# Patient Record
Sex: Male | Born: 1990
Health system: Southern US, Community
[De-identification: ages and names within clinical notes are randomized; demographics above are authoritative.]

## PROBLEM LIST (undated history)

## (undated) DIAGNOSIS — C801 Malignant (primary) neoplasm, unspecified: Secondary | ICD-10-CM

## (undated) DIAGNOSIS — K219 Gastro-esophageal reflux disease without esophagitis: Secondary | ICD-10-CM

## (undated) DIAGNOSIS — I10 Essential (primary) hypertension: Secondary | ICD-10-CM

---

## 2020-01-23 ENCOUNTER — Other Ambulatory Visit: Payer: Self-pay

## 2020-01-23 ENCOUNTER — Emergency Department (HOSPITAL_COMMUNITY): Payer: HRSA Program

## 2020-01-23 ENCOUNTER — Encounter (HOSPITAL_COMMUNITY): Payer: Self-pay | Admitting: Emergency Medicine

## 2020-01-23 ENCOUNTER — Emergency Department (HOSPITAL_COMMUNITY)
Admission: EM | Admit: 2020-01-23 | Discharge: 2020-01-23 | Disposition: A | Discharge status: Home / Self Care | Payer: HRSA Program | Attending: Emergency Medicine | Admitting: Emergency Medicine

## 2020-01-23 DIAGNOSIS — U071 COVID-19: Secondary | ICD-10-CM | POA: Diagnosis not present

## 2020-01-23 DIAGNOSIS — R05 Cough: Secondary | ICD-10-CM | POA: Diagnosis present

## 2020-01-23 LAB — POC SARS CORONAVIRUS 2 AG -  ED: SARS Coronavirus 2 Ag: POSITIVE — AB

## 2020-01-23 MED ORDER — ALBUTEROL SULFATE HFA 108 (90 BASE) MCG/ACT IN AERS
4.0000 | INHALATION_SPRAY | Freq: Once | RESPIRATORY_TRACT | Status: AC
  Administered 2020-01-23: 4 via RESPIRATORY_TRACT
  Filled 2020-01-23: qty 6.7

## 2020-01-23 MED ORDER — DM-GUAIFENESIN ER 30-600 MG PO TB12
1.0000 | ORAL_TABLET | Freq: Two times a day (BID) | ORAL | 0 refills | Status: DC
Start: 2020-01-23 — End: 2020-10-15

## 2020-01-23 MED ORDER — ACETAMINOPHEN ER 650 MG PO TBCR
650.0000 mg | EXTENDED_RELEASE_TABLET | Freq: Three times a day (TID) | ORAL | 0 refills | Status: DC | PRN
Start: 2020-01-23 — End: 2020-11-22

## 2020-01-23 MED ORDER — ONDANSETRON 8 MG PO TBDP
8.0000 mg | ORAL_TABLET | Freq: Three times a day (TID) | ORAL | 0 refills | Status: DC | PRN
Start: 2020-01-23 — End: 2020-10-15

## 2020-01-23 MED ORDER — ASPIRIN 81 MG PO CHEW: 81.0000 mg | CHEWABLE_TABLET | Freq: Every day | ORAL | 0 refills | Status: DC

## 2020-01-23 MED ORDER — ACETAMINOPHEN 325 MG PO TABS
650.0000 mg | ORAL_TABLET | Freq: Once | ORAL | Status: AC | PRN
  Administered 2020-01-23: 650 mg via ORAL
  Filled 2020-01-23: qty 2

## 2020-01-23 NOTE — ED Provider Notes (Signed)
[facility] EMERGENCY DEPARTMENT Provider Note   CSN: [id number] Arrival date & time: 01/23/20  [id number]     History Chief Complaint  Patient presents with  . Cough  . Fever    Steven Martinez is a 29 y.o. male.  HPI    Translation service was utilized.  29 year old male comes in a chief complaint of cough. Patient reports that he has been sick for the last 3 days.  Speaking with his wife, it seems like patient has been sick for more than a week, however he started getting worse over the last 2 3 days.  Patient is complaining of cough, body aches, fevers and chills.  No nausea, vomiting, diarrhea.  Patient denies any sick contacts.  Wife works at a facility where she is required to be tested daily, and she states that her test has been negative.  History reviewed. No pertinent past medical history.  There are no problems to display for this patient.   History reviewed. No pertinent surgical history.     No family history on file.  Social History   Tobacco Use  . Smoking status: Not on file  Substance Use Topics  . Alcohol use: Not on file  . Drug use: Not on file    Home Medications Prior to Admission medications   Medication Sig Start Date End Date Taking? Authorizing Provider  acetaminophen (TYLENOL 8 HOUR) 650 MG CR tablet Take 1 tablet (650 mg total) by mouth every 8 (eight) hours as needed for pain or fever. 01/23/20   Varney Biles, MD  aspirin 81 MG chewable tablet Chew 1 tablet (81 mg total) by mouth daily. 01/23/20   Varney Biles, MD  dextromethorphan-guaiFENesin (MUCINEX DM) 30-600 MG 12hr tablet Take 1 tablet by mouth 2 (two) times daily. 01/23/20   Varney Biles, MD  ondansetron (ZOFRAN ODT) 8 MG disintegrating tablet Take 1 tablet (8 mg total) by mouth every 8 (eight) hours as needed for nausea. 01/23/20   Varney Biles, MD    Allergies    Patient has no known allergies.  Review of Systems   Review of Systems  Constitutional:  Positive for activity change.  Respiratory: Positive for cough. Negative for shortness of breath.   Cardiovascular: Negative for chest pain.  Gastrointestinal: Negative for nausea and vomiting.  Musculoskeletal: Positive for arthralgias and myalgias.  Allergic/Immunologic: Negative for immunocompromised state.  All other systems reviewed and are negative.   Physical Exam Updated Vital Signs BP 116/83   Pulse 85   Temp (!) 101.7 F (38.7 C) (Oral)   Resp 18   Wt 77.1 kg   SpO2 99%   Physical Exam Vitals and nursing note reviewed.  Constitutional:      General: He is not in acute distress.    Appearance: He is well-developed. He is diaphoretic. He is not ill-appearing.  HENT:     Head: Atraumatic.  Cardiovascular:     Rate and Rhythm: Normal rate.  Pulmonary:     Effort: Pulmonary effort is normal.     Breath sounds: No wheezing.  Musculoskeletal:     Cervical back: Neck supple.  Skin:    General: Skin is warm.  Neurological:     Mental Status: He is alert and oriented to person, place, and time.     ED Results / Procedures / Treatments   Labs (all labs ordered are listed, but only abnormal results are displayed) Labs Reviewed  POC SARS CORONAVIRUS 2 AG -  ED -  Abnormal; Notable for the following components:      Result Value   SARS Coronavirus 2 Ag POSITIVE (*)    All other components within normal limits    EKG None  Radiology DG Chest 2 View  Result Date: 01/23/2020 CLINICAL DATA:  Cough, fever for 3 days. Additional history provided: Patient reports cough, fever, body aches for 3 days. EXAM: CHEST - 2 VIEW COMPARISON:  No pertinent prior studies available for comparison. FINDINGS: Heart size within normal limits. There is no appreciable airspace consolidation. No evidence of pleural effusion or pneumothorax. No acute bony abnormality identified. IMPRESSION: No evidence of acute cardiopulmonary abnormality. Electronically Signed   By: Kellie Simmering DO   On:  01/23/2020 09:08    Procedures Procedures (including critical care time)  Medications Ordered in ED Medications  albuterol (VENTOLIN HFA) 108 (90 Base) MCG/ACT inhaler 4 puff (has no administration in time range)  acetaminophen (TYLENOL) tablet 650 mg (650 mg Oral Given 01/23/20 0856)    ED Course  I have reviewed the triage vital signs and the nursing notes.  Pertinent labs & imaging results that were available during my care of the patient were reviewed by me and considered in my medical decision making (see chart for details).    MDM Rules/Calculators/A&P                      [name] was evaluated in Emergency Department on 01/23/2020 for the symptoms described in the history of present illness. He was evaluated in the context of the global COVID-19 pandemic, which necessitated consideration that the patient might be at risk for infection with the SARS-CoV-2 virus that causes COVID-19. Institutional protocols and algorithms that pertain to the evaluation of patients at risk for COVID-19 are in a state of rapid change based on information released by regulatory bodies including the CDC and federal and state organizations. These policies and algorithms were followed during the patient's care in the ED.   29 year old comes in a chief complaint of cough, body aches, fevers and chills. He is Covid positive. It does not appear that there is any hemodynamic or respiratory compromise.  Patient has no GI symptoms.  I do not think any labs are needed at this time as is oral intake has been fine and is not having any dizziness, lightheadedness or any symptoms suggesting perfusion issues.  It seems like patient has been sick now for 7 to 10 days.  Wife has been contacted directly by me.  She gets daily Covid test because of her work, I advised that if she works in a high risk place then she needs to ensure that she is quarantining herself and that she follows the guidelines of her work for  preventing further exposure to high risk population.  Patient will be given symptomatic meds.  He will return to the ER if his symptoms get worse.  Final Clinical Impression(s) / ED Diagnoses Final diagnoses:  COVID-19    Rx / DC Orders ED Discharge Orders         Ordered    dextromethorphan-guaiFENesin (MUCINEX DM) 30-600 MG 12hr tablet  2 times daily     01/23/20 1046    acetaminophen (TYLENOL 8 HOUR) 650 MG CR tablet  Every 8 hours PRN     01/23/20 1046    ondansetron (ZOFRAN ODT) 8 MG disintegrating tablet  Every 8 hours PRN     01/23/20 1046    aspirin 81 MG  chewable tablet  Daily     01/23/20 1046           Varney Biles, MD 01/23/20 1109

## 2020-01-23 NOTE — Discharge Instructions (Addendum)
You have COVID-19. Take the medications prescribed for symptom control. Return to the ER if your symptoms get worse, such as trouble breathing, confusion, severe nausea or vomiting, dizziness or near fainting.

## 2020-01-23 NOTE — ED Triage Notes (Signed)
Pt reports cough, fever, body aches x3 days, denies any recent sick contacts.

## 2020-09-21 DIAGNOSIS — A048 Other specified bacterial intestinal infections: Secondary | ICD-10-CM

## 2020-09-21 HISTORY — DX: Other specified bacterial intestinal infections: A04.8

## 2020-10-13 ENCOUNTER — Encounter (HOSPITAL_COMMUNITY): Payer: Self-pay

## 2020-10-13 ENCOUNTER — Other Ambulatory Visit: Payer: Self-pay

## 2020-10-13 ENCOUNTER — Inpatient Hospital Stay (HOSPITAL_COMMUNITY)
Admission: EM | Admit: 2020-10-13 | Discharge: 2020-10-15 | DRG: 378 | Disposition: A | Discharge status: Home / Self Care | Payer: Self-pay | Attending: Internal Medicine | Admitting: Internal Medicine

## 2020-10-13 DIAGNOSIS — K920 Hematemesis: Secondary | ICD-10-CM

## 2020-10-13 DIAGNOSIS — K254 Chronic or unspecified gastric ulcer with hemorrhage: Principal | ICD-10-CM | POA: Diagnosis present

## 2020-10-13 DIAGNOSIS — D62 Acute posthemorrhagic anemia: Secondary | ICD-10-CM | POA: Diagnosis present

## 2020-10-13 DIAGNOSIS — F1010 Alcohol abuse, uncomplicated: Secondary | ICD-10-CM | POA: Diagnosis present

## 2020-10-13 DIAGNOSIS — R739 Hyperglycemia, unspecified: Secondary | ICD-10-CM | POA: Diagnosis present

## 2020-10-13 DIAGNOSIS — I10 Essential (primary) hypertension: Secondary | ICD-10-CM | POA: Diagnosis present

## 2020-10-13 DIAGNOSIS — Z20822 Contact with and (suspected) exposure to covid-19: Secondary | ICD-10-CM | POA: Diagnosis present

## 2020-10-13 DIAGNOSIS — E559 Vitamin D deficiency, unspecified: Secondary | ICD-10-CM | POA: Diagnosis present

## 2020-10-13 DIAGNOSIS — K219 Gastro-esophageal reflux disease without esophagitis: Secondary | ICD-10-CM | POA: Diagnosis present

## 2020-10-13 DIAGNOSIS — K921 Melena: Secondary | ICD-10-CM

## 2020-10-13 DIAGNOSIS — Z7982 Long term (current) use of aspirin: Secondary | ICD-10-CM

## 2020-10-13 DIAGNOSIS — K922 Gastrointestinal hemorrhage, unspecified: Secondary | ICD-10-CM

## 2020-10-13 HISTORY — DX: Gastro-esophageal reflux disease without esophagitis: K21.9

## 2020-10-13 HISTORY — DX: Gastrointestinal hemorrhage, unspecified: K92.2

## 2020-10-13 HISTORY — DX: Essential (primary) hypertension: I10

## 2020-10-13 HISTORY — DX: Alcohol abuse, uncomplicated: F10.10

## 2020-10-13 LAB — TYPE AND SCREEN
ABO/RH(D): O POS
Antibody Screen: NEGATIVE

## 2020-10-13 LAB — COMPREHENSIVE METABOLIC PANEL
ALT: 25 U/L (ref 0–44)
AST: 25 U/L (ref 15–41)
Albumin: 3.8 g/dL (ref 3.5–5.0)
Alkaline Phosphatase: 65 U/L (ref 38–126)
Anion gap: 6 (ref 5–15)
BUN: 22 mg/dL — ABNORMAL HIGH (ref 6–20)
CO2: 23 mmol/L (ref 22–32)
Calcium: 8.3 mg/dL — ABNORMAL LOW (ref 8.9–10.3)
Chloride: 106 mmol/L (ref 98–111)
Creatinine, Ser: 0.79 mg/dL (ref 0.61–1.24)
GFR, Estimated: >60 mL/min (ref >60)
Glucose, Bld: 160 mg/dL — ABNORMAL HIGH (ref 70–99)
Potassium: 3.5 mmol/L (ref 3.5–5.1)
Sodium: 135 mmol/L (ref 135–145)
Total Bilirubin: 0.4 mg/dL (ref 0.3–1.2)
Total Protein: 6.7 g/dL (ref 6.5–8.1)

## 2020-10-13 LAB — URINALYSIS, ROUTINE W REFLEX MICROSCOPIC
Bilirubin Urine: NEGATIVE
Glucose, UA: NEGATIVE mg/dL
Hgb urine dipstick: NEGATIVE
Ketones, ur: NEGATIVE mg/dL
Leukocytes,Ua: NEGATIVE
Nitrite: NEGATIVE
Protein, ur: NEGATIVE mg/dL
Specific Gravity, Urine: 1.015 (ref 1.005–1.030)
pH: 7 (ref 5.0–8.0)

## 2020-10-13 LAB — LIPASE, BLOOD: Lipase: 24 U/L (ref 11–51)

## 2020-10-13 LAB — CBC
HCT: 35.3 % — ABNORMAL LOW (ref 39.0–52.0)
Hemoglobin: 11.8 g/dL — ABNORMAL LOW (ref 13.0–17.0)
MCH: 28.3 pg (ref 26.0–34.0)
MCHC: 33.4 g/dL (ref 30.0–36.0)
MCV: 84.7 fL (ref 80.0–100.0)
Platelets: 226 10*3/uL (ref 150–400)
RBC: 4.17 MIL/uL — ABNORMAL LOW (ref 4.22–5.81)
RDW: 12.6 % (ref 11.5–15.5)
WBC: 10.3 10*3/uL (ref 4.0–10.5)
nRBC: 0 % (ref 0.0–0.2)

## 2020-10-13 LAB — PROTIME-INR
INR: 1.1 (ref 0.8–1.2)
Prothrombin Time: 13.4 seconds (ref 11.4–15.2)

## 2020-10-13 LAB — HEMOGLOBIN: Hemoglobin: 10.7 g/dL — ABNORMAL LOW (ref 13.0–17.0)

## 2020-10-13 LAB — HEMOGLOBIN AND HEMATOCRIT, BLOOD
HCT: 33.4 % — ABNORMAL LOW (ref 39.0–52.0)
Hemoglobin: 11.2 g/dL — ABNORMAL LOW (ref 13.0–17.0)

## 2020-10-13 LAB — APTT: aPTT: 26 seconds (ref 24–36)

## 2020-10-13 LAB — HEMATOCRIT: HCT: 31.9 % — ABNORMAL LOW (ref 39.0–52.0)

## 2020-10-13 LAB — MAGNESIUM: Magnesium: 1.9 mg/dL (ref 1.7–2.4)

## 2020-10-13 LAB — SARS CORONAVIRUS 2 BY RT PCR (HOSPITAL ORDER, PERFORMED IN ~~LOC~~ HOSPITAL LAB): SARS Coronavirus 2: NEGATIVE

## 2020-10-13 LAB — PHOSPHORUS: Phosphorus: 2.1 mg/dL — ABNORMAL LOW (ref 2.5–4.6)

## 2020-10-13 LAB — POC OCCULT BLOOD, ED: Fecal Occult Bld: POSITIVE — AB

## 2020-10-13 MED ORDER — THIAMINE HCL 100 MG PO TABS: 100.0000 mg | ORAL_TABLET | Freq: Every day | ORAL | Status: DC

## 2020-10-13 MED ORDER — ACETAMINOPHEN 325 MG PO TABS: 650.0000 mg | ORAL_TABLET | Freq: Four times a day (QID) | ORAL | Status: DC | PRN

## 2020-10-13 MED ORDER — ACETAMINOPHEN 650 MG RE SUPP: 650.0000 mg | Freq: Four times a day (QID) | RECTAL | Status: DC | PRN

## 2020-10-13 MED ORDER — PANTOPRAZOLE SODIUM 40 MG IV SOLR
40.0000 mg | INTRAVENOUS | Status: AC
  Administered 2020-10-13: 40 mg via INTRAVENOUS
  Filled 2020-10-13: qty 40

## 2020-10-13 MED ORDER — POTASSIUM PHOSPHATES 15 MMOLE/5ML IV SOLN
20.0000 mmol | Freq: Once | INTRAVENOUS | Status: AC
  Administered 2020-10-14: 20 mmol via INTRAVENOUS
  Filled 2020-10-13: qty 6.67

## 2020-10-13 MED ORDER — POTASSIUM CHLORIDE IN NACL 20-0.9 MEQ/L-% IV SOLN
INTRAVENOUS | Status: DC
  Filled 2020-10-13: qty 1000

## 2020-10-13 MED ORDER — PROCHLORPERAZINE EDISYLATE 10 MG/2ML IJ SOLN: 5.0000 mg | INTRAMUSCULAR | Status: DC | PRN

## 2020-10-13 MED ORDER — MAGNESIUM SULFATE 2 GM/50ML IV SOLN
2.0000 g | Freq: Once | INTRAVENOUS | Status: AC
  Administered 2020-10-14: 2 g via INTRAVENOUS
  Filled 2020-10-13: qty 50

## 2020-10-13 MED ORDER — SODIUM CHLORIDE 0.9 % IV SOLN
8.0000 mg/h | INTRAVENOUS | Status: DC
  Administered 2020-10-13 – 2020-10-14 (×2): 8 mg/h via INTRAVENOUS
  Filled 2020-10-13 (×5): qty 80

## 2020-10-13 MED ORDER — ONDANSETRON HCL 4 MG/2ML IJ SOLN
4.0000 mg | Freq: Once | INTRAMUSCULAR | Status: AC
  Administered 2020-10-13: 4 mg via INTRAVENOUS
  Filled 2020-10-13: qty 2

## 2020-10-13 MED ORDER — LORAZEPAM 2 MG/ML IJ SOLN: 0.0000 mg | Freq: Three times a day (TID) | INTRAMUSCULAR | Status: DC

## 2020-10-13 MED ORDER — ADULT MULTIVITAMIN W/MINERALS CH: 1.0000 | ORAL_TABLET | Freq: Every day | ORAL | Status: DC

## 2020-10-13 MED ORDER — LORAZEPAM 2 MG/ML IJ SOLN: 0.0000 mg | INTRAMUSCULAR | Status: DC

## 2020-10-13 MED ORDER — LORAZEPAM 1 MG PO TABS: 1.0000 mg | ORAL_TABLET | ORAL | Status: DC | PRN

## 2020-10-13 MED ORDER — FOLIC ACID 1 MG PO TABS: 1.0000 mg | ORAL_TABLET | Freq: Every day | ORAL | Status: DC

## 2020-10-13 MED ORDER — LORAZEPAM 2 MG/ML IJ SOLN: 1.0000 mg | INTRAMUSCULAR | Status: DC | PRN

## 2020-10-13 MED ORDER — SODIUM CHLORIDE 0.9 % IV BOLUS
1000.0000 mL | Freq: Once | INTRAVENOUS | Status: AC
  Administered 2020-10-13: 1000 mL via INTRAVENOUS

## 2020-10-13 MED ORDER — THIAMINE HCL 100 MG/ML IJ SOLN
100.0000 mg | Freq: Every day | INTRAMUSCULAR | Status: DC
  Administered 2020-10-14: 100 mg via INTRAVENOUS
  Filled 2020-10-13: qty 2

## 2020-10-13 NOTE — H&P (Addendum)
History and Physical    Steven Martinez [id number] DOB: [birth year] DOA: 10/13/2020  PCP: Patient, No Pcp Per  Patient coming from: Home.  I have personally briefly reviewed patient's old medical records in [place]  Chief Complaint: "Vomiting blood and black stools".  HPI: Steven Martinez is a 30 y.o. male with medical history significant of alcohol abuse who is coming to the emergency department with complaints of daily melena since Friday (10/11/2020) and 3 episodes of hematemesis since earlier today.  He has burning epigastric pain, which he has had for some time now, but is a lot more intense over the past 3 days.  He has not had hematemesis or melena before.  He drinks a 6 pack of beer daily and 12 cans daily during the weekend.  He drinks coffee on most days in the morning.  He denies NSAIDs or any other medication use.  He denies fever, chills, sore throat, rhinorrhea, wheezing or hemoptysis.  No chest pain, palpitations, diaphoresis, PND, orthopnea or pitting edema of the lower extremities.  Denies dysuria, frequency hematuria.  No polyuria, polydipsia, polyphagia or blurred vision.  ED Course: Initial vital signs were temperature 98.5 F, pulse 107, respiration 18, BP 144/95 mmHg and O2 sat 99% on room air.  The patient was given a 1000 mL LR bolus, Protonix bolus of 40 mg IVP x1 followed by continuous pantoprazole infusion and ondansetron 4 mg IVP x1.  Labwork: Urinalysis was unremarkable.  Fecal occult blood was positive.  CBC showed a white count of 10.3, hemoglobin 11.8 g/dL and platelets 12.6.   CMP shows a glucose of 160, BUN of 22 and calcium of 8.3 mg/dL, all other values were within expected range. Lipase was normal.  Phosphorus 2.1 and magnesium 1.9 mg/dL.  Review of Systems: As per HPI otherwise all other systems reviewed and are negative.  History reviewed. No pertinent past medical history.  History reviewed. No pertinent surgical history.  Social  History  reports that he has never smoked. He has never used smokeless tobacco. He reports current alcohol use of about 57.0 - 58.0 standard drinks of alcohol per week. He reports that he does not use drugs.  No Known Allergies  Family History  Problem Relation Age of Onset  . Diabetes Mellitus II Mother   . Hypertension Mother     Prior to Admission medications   Medication Sig Start Date End Date Taking? Authorizing Provider  omeprazole-sodium bicarbonate (ZEGERID) 40-1100 MG capsule Take 1 capsule by mouth daily before breakfast.   Yes [provider]  acetaminophen (TYLENOL 8 HOUR) 650 MG CR tablet Take 1 tablet (650 mg total) by mouth every 8 (eight) hours as needed for pain or fever. Patient not taking: No sig reported 01/23/20   Varney Biles, MD  aspirin 81 MG chewable tablet Chew 1 tablet (81 mg total) by mouth daily. Patient not taking: No sig reported 01/23/20   Varney Biles, MD  dextromethorphan-guaiFENesin [facility] DM) 30-600 MG 12hr tablet Take 1 tablet by mouth 2 (two) times daily. Patient not taking: No sig reported 01/23/20   Varney Biles, MD  ondansetron (ZOFRAN ODT) 8 MG disintegrating tablet Take 1 tablet (8 mg total) by mouth every 8 (eight) hours as needed for nausea. Patient not taking: No sig reported 01/23/20   Varney Biles, MD   Physical Exam: Vitals:   10/13/20 1830 10/13/20 1900 10/13/20 1930 10/13/20 2000  BP: 133/80 126/83 128/85 138/89  Pulse:   (!) 111 Marland Kitchen)  107  Resp:  14 17 18   Temp:      TempSrc:      SpO2:  100% 99% 99%  Weight:      Height:       Constitutional: NAD, calm, comfortable Eyes: PERRL, lids and conjunctivae normal ENMT: Mucous membranes are moist. Posterior pharynx clear of any exudate or lesions.  Neck: normal, supple, no masses, no thyromegaly Respiratory: clear to auscultation bilaterally, no wheezing, no crackles. Normal respiratory effort. No accessory muscle use.  Cardiovascular: Regular rate and rhythm, no  murmurs / rubs / gallops. No extremity edema. 2+ pedal pulses. No carotid bruits.  Abdomen: No distention.  Bowel sounds positive.  Soft, positive epigastric tenderness, no guarding or rebound, no masses palpated. No hepatosplenomegaly. Musculoskeletal: no clubbing / cyanosis. Good ROM, no contractures. Normal muscle tone.  Skin: no rashes, lesions, ulcers on limited dermatological examination. Neurologic: CN 2-12 grossly intact. Sensation intact, DTR normal. Strength 5/5 in all 4.  Psychiatric: Normal judgment and insight. Alert and oriented x 3. Normal mood.   Labs on Admission: I have personally reviewed following labs and imaging studies  CBC: Recent Labs  Lab 10/13/20 1238 10/13/20 1713  WBC 10.3  --   HGB 11.8* 11.2*  HCT 35.3* 33.4*  MCV 84.7  --   PLT 226  --     Basic Metabolic Panel: Recent Labs  Lab 10/13/20 1238  NA 135  K 3.5  CL 106  CO2 23  GLUCOSE 160*  BUN 22*  CREATININE 0.79  CALCIUM 8.3*  MG 1.9  PHOS 2.1*    GFR: Estimated Creatinine Clearance: 127.4 mL/min (by C-G formula based on SCr of 0.79 mg/dL).  Liver Function Tests: Recent Labs  Lab 10/13/20 1238  AST 25  ALT 25  ALKPHOS 65  BILITOT 0.4  PROT 6.7  ALBUMIN 3.8    Urine analysis:    Component Value Date/Time   COLORURINE STRAW (A) 10/13/2020 1631   APPEARANCEUR CLEAR 10/13/2020 1631   LABSPEC 1.015 10/13/2020 1631   [place] 7.0 10/13/2020 [place] 10/13/2020 1631   HGBUR NEGATIVE 10/13/2020 1631   BILIRUBINUR NEGATIVE 10/13/2020 1631   KETONESUR NEGATIVE 10/13/2020 1631   PROTEINUR NEGATIVE 10/13/2020 1631   NITRITE NEGATIVE 10/13/2020 1631   LEUKOCYTESUR NEGATIVE 10/13/2020 1631   Radiological Exams on Admission: No results found.  EKG: Independently reviewed.   Assessment/Plan Principal Problem:   Acute upper GI bleed Observation/stepdown. Keep NPO. Continue IV fluids. Continue pantoprazole infusion. Antiemetics as needed. Monitor hematocrit  and hemoglobin. GI consult in the morning.  Active Problems:   Acute blood loss anemia Monitor H&H. Transfuse as needed.    Alcohol abuse Lorazepam by CIWA protocol. B1, folate and MVI supplementation. I urged the patient to consider alcohol cessation. Consult TOC in the morning.Marland Kitchen    Hyperglycemia Obtain fasting glucose in AM. If elevated, check hemoglobin A1c.    Hypocalcemia Repeat calcium level in AM. Check PTH and vitamin D level.    Hypophosphatemia  Alcohol use? Vitamin D deficiency? Currently NPO. Deferred oral supplementation due to gastritis. IV KPhos not available, will replace in the morning.    DVT prophylaxis: SCDs. Code Status:   Full code. Family Communication:  His wife was present in the ED room. Disposition Plan:   Patient is from:  Home.  Anticipated DC to:  Home.  Anticipated DC date:  10/15/2020.  Anticipated DC barriers: Clinical status. Consults called:  Routine gastroenterology consult. Admission status:  Observation/stepdown.  Severity  of Illness:  High due to acute upper GI bleed in the setting of daily alcohol and coffee consumption.  The patient has acute blood loss anemia.  He will be admitted for continuous H&H monitoring, Protonix infusion and GI evaluation and possible upper endoscopy tomorrow.  Although the patient does not have any symptoms of alcohol withdrawal at this time, CIWA protocol has also been started preemptively.  Reubin Milan MD [facility]  How to contact the [facility] Attending or Consulting provider [place] or covering provider during after hours [place], for this patient?   1. Check the care team in [facility] and look for a) attending/consulting [facility] provider listed and b) the [facility] team listed 2. Log into [email or web address] and use [place]'s universal password to access. If you do not have the password, please contact the hospital operator. 3. Locate the [facility] provider you are looking for under [facility] and page  to a number that you can be directly reached. 4. If you still have difficulty reaching the provider, please page the [facility] (Director on Call) for the Hospitalists listed on amion for assistance.  10/13/2020, 11:14 PM   This document was prepared using Dragon voice recognition software and may contain some unintended transcription errors.

## 2020-10-13 NOTE — ED Provider Notes (Signed)
[facility] EMERGENCY DEPARTMENT Provider Note   CSN: [id number] Arrival date & time: 10/13/20  1107     History Chief Complaint  Patient presents with  . Hematemesis    Steven Martinez is a 30 y.o. male who presents for epigastric pain and hematemesis.  The patient has been having problems with reflux for about 1 year.  He frequently has problems especially at night.  He has been having on and off epigastric pain which became fairly severe a couple days ago.  The patient noticed that he is having dark stools starting about 2 days ago.  He had pretty severe abdominal pain yesterday and took an over-the-counter Zegerid with only moderate relief of his symptoms.  This morning he began vomiting and had multiple episodes of bright red blood.  He denies any active abdominal pain but does feel a burning sensation in his abdomen.  The patient drinks about 2 tall boys every day when he gets off work and over the weekend he drinks with his brother and drinks up to 2 cases of beer over the weekend.  The patient does not use anti-inflammatory medications regularly.  He does drink coffee.  HPI     History reviewed. No pertinent past medical history.  There are no problems to display for this patient.   History reviewed. No pertinent surgical history.     History reviewed. No pertinent family history.  Social History   Tobacco Use  . Smoking status: Never Smoker  . Smokeless tobacco: Never Used  Substance Use Topics  . Alcohol use: Yes  . Drug use: Never    Home Medications Prior to Admission medications   Medication Sig Start Date End Date Taking? Authorizing Provider  acetaminophen (TYLENOL 8 HOUR) 650 MG CR tablet Take 1 tablet (650 mg total) by mouth every 8 (eight) hours as needed for pain or fever. 01/23/20   Varney Biles, MD  aspirin 81 MG chewable tablet Chew 1 tablet (81 mg total) by mouth daily. 01/23/20   Varney Biles, MD  dextromethorphan-guaiFENesin (MUCINEX DM)  30-600 MG 12hr tablet Take 1 tablet by mouth 2 (two) times daily. 01/23/20   Varney Biles, MD  ondansetron (ZOFRAN ODT) 8 MG disintegrating tablet Take 1 tablet (8 mg total) by mouth every 8 (eight) hours as needed for nausea. 01/23/20   Varney Biles, MD    Allergies    Patient has no known allergies.  Review of Systems   Review of Systems Ten systems reviewed and are negative for acute change, except as noted in the HPI.  Physical Exam Updated Vital Signs BP (!) 144/95   Pulse (!) 107   Temp 98.5 F (36.9 C) (Oral)   Resp 18   Ht 5\' 7"  (1.702 m)   Wt 77.1 kg   SpO2 99%   BMI 26.63 kg/m   Physical Exam Vitals and nursing note reviewed.  Constitutional:      General: He is not in acute distress.    Appearance: He is well-developed and well-nourished. He is not diaphoretic.  HENT:     Head: Normocephalic and atraumatic.  Eyes:     General: No scleral icterus.    Extraocular Movements: Extraocular movements intact.     Pupils: Pupils are equal, round, and reactive to light.     Comments: Conjunctivae pale  Cardiovascular:     Rate and Rhythm: Regular rhythm. Tachycardia present.     Heart sounds: Normal heart sounds.  Pulmonary:     Effort:  Pulmonary effort is normal. No respiratory distress.     Breath sounds: Normal breath sounds.  Abdominal:     General: There is no distension.     Palpations: Abdomen is soft.     Tenderness: There is no abdominal tenderness.  Musculoskeletal:        General: No edema.     Cervical back: Normal range of motion and neck supple.  Skin:    General: Skin is warm and dry.  Neurological:     Mental Status: He is alert.  Psychiatric:        Behavior: Behavior normal.     ED Results / Procedures / Treatments   Labs (all labs ordered are listed, but only abnormal results are displayed) Labs Reviewed  COMPREHENSIVE METABOLIC PANEL - Abnormal; Notable for the following components:      Result Value   Glucose, Bld 160 (*)    BUN  22 (*)    Calcium 8.3 (*)    All other components within normal limits  CBC - Abnormal; Notable for the following components:   RBC 4.17 (*)    Hemoglobin 11.8 (*)    HCT 35.3 (*)    All other components within normal limits  LIPASE, BLOOD  URINALYSIS, ROUTINE W REFLEX MICROSCOPIC  POC OCCULT BLOOD, ED  TYPE AND SCREEN    EKG None  Radiology No results found.  Procedures Procedures (including critical care time)  Medications Ordered in ED Medications  pantoprazole (PROTONIX) injection 40 mg (has no administration in time range)  sodium chloride 0.9 % bolus 1,000 mL (has no administration in time range)  ondansetron (ZOFRAN) injection 4 mg (has no administration in time range)    ED Course  I have reviewed the triage vital signs and the nursing notes.  Pertinent labs & imaging results that were available during my care of the patient were reviewed by me and considered in my medical decision making (see chart for details).    MDM Rules/Calculators/A&P                          CC: hematemesis VS: BP 126/83   Pulse (!) 107   Temp 99 F (37.2 C) (Oral)   Resp 14   Ht 5\' 7"  (1.702 m)   Wt 77.1 kg   SpO2 100%   BMI 26.63 kg/m   [id number] is gathered by patient via his wife as Optometrist. Previous records obtained and reviewed. DDX:The patient's complaint of hematemesis involves an extensive number of diagnostic and treatment options, and is a complaint that carries with it a high risk of complications, morbidity, and potential mortality. Given the large differential diagnosis, medical decision making is of high complexity.  Bleeding ulcer, Mallory-Weiss tear, less likely [place] Labs: I ordered reviewed and interpreted labs which include CBC which shows hemoglobin of 11.8, CMP shows blood glucose of 160 without other acute abnormality.  Urine is negative.  Fecal occult positive with obvious melanotic stool on exam.  Repeat hemoglobin shows a drop of 0.4 on his  hemoglobin Imaging:  EKG: Consults:Dr. Rehman of GI who asks that the patient remain NPO and will undergo EGD tomorrow. MDM: Patient here with hematemesis.  Suspect bleeding gastric ulcer given his history of chronic reflux symptoms and alcohol abuse.  Patient is stable throughout his ED visit and will be admitted by Dr. Olevia Bowens. Patient disposition:The patient appears reasonably stabilized for admission considering the current resources, flow, and capabilities available in  the ED at this time, and I doubt any other [facility] requiring further screening and/or treatment in the ED prior to admission.        Final Clinical Impression(s) / ED Diagnoses Final diagnoses:  None    Rx / DC Orders ED Discharge Orders    None       Margarita Mail, PA-C 10/13/20 2211    Sherwood Gambler, MD 10/15/20 [phone]

## 2020-10-13 NOTE — ED Triage Notes (Signed)
Pt to er, wife with pt helping to interpret, offered an interpreter, pt requests wife to interpret, states that pt vomits every morning for the past few months states that he hasn't been to a doctor for this, pt states that he started vomiting blood this am.  Pt has emesis bag with bright red blood.  Pt appears pale.

## 2020-10-13 NOTE — ED Notes (Signed)
Called [facility] for Protonix IVF

## 2020-10-14 ENCOUNTER — Encounter (HOSPITAL_COMMUNITY)
Admission: EM | Disposition: A | Discharge status: Home / Self Care | Payer: Self-pay | Source: Home / Self Care | Attending: Family Medicine

## 2020-10-14 ENCOUNTER — Observation Stay (HOSPITAL_COMMUNITY): Payer: Self-pay | Admitting: Anesthesiology

## 2020-10-14 ENCOUNTER — Encounter (HOSPITAL_COMMUNITY): Payer: Self-pay | Admitting: Internal Medicine

## 2020-10-14 ENCOUNTER — Other Ambulatory Visit: Payer: Self-pay

## 2020-10-14 DIAGNOSIS — D62 Acute posthemorrhagic anemia: Secondary | ICD-10-CM

## 2020-10-14 DIAGNOSIS — R739 Hyperglycemia, unspecified: Secondary | ICD-10-CM

## 2020-10-14 DIAGNOSIS — F1010 Alcohol abuse, uncomplicated: Secondary | ICD-10-CM

## 2020-10-14 DIAGNOSIS — E559 Vitamin D deficiency, unspecified: Secondary | ICD-10-CM | POA: Diagnosis present

## 2020-10-14 HISTORY — PX: ESOPHAGOGASTRODUODENOSCOPY (EGD) WITH PROPOFOL: SHX5813

## 2020-10-14 LAB — CBC
HCT: 29.1 % — ABNORMAL LOW (ref 39.0–52.0)
Hemoglobin: 9.6 g/dL — ABNORMAL LOW (ref 13.0–17.0)
MCH: 28.4 pg (ref 26.0–34.0)
MCHC: 33 g/dL (ref 30.0–36.0)
MCV: 86.1 fL (ref 80.0–100.0)
Platelets: 209 10*3/uL (ref 150–400)
RBC: 3.38 MIL/uL — ABNORMAL LOW (ref 4.22–5.81)
RDW: 13 % (ref 11.5–15.5)
WBC: 6 10*3/uL (ref 4.0–10.5)
nRBC: 0 % (ref 0.0–0.2)

## 2020-10-14 LAB — COMPREHENSIVE METABOLIC PANEL
ALT: 19 U/L (ref 0–44)
AST: 16 U/L (ref 15–41)
Albumin: 3.3 g/dL — ABNORMAL LOW (ref 3.5–5.0)
Alkaline Phosphatase: 56 U/L (ref 38–126)
Anion gap: 3 — ABNORMAL LOW (ref 5–15)
BUN: 18 mg/dL (ref 6–20)
CO2: 26 mmol/L (ref 22–32)
Calcium: 8.2 mg/dL — ABNORMAL LOW (ref 8.9–10.3)
Chloride: 109 mmol/L (ref 98–111)
Creatinine, Ser: 0.86 mg/dL (ref 0.61–1.24)
GFR, Estimated: >60 mL/min (ref >60)
Glucose, Bld: 115 mg/dL — ABNORMAL HIGH (ref 70–99)
Potassium: 4.4 mmol/L (ref 3.5–5.1)
Sodium: 138 mmol/L (ref 135–145)
Total Bilirubin: 0.5 mg/dL (ref 0.3–1.2)
Total Protein: 6 g/dL — ABNORMAL LOW (ref 6.5–8.1)

## 2020-10-14 LAB — MRSA PCR SCREENING: MRSA by PCR: NEGATIVE

## 2020-10-14 LAB — HIV ANTIBODY (ROUTINE TESTING W REFLEX): HIV Screen 4th Generation wRfx: NONREACTIVE

## 2020-10-14 LAB — VITAMIN D 25 HYDROXY (VIT D DEFICIENCY, FRACTURES): Vit D, 25-Hydroxy: 15.59 ng/mL — ABNORMAL LOW (ref 30–100)

## 2020-10-14 SURGERY — ESOPHAGOGASTRODUODENOSCOPY (EGD) WITH PROPOFOL
Anesthesia: General

## 2020-10-14 MED ORDER — LIDOCAINE HCL (CARDIAC) PF 100 MG/5ML IV SOSY
PREFILLED_SYRINGE | INTRAVENOUS | Status: DC | PRN
  Administered 2020-10-14: 100 mg via INTRAVENOUS

## 2020-10-14 MED ORDER — MAGNESIUM SULFATE 4 GM/100ML IV SOLN
4.0000 g | Freq: Once | INTRAVENOUS | Status: AC
  Administered 2020-10-14: 4 g via INTRAVENOUS
  Filled 2020-10-14: qty 100

## 2020-10-14 MED ORDER — CALCIUM GLUCONATE-NACL 1-0.675 GM/50ML-% IV SOLN: 1.0000 g | Freq: Once | INTRAVENOUS | Status: DC

## 2020-10-14 MED ORDER — VITAMIN D (ERGOCALCIFEROL) 1.25 MG (50000 UNIT) PO CAPS
50000.0000 [IU] | ORAL_CAPSULE | ORAL | Status: DC
  Administered 2020-10-14: 50000 [IU] via ORAL
  Filled 2020-10-14: qty 1

## 2020-10-14 MED ORDER — MIDAZOLAM HCL 2 MG/2ML IJ SOLN
INTRAMUSCULAR | Status: AC
  Filled 2020-10-14: qty 2

## 2020-10-14 MED ORDER — MIDAZOLAM HCL 2 MG/2ML IJ SOLN
INTRAMUSCULAR | Status: DC | PRN
  Administered 2020-10-14: 2 mg via INTRAVENOUS

## 2020-10-14 MED ORDER — PROPOFOL 10 MG/ML IV BOLUS
INTRAVENOUS | Status: DC | PRN
  Administered 2020-10-14: 100 mg via INTRAVENOUS

## 2020-10-14 MED ORDER — SODIUM CHLORIDE 0.9 % IV SOLN: INTRAVENOUS | Status: DC

## 2020-10-14 MED ORDER — EPINEPHRINE 1 MG/10ML IJ SOSY
PREFILLED_SYRINGE | INTRAMUSCULAR | Status: AC
  Filled 2020-10-14: qty 10

## 2020-10-14 MED ORDER — CHLORHEXIDINE GLUCONATE CLOTH 2 % EX PADS
6.0000 | MEDICATED_PAD | Freq: Every day | CUTANEOUS | Status: DC
  Administered 2020-10-14: 6 via TOPICAL

## 2020-10-14 MED ORDER — STERILE WATER FOR IRRIGATION IR SOLN
Status: DC | PRN
  Administered 2020-10-14: 1.5 mL

## 2020-10-14 MED ORDER — PANTOPRAZOLE SODIUM 40 MG IV SOLR
40.0000 mg | Freq: Two times a day (BID) | INTRAVENOUS | Status: DC
  Administered 2020-10-14: 40 mg via INTRAVENOUS
  Filled 2020-10-14: qty 40

## 2020-10-14 MED ORDER — PROPOFOL 500 MG/50ML IV EMUL
INTRAVENOUS | Status: DC | PRN
  Administered 2020-10-14: 150 ug/kg/min via INTRAVENOUS

## 2020-10-14 MED ORDER — LIDOCAINE HCL (PF) 2 % IJ SOLN
INTRAMUSCULAR | Status: AC
  Filled 2020-10-14: qty 5

## 2020-10-14 MED ORDER — LACTATED RINGERS IV SOLN: INTRAVENOUS | Status: DC

## 2020-10-14 MED ORDER — SODIUM CHLORIDE (PF) 0.9 % IJ SOLN
PREFILLED_SYRINGE | INTRAMUSCULAR | Status: DC | PRN
  Administered 2020-10-14: 4 mL

## 2020-10-14 NOTE — Interval H&P Note (Signed)
History and Physical Interval Note:  10/14/2020 12:27 PM  [place]  has presented today for surgery, with the diagnosis of GI bleed, heme positive stool, acute blood loss anemia.  The various methods of treatment have been discussed with the patient and family. After consideration of risks, benefits and other options for treatment, the patient has consented to  Procedure(s): ESOPHAGOGASTRODUODENOSCOPY (EGD) WITH PROPOFOL (N/A) as a surgical intervention.  The patient's history has been reviewed, patient examined, no change in status, stable for surgery.  I have reviewed the patient's chart and labs.  Questions were answered to the patient's satisfaction.     Eloise Harman

## 2020-10-14 NOTE — Anesthesia Postprocedure Evaluation (Signed)
Anesthesia Post Note  Patient: Steven Martinez  Procedure(s) Performed: ESOPHAGOGASTRODUODENOSCOPY (EGD) WITH PROPOFOL (N/A )  Patient location during evaluation: Phase II Anesthesia Type: General Level of consciousness: awake and oriented Pain management: satisfactory to patient Vital Signs Assessment: post-procedure vital signs reviewed and stable Respiratory status: spontaneous breathing and respiratory function stable Cardiovascular status: blood pressure returned to baseline and stable Postop Assessment: no apparent nausea or vomiting Anesthetic complications: no   No complications documented.   Last Vitals:  Vitals:   10/14/20 1111 10/14/20 1300  BP: (!) 145/86 108/75  Pulse: 98 90  Resp: 19 (!) 24  Temp: 36.8 C 37 C  SpO2: 98% 97%    Last Pain:  Vitals:   10/14/20 1300  TempSrc: Oral  PainSc: 0-No pain                 Karna Dupes

## 2020-10-14 NOTE — Anesthesia Preprocedure Evaluation (Signed)
Anesthesia Evaluation  Patient identified by MRN, date of birth, ID band Patient awake    Reviewed: Allergy & Precautions, H&P , NPO status , Patient's Chart, lab work & pertinent test results, reviewed documented beta blocker date and time   Airway Mallampati: II  TM Distance: >3 FB Neck ROM: full    Dental no notable dental hx. (+) Teeth Intact   Pulmonary neg pulmonary ROS,    Pulmonary exam normal breath sounds clear to auscultation       Cardiovascular Exercise Tolerance: Good hypertension, negative cardio ROS   Rhythm:regular Rate:Normal     Neuro/Psych PSYCHIATRIC DISORDERS negative neurological ROS     GI/Hepatic GERD  Medicated,(+)     substance abuse  alcohol use,   Endo/Other  negative endocrine ROS  Renal/GU negative Renal ROS  negative genitourinary   Musculoskeletal   Abdominal   Peds  Hematology  (+) Blood dyscrasia, anemia ,   Anesthesia Other Findings   Reproductive/Obstetrics negative OB ROS                             Anesthesia Physical Anesthesia Plan  ASA: IV and emergent  Anesthesia Plan: General   Post-op Pain Management:    Induction:   PONV Risk Score and Plan:   Airway Management Planned:   Additional Equipment:   Intra-op Plan:   Post-operative Plan:   Informed Consent: I have reviewed the patients History and Physical, chart, labs and discussed the procedure including the risks, benefits and alternatives for the proposed anesthesia with the patient or authorized representative who has indicated his/her understanding and acceptance.     Dental Advisory Given  Plan Discussed with: CRNA  Anesthesia Plan Comments:         Anesthesia Quick Evaluation

## 2020-10-14 NOTE — Op Note (Signed)
[facility] Patient Name: Steven Martinez Procedure Date: 10/14/2020 12:17 PM MRN: [id number] Date of Birth: [birth year] Attending MD: Elon Alas. [name] DO CSN: [id number] Age: 30 Admit Type: Outpatient Procedure:                Upper GI endoscopy Indications:              Acute post hemorrhagic anemia, Hematemesis Providers:                Elon Alas. [name], DO, Lambert Mody, Aram Candela Referring MD:              Medicines:                See the Anesthesia note for documentation of the                            administered medications Complications:            No immediate complications. Estimated Blood Loss:     Estimated blood loss was minimal. Procedure:                Pre-Anesthesia Assessment:                           - The anesthesia plan was to use monitored                            anesthesia care (MAC).                           After obtaining informed consent, the endoscope was                            passed under direct vision. Throughout the                            procedure, the patient's blood pressure, pulse, and                            oxygen saturations were monitored continuously. The                            [id number]-H190 ([id number]) scope was introduced through the                            mouth, and advanced to the second part of duodenum.                            The upper GI endoscopy was accomplished without                            difficulty. The patient tolerated the procedure                            well. Scope In: 12:43:40  PM Scope Out: 12:54:33 PM Total Procedure Duration: 0 hours 10 minutes 53 seconds  Findings:      There is no endoscopic evidence of Barrett's esophagus, bleeding,       esophagitis, ulcerations or varices in the entire esophagus.      One non-obstructing non-bleeding cratered gastric ulcer with a visible       vessel was found in the gastric antrum. The lesion was 15 mm  in largest       dimension. There is no evidence of perforation. Coagulation for bleeding       prevention using bipolar probe was successful.      The duodenal bulb, first portion of the duodenum and second portion of       the duodenum were normal. Impression:               - Non-obstructing non-bleeding gastric ulcer with a                            visible vessel. There is no evidence of                            perforation. Treated with bipolar cautery.                           - Normal duodenal bulb, first portion of the                            duodenum and second portion of the duodenum.                           - No specimens collected. Moderate Sedation:      Per Anesthesia Care Recommendation:           - Return patient to hospital ward for ongoing care.                           - Soft diet.                           - Continue on IV PPI BID while inpatient. Discharge                            home on Omeprazole 40 mg BID. Avoid NSAIDs. Will                            need repeat EGD in 8 weeks to evaluate healing. Procedure Code(s):        --- Professional ---                           [phone], Esophagogastroduodenoscopy, flexible,                            transoral; with control of bleeding, any method Diagnosis Code(s):        --- Professional ---                           K25.4,  Chronic or unspecified gastric ulcer with                            hemorrhage                           D62, Acute posthemorrhagic anemia                           K92.0, Hematemesis CPT copyright 2019 American Medical Association. All rights reserved. The codes documented in this report are preliminary and upon coder review may  be revised to meet current compliance requirements. Elon Alas. [name], DO [place] [name], DO 10/14/2020 1:07:17 PM This report has been signed electronically. Number of Addenda: 0

## 2020-10-14 NOTE — Progress Notes (Signed)
PROGRESS NOTE   Steven Martinez  [id number] DOB: July 12, 1991 DOA: 10/13/2020 PCP: Patient, No Pcp Per   Chief Complaint  Patient presents with  . Hematemesis   Level of care: Stepdown  Brief Admission History:  30 y.o. male with medical history significant of alcohol abuse who is coming to the emergency department with complaints of daily melena since Friday (10/11/2020) and 3 episodes of hematemesis since earlier today.  He has burning epigastric pain, which he has had for some time now, but is a lot more intense over the past 3 days.  He has not had hematemesis or melena before.  He drinks a 6 pack of beer daily and 12 cans daily during the weekend.  He drinks coffee on most days in the morning.  He was admitted for acute Upper GI bleed and started on IV protonix.    Assessment & Plan:   Principal Problem:   Acute upper GI bleed Active Problems:   Acute blood loss anemia   Hyperglycemia   Hypocalcemia   Hypophosphatemia   Alcohol abuse  1. Acute upper GI bleed - Appreciate GI consultation.  Continue IV protonix.  Continue NPO status.  Tentative plan for EGD later today with Dr. [name].   2. Alcohol abuse - Pt is on CIWA protocol.  Counseled on cessation.  TOC consult for outpatient treatment resources.  3. Hypocalemia- Calcium gluconate 1 gm IV ordered.  4. Hypophos - IV replacement ordered.    DVT prophylaxis: SCDs Code Status: full  Family Communication: updated at bedside  Disposition:  Status is: Observation  The patient remains OBS appropriate and will d/c before 2 midnights.  Dispo: The patient is from: Home              Anticipated d/c is to: Home              Anticipated d/c date is: 1 day              Patient currently is medically stable to d/c.   Difficult to place patient No   Consultants:   GI  Procedures:   EGD 1/24 >>  Antimicrobials:     Subjective: Pt reports no further emesis this morning.   Objective: Vitals:   10/14/20 0800  10/14/20 0812 10/14/20 0900 10/14/20 1000  BP: (!) 143/86  119/81 129/81  Pulse: 79 81 80 80  Resp: 16 (!) 9 19 (!) 22  Temp:  98.6 F (37 C)    TempSrc:  Oral    SpO2: 98% 98% 97% 96%  Weight:      Height:        Intake/Output Summary (Last 24 hours) at 10/14/2020 1017 Last data filed at 10/14/2020 0819 Gross per 24 hour  Intake 1909.23 ml  Output 350 ml  Net 1559.23 ml   Filed Weights   10/13/20 1204 10/14/20 0500  Weight: 77.1 kg 80.7 kg    Examination:  General exam: Appears calm and comfortable  Respiratory system: Clear to auscultation. Respiratory effort normal. Cardiovascular system: normal S1 & S2 heard. No JVD, murmurs, rubs, gallops or clicks. No pedal edema. Gastrointestinal system: Abdomen is nondistended, soft and nontender. No organomegaly or masses felt. Normal bowel sounds heard. Central nervous system: Alert and oriented. No focal neurological deficits. Extremities: Symmetric 5 x 5 power. Skin: No rashes, lesions or ulcers Psychiatry: Judgement and insight appear normal. Mood & affect appropriate.   Data Reviewed: I have personally reviewed following labs and imaging studies  CBC:  Recent Labs  Lab 10/13/20 1238 10/13/20 1713 10/13/20 2226 10/14/20 0745  WBC 10.3  --   --  6.0  HGB 11.8* 11.2* 10.7* 9.6*  HCT 35.3* 33.4* 31.9* 29.1*  MCV 84.7  --   --  86.1  PLT 226  --   --  419    Basic Metabolic Panel: Recent Labs  Lab 10/13/20 1238 10/14/20 0745  NA 135 138  K 3.5 4.4  CL 106 109  CO2 23 26  GLUCOSE 160* 115*  BUN 22* 18  CREATININE 0.79 0.86  CALCIUM 8.3* 8.2*  MG 1.9  --   PHOS 2.1*  --     GFR: Estimated Creatinine Clearance: 128.9 mL/min (by C-G formula based on SCr of 0.86 mg/dL).  Liver Function Tests: Recent Labs  Lab 10/13/20 1238 10/14/20 0745  AST 25 16  ALT 25 19  ALKPHOS 65 56  BILITOT 0.4 0.5  PROT 6.7 6.0*  ALBUMIN 3.8 3.3*    CBG: No results for input(s): GLUCAP in the last 168 hours.  Recent  Results (from the past 240 hour(s))  SARS Coronavirus 2 by RT PCR (hospital order, performed in [facility] hospital lab) Nasopharyngeal Nasopharyngeal Swab     Status: None   Collection Time: 10/13/20  7:54 PM   Specimen: Nasopharyngeal Swab  Result Value Ref Range Status   SARS Coronavirus 2 NEGATIVE NEGATIVE Final    Comment: (NOTE) SARS-CoV-2 target nucleic acids are NOT DETECTED.  The SARS-CoV-2 RNA is generally detectable in upper and lower respiratory specimens during the acute phase of infection. The lowest concentration of SARS-CoV-2 viral copies this assay can detect is 250 copies / mL. A negative result does not preclude SARS-CoV-2 infection and should not be used as the sole basis for treatment or other patient management decisions.  A negative result may occur with improper specimen collection / handling, submission of specimen other than nasopharyngeal swab, presence of viral mutation(s) within the areas targeted by this assay, and inadequate number of viral copies (<250 copies / mL). A negative result must be combined with clinical observations, patient history, and epidemiological information.  Fact Sheet for Patients:   StrictlyIdeas.no  Fact Sheet for Healthcare Providers: BankingDealers.co.za  This test is not yet approved or  cleared by the [place] FDA and has been authorized for detection and/or diagnosis of SARS-CoV-2 by FDA under an Emergency Use Authorization (EUA).  This EUA will remain in effect (meaning this test can be used) for the duration of the COVID-19 declaration under Section 564(b)(1) of the Act, 21 U.S.C. section 360bbb-3(b)(1), unless the authorization is terminated or revoked sooner.  Performed at [facility], [address], [place], [place] [address]   MRSA PCR Screening     Status: None   Collection Time: 10/13/20 11:02 PM   Specimen: Nasopharyngeal  Result Value Ref Range Status    MRSA by PCR NEGATIVE NEGATIVE Final    Comment:        The GeneXpert MRSA Assay (FDA approved for NASAL specimens only), is one component of a comprehensive MRSA colonization surveillance program. It is not intended to diagnose MRSA infection nor to guide or monitor treatment for MRSA infections. Performed at [facility], [address], [place], [place] [address]      Radiology Studies: No results found.  Scheduled Meds: . Chlorhexidine Gluconate Cloth  6 each Topical Daily  . folic acid  1 mg Oral Daily  . LORazepam  0-4 mg Intravenous Q4H  Followed by  . [START ON 10/16/2020] LORazepam  0-4 mg Intravenous Q8H  . multivitamin with minerals  1 tablet Oral Daily  . thiamine  100 mg Oral Daily   Or  . thiamine  100 mg Intravenous Daily   Continuous Infusions: . 0.9 % NaCl with KCl 20 mEq / L 125 mL/hr at 10/14/20 0610  . magnesium sulfate bolus IVPB 4 g (10/14/20 0903)  . pantoprozole (PROTONIX) infusion 8 mg/hr (10/14/20 0612)  . potassium PHOSPHATE IVPB (in mmol) 20 mmol (10/14/20 0922)    LOS: 0 days   Time spent: 36 mins   Everrett Lacasse Wynetta Emery, MD How to contact the [facility] Attending or Consulting provider [place] or covering provider during after hours [place], for this patient?  1. Check the care team in [facility] and look for a) attending/consulting [facility] provider listed and b) the [facility] team listed 2. Log into [email or web address] and use [place]'s universal password to access. If you do not have the password, please contact the hospital operator. 3. Locate the [facility] provider you are looking for under [facility] and page to a number that you can be directly reached. 4. If you still have difficulty reaching the provider, please page the [facility] (Director on Call) for the Hospitalists listed on amion for assistance.  10/14/2020, 10:17 AM

## 2020-10-14 NOTE — H&P (View-Only) (Signed)
Referring Provider: Margarita Mail, PA-C, [facility] Primary Care Physician:  Patient, No Pcp Per Primary Gastroenterologist:  Previously unassigned. Dr. [name]  Date of Admission: 10/13/20 Date of Consultation: 10/14/20  Reason for Consultation: Upper GI bleed   HPI:  Steven Martinez is a 30 y.o. year old male presenting to the ED yesterday after acute onset of hematemesis, reported melena starting on Friday, burning epigastric discomfort, found to be heme positive in the ED with Hgb 11.8. Unknown baseline Hgb. This morning, Hgb 9.6. Last episode of melena yesterday. No further hematemesis since arrival to ICU.   Reports acute onset on Saturday of N/V with bright red emesis, large amounts X 3. Burning upper abdomen for one month, worsened by coffee, soda, and greasy foods. No pain currently. Denies any NSAIDs. Drinks a "few" beers each night and heavy drinking over weekend per wife. No smoking or drug use. Chronic GERD, worsened nocturnally and waking patient up at night. No unexplained weight loss or lack of appetite. No prior EGD. Denies any known personal history of liver disease. INR normal at 1.1.  Per cousin at bedside and patient, he has not sought primary care in some time.   COVID negative this admission.   Past Medical History:  Diagnosis Date  . Acute upper GI bleed 10/13/2020  . Alcohol abuse 10/13/2020  . GERD (gastroesophageal reflux disease)   . HTN (hypertension)     Past Surgical History:  Procedure Laterality Date  . None      Prior to Admission medications   Medication Sig Start Date End Date Taking? Authorizing Provider  omeprazole-sodium bicarbonate (ZEGERID) 40-1100 MG capsule Take 1 capsule by mouth daily before breakfast.   Yes [provider]  acetaminophen (TYLENOL 8 HOUR) 650 MG CR tablet Take 1 tablet (650 mg total) by mouth every 8 (eight) hours as needed for pain or fever. Patient not taking: No sig reported 01/23/20   Varney Biles, MD   aspirin 81 MG chewable tablet Chew 1 tablet (81 mg total) by mouth daily. Patient not taking: No sig reported 01/23/20   Varney Biles, MD  dextromethorphan-guaiFENesin [facility] DM) 30-600 MG 12hr tablet Take 1 tablet by mouth 2 (two) times daily. Patient not taking: No sig reported 01/23/20   Varney Biles, MD  ondansetron (ZOFRAN ODT) 8 MG disintegrating tablet Take 1 tablet (8 mg total) by mouth every 8 (eight) hours as needed for nausea. Patient not taking: No sig reported 01/23/20   Varney Biles, MD    Current Facility-Administered Medications  Medication Dose Route Frequency Provider Last Rate Last Admin  . 0.9 % NaCl with KCl 20 mEq/ L  infusion   Intravenous Continuous Reubin Milan, MD 125 mL/hr at 10/14/20 0610 New Bag at 10/14/20 0610  . acetaminophen (TYLENOL) tablet 650 mg  650 mg Oral Q6H PRN Reubin Milan, MD       Or  . acetaminophen (TYLENOL) suppository 650 mg  650 mg Rectal Q6H PRN Reubin Milan, MD      . Chlorhexidine Gluconate Cloth 2 % PADS 6 each  6 each Topical Daily Murlean Iba, MD   6 each at 10/14/20 [phone]  . folic acid (FOLVITE) tablet 1 mg  1 mg Oral Daily Reubin Milan, MD      . LORazepam (ATIVAN) injection 0-4 mg  0-4 mg Intravenous Q4H Reubin Milan, MD       Followed by  . [START ON 10/16/2020] LORazepam (ATIVAN) injection 0-4 mg  0-4 mg Intravenous Q8H Reubin Milan, MD      . LORazepam (ATIVAN) tablet 1-4 mg  1-4 mg Oral Q1H PRN Reubin Milan, MD       Or  . LORazepam (ATIVAN) injection 1-4 mg  1-4 mg Intravenous Q1H PRN Reubin Milan, MD      . magnesium sulfate IVPB 4 g 100 mL  4 g Intravenous Once Wynetta Emery, Clanford L, MD 50 mL/hr at 10/14/20 0903 4 g at 10/14/20 0903  . multivitamin with minerals tablet 1 tablet  1 tablet Oral Daily Reubin Milan, MD      . pantoprazole (PROTONIX) 80 mg in sodium chloride 0.9 % 100 mL (0.8 mg/mL) infusion  8 mg/hr Intravenous Continuous Reubin Milan,  MD 10 mL/hr at 10/14/20 0612 8 mg/hr at 10/14/20 0612  . potassium PHOSPHATE 20 mmol in dextrose 5 % 500 mL infusion  20 mmol Intravenous Once Reubin Milan, MD 84 mL/hr at 10/14/20 0922 20 mmol at 10/14/20 0922  . prochlorperazine (COMPAZINE) injection 5 mg  5 mg Intravenous Q4H PRN Reubin Milan, MD      . thiamine tablet 100 mg  100 mg Oral Daily Reubin Milan, MD       Or  . thiamine (B-1) injection 100 mg  100 mg Intravenous Daily Reubin Milan, MD   100 mg at 10/14/20 5329    Allergies as of 10/13/2020  . (No Known Allergies)    Family History  Problem Relation Age of Onset  . Diabetes Mellitus II Mother   . Hypertension Mother   . Colon cancer Neg Hx   . Colon polyps Neg Hx     Social History   Socioeconomic History  . Marital status: Married    Spouse name: Not on file  . Number of children: Not on file  . Years of education: Not on file  . Highest education level: Not on file  Occupational History  . Not on file  Tobacco Use  . Smoking status: Never Smoker  . Smokeless tobacco: Never Used  Substance and Sexual Activity  . Alcohol use: Yes    Alcohol/week: 57.0 - 58.0 standard drinks    Types: 54 Cans of beer, 3 - 4 Shots of liquor per week    Comment: Drinks a sixpack daily and 12-15 beers on Saturdays and Sundays  . Drug use: Never  . Sexual activity: Not on file  Other Topics Concern  . Not on file  Social History Narrative  . Not on file   Social Determinants of Health   Financial Resource Strain: Not on file  Food Insecurity: Not on file  Transportation Needs: Not on file  Physical Activity: Not on file  Stress: Not on file  Social Connections: Not on file  Intimate Partner Violence: Not on file    Review of Systems: Gen: Denies fever, chills, loss of appetite, change in weight or weight loss CV: Denies chest pain, heart palpitations, syncope, edema  Resp: Denies shortness of breath with rest, cough, wheezing GI: see  HPI GU : Denies urinary burning, urinary frequency, urinary incontinence.  MS: Denies joint pain,swelling, cramping Derm: Denies rash, itching, dry skin Psych: Denies depression, anxiety,confusion, or memory loss Heme: see HPI  Physical Exam: Vital signs in last 24 hours: Temp:  [98.4 F (36.9 C)-99 F (37.2 C)] 98.6 F (37 C) (01/24 0812) Pulse Rate:  [79-111] 80 (01/24 0900) Resp:  [9-32] 19 (01/24 0900) BP: (117-149)/(66-101) 119/81 (  01/24 0900) SpO2:  [96 %-100 %] 97 % (01/24 0900) Weight:  [77.1 kg-80.7 kg] 80.7 kg (01/24 0500) Last BM Date: 10/13/20 General:   Alert,  Well-developed, well-nourished, pleasant and cooperative in NAD Head:  Normocephalic and atraumatic. Eyes:  Sclera clear, no icterus.   Conjunctiva pink. Ears:  Normal auditory acuity. Nose:  No deformity, discharge,  or lesions. Mouth:  No deformity or lesions, dentition normal. Lungs:  Clear throughout to auscultation.  Heart:  S1 S2 present without murmurs Abdomen:  Soft, nontender and nondistended. No masses, hepatosplenomegaly or hernias noted. Normal bowel sounds, without guarding, and without rebound.   Rectal:  Deferred until time of colonoscopy.   Msk:  Symmetrical without gross deformities. Normal posture. Extremities:  Without  edema. Neurologic:  Alert and  oriented x4 Psych:  Alert and cooperative. Normal mood and affect.  Intake/Output from previous day: 01/23 0701 - 01/24 0700 In: 1909.2 [I.V.:859.2; IV Piggyback:1050] Out: -  Intake/Output this shift: Total I/O In: -  Out: 350 [Urine:350]  Lab Results: Recent Labs    10/13/20 1238 10/13/20 1713 10/13/20 2226 10/14/20 0745  WBC 10.3  --   --  6.0  HGB 11.8* 11.2* 10.7* 9.6*  HCT 35.3* 33.4* 31.9* 29.1*  PLT 226  --   --  209   BMET Recent Labs    10/13/20 1238 10/14/20 0745  NA 135 138  K 3.5 4.4  CL 106 109  CO2 23 26  GLUCOSE 160* 115*  BUN 22* 18  CREATININE 0.79 0.86  CALCIUM 8.3* 8.2*   LFT Recent Labs     10/13/20 1238 10/14/20 0745  PROT 6.7 6.0*  ALBUMIN 3.8 3.3*  AST 25 16  ALT 25 19  ALKPHOS 65 56  BILITOT 0.4 0.5   PT/INR Recent Labs    10/13/20 2226  LABPROT 13.4  INR 1.1    Studies/Results: No results found.  Impression: 30 year old male presenting this admission with hematemesis, melena, and acute blood loss anemia due to UGI bleed. Known chronic alcohol use but no known liver disease. Hgb on admission 11.8 and now 9.6 this morning, but he has not had any further overt GI bleeding since presenting to the ICU. Epigastric pain, N/V have resolved. Denies NSAIDs.   Agree with Protonix infusion. Needs diagnostic EGD today, which I have discussed with patient and wife, who was on the phone during entire consultation. Patient is Spanish-speaking but does understand English well, and his wife was able to provide history during consultation as well.   He has been NPO and stable for EGD today. Covid negative this admission.   Plan: Agree with PPI infusion for now EGD with Dr. [name] today; risks and benefits discussed with patient with stated understanding Remain NPO Follow CBC, transfuse if needed Recommend ETOH cessation Further recommendations to follow   Annitta Needs, PhD, ANP-BC [facility] Gastroenterology     LOS: 0 days    10/14/2020, 9:47 AM

## 2020-10-14 NOTE — Consult Note (Signed)
Referring Provider: Margarita Mail, PA-C, [facility] Primary Care Physician:  Patient, No Pcp Per Primary Gastroenterologist:  Previously unassigned. Dr. [name]  Date of Admission: 10/13/20 Date of Consultation: 10/14/20  Reason for Consultation: Upper GI bleed   HPI:  Steven Martinez is a 30 y.o. year old male presenting to the ED yesterday after acute onset of hematemesis, reported melena starting on Friday, burning epigastric discomfort, found to be heme positive in the ED with Hgb 11.8. Unknown baseline Hgb. This morning, Hgb 9.6. Last episode of melena yesterday. No further hematemesis since arrival to ICU.   Reports acute onset on Saturday of N/V with bright red emesis, large amounts X 3. Burning upper abdomen for one month, worsened by coffee, soda, and greasy foods. No pain currently. Denies any NSAIDs. Drinks a "few" beers each night and heavy drinking over weekend per wife. No smoking or drug use. Chronic GERD, worsened nocturnally and waking patient up at night. No unexplained weight loss or lack of appetite. No prior EGD. Denies any known personal history of liver disease. INR normal at 1.1.  Per cousin at bedside and patient, he has not sought primary care in some time.   COVID negative this admission.   Past Medical History:  Diagnosis Date  . Acute upper GI bleed 10/13/2020  . Alcohol abuse 10/13/2020  . GERD (gastroesophageal reflux disease)   . HTN (hypertension)     Past Surgical History:  Procedure Laterality Date  . None      Prior to Admission medications   Medication Sig Start Date End Date Taking? Authorizing Provider  omeprazole-sodium bicarbonate (ZEGERID) 40-1100 MG capsule Take 1 capsule by mouth daily before breakfast.   Yes [provider]  acetaminophen (TYLENOL 8 HOUR) 650 MG CR tablet Take 1 tablet (650 mg total) by mouth every 8 (eight) hours as needed for pain or fever. Patient not taking: No sig reported 01/23/20   Varney Biles, MD   aspirin 81 MG chewable tablet Chew 1 tablet (81 mg total) by mouth daily. Patient not taking: No sig reported 01/23/20   Varney Biles, MD  dextromethorphan-guaiFENesin [facility] DM) 30-600 MG 12hr tablet Take 1 tablet by mouth 2 (two) times daily. Patient not taking: No sig reported 01/23/20   Varney Biles, MD  ondansetron (ZOFRAN ODT) 8 MG disintegrating tablet Take 1 tablet (8 mg total) by mouth every 8 (eight) hours as needed for nausea. Patient not taking: No sig reported 01/23/20   Varney Biles, MD    Current Facility-Administered Medications  Medication Dose Route Frequency Provider Last Rate Last Admin  . 0.9 % NaCl with KCl 20 mEq/ L  infusion   Intravenous Continuous Reubin Milan, MD 125 mL/hr at 10/14/20 0610 New Bag at 10/14/20 0610  . acetaminophen (TYLENOL) tablet 650 mg  650 mg Oral Q6H PRN Reubin Milan, MD       Or  . acetaminophen (TYLENOL) suppository 650 mg  650 mg Rectal Q6H PRN Reubin Milan, MD      . Chlorhexidine Gluconate Cloth 2 % PADS 6 each  6 each Topical Daily Murlean Iba, MD   6 each at 10/14/20 [phone]  . folic acid (FOLVITE) tablet 1 mg  1 mg Oral Daily Reubin Milan, MD      . LORazepam (ATIVAN) injection 0-4 mg  0-4 mg Intravenous Q4H Reubin Milan, MD       Followed by  . [START ON 10/16/2020] LORazepam (ATIVAN) injection 0-4 mg  0-4 mg Intravenous Q8H Reubin Milan, MD      . LORazepam (ATIVAN) tablet 1-4 mg  1-4 mg Oral Q1H PRN Reubin Milan, MD       Or  . LORazepam (ATIVAN) injection 1-4 mg  1-4 mg Intravenous Q1H PRN Reubin Milan, MD      . magnesium sulfate IVPB 4 g 100 mL  4 g Intravenous Once Wynetta Emery, Clanford L, MD 50 mL/hr at 10/14/20 0903 4 g at 10/14/20 0903  . multivitamin with minerals tablet 1 tablet  1 tablet Oral Daily Reubin Milan, MD      . pantoprazole (PROTONIX) 80 mg in sodium chloride 0.9 % 100 mL (0.8 mg/mL) infusion  8 mg/hr Intravenous Continuous Reubin Milan,  MD 10 mL/hr at 10/14/20 0612 8 mg/hr at 10/14/20 0612  . potassium PHOSPHATE 20 mmol in dextrose 5 % 500 mL infusion  20 mmol Intravenous Once Reubin Milan, MD 84 mL/hr at 10/14/20 0922 20 mmol at 10/14/20 0922  . prochlorperazine (COMPAZINE) injection 5 mg  5 mg Intravenous Q4H PRN Reubin Milan, MD      . thiamine tablet 100 mg  100 mg Oral Daily Reubin Milan, MD       Or  . thiamine (B-1) injection 100 mg  100 mg Intravenous Daily Reubin Milan, MD   100 mg at 10/14/20 5329    Allergies as of 10/13/2020  . (No Known Allergies)    Family History  Problem Relation Age of Onset  . Diabetes Mellitus II Mother   . Hypertension Mother   . Colon cancer Neg Hx   . Colon polyps Neg Hx     Social History   Socioeconomic History  . Marital status: Married    Spouse name: Not on file  . Number of children: Not on file  . Years of education: Not on file  . Highest education level: Not on file  Occupational History  . Not on file  Tobacco Use  . Smoking status: Never Smoker  . Smokeless tobacco: Never Used  Substance and Sexual Activity  . Alcohol use: Yes    Alcohol/week: 57.0 - 58.0 standard drinks    Types: 54 Cans of beer, 3 - 4 Shots of liquor per week    Comment: Drinks a sixpack daily and 12-15 beers on Saturdays and Sundays  . Drug use: Never  . Sexual activity: Not on file  Other Topics Concern  . Not on file  Social History Narrative  . Not on file   Social Determinants of Health   Financial Resource Strain: Not on file  Food Insecurity: Not on file  Transportation Needs: Not on file  Physical Activity: Not on file  Stress: Not on file  Social Connections: Not on file  Intimate Partner Violence: Not on file    Review of Systems: Gen: Denies fever, chills, loss of appetite, change in weight or weight loss CV: Denies chest pain, heart palpitations, syncope, edema  Resp: Denies shortness of breath with rest, cough, wheezing GI: see  HPI GU : Denies urinary burning, urinary frequency, urinary incontinence.  MS: Denies joint pain,swelling, cramping Derm: Denies rash, itching, dry skin Psych: Denies depression, anxiety,confusion, or memory loss Heme: see HPI  Physical Exam: Vital signs in last 24 hours: Temp:  [98.4 F (36.9 C)-99 F (37.2 C)] 98.6 F (37 C) (01/24 0812) Pulse Rate:  [79-111] 80 (01/24 0900) Resp:  [9-32] 19 (01/24 0900) BP: (117-149)/(66-101) 119/81 (  01/24 0900) SpO2:  [96 %-100 %] 97 % (01/24 0900) Weight:  [77.1 kg-80.7 kg] 80.7 kg (01/24 0500) Last BM Date: 10/13/20 General:   Alert,  Well-developed, well-nourished, pleasant and cooperative in NAD Head:  Normocephalic and atraumatic. Eyes:  Sclera clear, no icterus.   Conjunctiva pink. Ears:  Normal auditory acuity. Nose:  No deformity, discharge,  or lesions. Mouth:  No deformity or lesions, dentition normal. Lungs:  Clear throughout to auscultation.  Heart:  S1 S2 present without murmurs Abdomen:  Soft, nontender and nondistended. No masses, hepatosplenomegaly or hernias noted. Normal bowel sounds, without guarding, and without rebound.   Rectal:  Deferred until time of colonoscopy.   Msk:  Symmetrical without gross deformities. Normal posture. Extremities:  Without  edema. Neurologic:  Alert and  oriented x4 Psych:  Alert and cooperative. Normal mood and affect.  Intake/Output from previous day: 01/23 0701 - 01/24 0700 In: 1909.2 [I.V.:859.2; IV Piggyback:1050] Out: -  Intake/Output this shift: Total I/O In: -  Out: 350 [Urine:350]  Lab Results: Recent Labs    10/13/20 1238 10/13/20 1713 10/13/20 2226 10/14/20 0745  WBC 10.3  --   --  6.0  HGB 11.8* 11.2* 10.7* 9.6*  HCT 35.3* 33.4* 31.9* 29.1*  PLT 226  --   --  209   BMET Recent Labs    10/13/20 1238 10/14/20 0745  NA 135 138  K 3.5 4.4  CL 106 109  CO2 23 26  GLUCOSE 160* 115*  BUN 22* 18  CREATININE 0.79 0.86  CALCIUM 8.3* 8.2*   LFT Recent Labs     10/13/20 1238 10/14/20 0745  PROT 6.7 6.0*  ALBUMIN 3.8 3.3*  AST 25 16  ALT 25 19  ALKPHOS 65 56  BILITOT 0.4 0.5   PT/INR Recent Labs    10/13/20 2226  LABPROT 13.4  INR 1.1    Studies/Results: No results found.  Impression: 30 year old male presenting this admission with hematemesis, melena, and acute blood loss anemia due to UGI bleed. Known chronic alcohol use but no known liver disease. Hgb on admission 11.8 and now 9.6 this morning, but he has not had any further overt GI bleeding since presenting to the ICU. Epigastric pain, N/V have resolved. Denies NSAIDs.   Agree with Protonix infusion. Needs diagnostic EGD today, which I have discussed with patient and wife, who was on the phone during entire consultation. Patient is Spanish-speaking but does understand English well, and his wife was able to provide history during consultation as well.   He has been NPO and stable for EGD today. Covid negative this admission.   Plan: Agree with PPI infusion for now EGD with Dr. [name] today; risks and benefits discussed with patient with stated understanding Remain NPO Follow CBC, transfuse if needed Recommend ETOH cessation Further recommendations to follow   Annitta Needs, PhD, ANP-BC [facility]     LOS: 0 days    10/14/2020, 9:47 AM

## 2020-10-14 NOTE — TOC Initial Note (Signed)
Transition of Care [facility]) - Initial/Assessment Note    Patient Details  Name: Steven Martinez MRN: [id number] Date of Birth: [birth year]  [facility]) CM/SW Contact:    Boneta Lucks, RN Phone Number: 10/14/2020, 3:54 PM  Clinical Narrative:  Patient admitted with acute upper GI bleed. TOC consulted for Substance abuse.  Patient speaks spanish, understanding some. Brother at the bedside, speaking [place]. Patient agreed to take handout. Discussed the importance of decreasing and or stopping alcohol. Patient states he can stop. Brother state if he needs help they will find help, patient has children. Patient agrees he want to be here for his family.   Wife now at the bedside, [place] speaking. Patient does not have insurance or PCP listed. Per wife patient now goes to [facility] in [place]. Added to patients chart.                  Expected Discharge Plan: Home/Self Care Barriers to Discharge: Continued Medical Work up   Patient Goals and CMS Choice Patient states their goals for this hospitalization and ongoing recovery are:: to go home. CMS Medicare.gov Compare Post Acute Care list provided to:: Patient Choice offered to / list presented to : Patient  Expected Discharge Plan and Services Expected Discharge Plan: Home/Self Care    Living arrangements for the past 2 months: Single Family Home       Prior Living Arrangements/Services Living arrangements for the past 2 months: Single Family Home Lives with:: Spouse          Need for Family Participation in Patient Care: Yes (Comment) Care giver support system in place?: Yes (comment)   Criminal Activity/Legal Involvement Pertinent to Current Situation/Hospitalization: No - Comment as needed  Activities of Daily Living Home Assistive Devices/Equipment: None ADL Screening (condition at time of admission) Patient's cognitive ability adequate to safely complete daily activities?: Yes Is the patient deaf or  have difficulty hearing?: No Does the patient have difficulty seeing, even when wearing glasses/contacts?: No Does the patient have difficulty concentrating, remembering, or making decisions?: No Patient able to express need for assistance with ADLs?: Yes Does the patient have difficulty dressing or bathing?: No Independently performs ADLs?: Yes (appropriate for developmental age) Does the patient have difficulty walking or climbing stairs?: No Weakness of Legs: None Weakness of Arms/Hands: None  Permission Sought/Granted     Affect (typically observed): Accepting,Pleasant Orientation: : Oriented to Self,Oriented to Place,Oriented to  Time,Oriented to Situation Alcohol / Substance Use: Alcohol Use Psych Involvement: No (comment)  Admission diagnosis:  Melena [K92.1] Acute upper GI bleed [K92.2] Gastrointestinal hemorrhage with hematemesis [K92.0] Patient Active Problem List   Diagnosis Date Noted  . Vitamin D deficiency 10/14/2020  . Acute upper GI bleed 10/13/2020  . Acute blood loss anemia 10/13/2020  . Hyperglycemia 10/13/2020  . Hypocalcemia 10/13/2020  . Hypophosphatemia 10/13/2020  . Alcohol abuse 10/13/2020   PCP:  Patient, No Pcp Per Pharmacy:  No Pharmacies Listed

## 2020-10-14 NOTE — Transfer of Care (Signed)
Immediate Anesthesia Transfer of Care Note  Patient: Steven Martinez  Procedure(s) Performed: ESOPHAGOGASTRODUODENOSCOPY (EGD) WITH PROPOFOL (N/A )  Patient Location: PACU  Anesthesia Type:General  Level of Consciousness: drowsy  Airway & Oxygen Therapy: Patient Spontanous Breathing  Post-op Assessment: Report given to RN and Post -op Vital signs reviewed and stable  Post vital signs: Reviewed and stable  Last Vitals:  Vitals Value Taken Time  BP 108/75 10/14/20 1300  Temp 37 C 10/14/20 1300  Pulse 90 10/14/20 1300  Resp 24 10/14/20 1300  SpO2 97 % 10/14/20 1300    Last Pain:  Vitals:   10/14/20 1300  TempSrc: Oral  PainSc: 0-No pain      Patients Stated Pain Goal: 7 (88/50/27 7412)  Complications: No complications documented.

## 2020-10-14 NOTE — Progress Notes (Signed)
Pt arrived back on the floor from procedure. No complaints. Will continue to monitor.

## 2020-10-14 NOTE — Progress Notes (Signed)
Endo staff present at bedside to transport patient to procedure. Brother in law updated at bedside and spouse updated via telephone.

## 2020-10-15 ENCOUNTER — Encounter: Payer: Self-pay | Admitting: Internal Medicine

## 2020-10-15 ENCOUNTER — Other Ambulatory Visit: Payer: Self-pay

## 2020-10-15 ENCOUNTER — Telehealth: Payer: Self-pay | Admitting: Gastroenterology

## 2020-10-15 DIAGNOSIS — D649 Anemia, unspecified: Secondary | ICD-10-CM

## 2020-10-15 DIAGNOSIS — K254 Chronic or unspecified gastric ulcer with hemorrhage: Principal | ICD-10-CM

## 2020-10-15 DIAGNOSIS — K250 Acute gastric ulcer with hemorrhage: Secondary | ICD-10-CM

## 2020-10-15 LAB — CBC WITH DIFFERENTIAL/PLATELET
Abs Immature Granulocytes: 0.04 10*3/uL (ref 0.00–0.07)
Basophils Absolute: 0.1 10*3/uL (ref 0.0–0.1)
Basophils Relative: 1 %
Eosinophils Absolute: 0.1 10*3/uL (ref 0.0–0.5)
Eosinophils Relative: 1 %
HCT: 29.2 % — ABNORMAL LOW (ref 39.0–52.0)
Hemoglobin: 9.4 g/dL — ABNORMAL LOW (ref 13.0–17.0)
Immature Granulocytes: 1 %
Lymphocytes Relative: 30 %
Lymphs Abs: 1.5 10*3/uL (ref 0.7–4.0)
MCH: 28.9 pg (ref 26.0–34.0)
MCHC: 32.2 g/dL (ref 30.0–36.0)
MCV: 89.8 fL (ref 80.0–100.0)
Monocytes Absolute: 0.4 10*3/uL (ref 0.1–1.0)
Monocytes Relative: 7 %
Neutro Abs: 3.1 10*3/uL (ref 1.7–7.7)
Neutrophils Relative %: 60 %
Platelets: 209 10*3/uL (ref 150–400)
RBC: 3.25 MIL/uL — ABNORMAL LOW (ref 4.22–5.81)
RDW: 13 % (ref 11.5–15.5)
WBC: 5.2 10*3/uL (ref 4.0–10.5)
nRBC: 0 % (ref 0.0–0.2)

## 2020-10-15 LAB — PTH, INTACT AND CALCIUM
Calcium, Total (PTH): 8.2 mg/dL — ABNORMAL LOW (ref 8.7–10.2)
PTH: 51 pg/mL (ref 15–65)

## 2020-10-15 MED ORDER — OMEPRAZOLE-SODIUM BICARBONATE 40-1100 MG PO CAPS: 1.0000 | ORAL_CAPSULE | Freq: Two times a day (BID) | ORAL | 1 refills | Status: DC

## 2020-10-15 NOTE — Discharge Summary (Signed)
Physician Discharge Summary  Steven Martinez XNA:[id number] DOB: 26-Nov-1990 DOA: 10/13/2020  PCP: Gwenlyn Saran [place] date: 10/13/2020 Discharge date: 10/15/2020  Admitted From: home Disposition:  home  Recommendations for Outpatient Follow-up:  1. Follow up with PCP in 1-2 weeks 2. Please obtain BMP/CBC in one week 3. Follow up with GI in the next 2-4 weeks  Home Health: Equipment/Devices:  Discharge Condition:stable CODE STATUS:full code Diet recommendation: regular diet  Brief/Interim Summary: 30 y/o male with history of alcohol abuse, admitted to the hospital with hematemesis. He was seen by GI and underwent endoscopy with results as noted below. He was noted to have a gastric ulcer with visible vessel that was cauterized. He has not had any further episodes of bleeding since admission. He is on BID PPI. He has been advised to refrain from alcohol and NSAIDs. He will follow up with GI in the next few weeks  He did have a decline in hemoglobin consistent with acute blood loss anemia. He did not require transfusion of prbc. Continue to follow up hemoglobin as an outpatient  He was counseled on the importance of abstinence of alcohol. He seems motivated to quit drinking. He was seen by social work and offered resources to aid in his recovery from alcohol. He was monitored in the hospital and did not have any signs of alcohol withdrawal.  Discharge Diagnoses:  Principal Problem:   Acute upper GI bleed Active Problems:   Acute blood loss anemia   Hyperglycemia   Hypocalcemia   Hypophosphatemia   Alcohol abuse   Vitamin D deficiency   Gastric ulcer with hemorrhage    Discharge Instructions  Discharge Instructions    Diet - low sodium heart healthy   Complete by: As directed    Increase activity slowly   Complete by: As directed      Allergies as of 10/15/2020   No Known Allergies     Medication List    STOP taking these medications    aspirin 81 MG chewable tablet   dextromethorphan-guaiFENesin 30-600 MG 12hr tablet Commonly known as: MUCINEX DM   ondansetron 8 MG disintegrating tablet Commonly known as: Zofran ODT     TAKE these medications   acetaminophen 650 MG CR tablet Commonly known as: Tylenol 8 Hour Take 1 tablet (650 mg total) by mouth every 8 (eight) hours as needed for pain or fever.   omeprazole-sodium bicarbonate 40-1100 MG capsule Commonly known as: ZEGERID Take 1 capsule by mouth in the morning and at bedtime. What changed: when to take this       No Known Allergies  Consultations:  GI   Procedures/Studies: EGD: - Non-obstructing non-bleeding gastric ulcer with a                            visible vessel. There is no evidence of                            perforation. Treated with bipolar cautery.                           - Normal duodenal bulb, first portion of the                            duodenum and second portion of the duodenum.                           -  No specimens collected.   Subjective: No nausea, vomiting or abdominal pain  Discharge Exam: Vitals:   10/14/20 2300 10/15/20 0500 10/15/20 0758 10/15/20 0800  BP:   125/73 125/73  Pulse:   61 (!) 58  Resp:   17   Temp: 99.2 F (37.3 C) 98.7 F (37.1 C) 98.7 F (37.1 C)   TempSrc: Oral Oral Oral   SpO2:   98% 98%  Weight:      Height:        General: Pt is alert, awake, not in acute distress Cardiovascular: RRR, S1/S2 +, no rubs, no gallops Respiratory: CTA bilaterally, no wheezing, no rhonchi Abdominal: Soft, NT, ND, bowel sounds + Extremities: no edema, no cyanosis    The results of significant diagnostics from this hospitalization (including imaging, microbiology, ancillary and laboratory) are listed below for reference.     Microbiology: Recent Results (from the past 240 hour(s))  SARS Coronavirus 2 by RT PCR (hospital order, performed in [facility] hospital lab) Nasopharyngeal Nasopharyngeal  Swab     Status: None   Collection Time: 10/13/20  7:54 PM   Specimen: Nasopharyngeal Swab  Result Value Ref Range Status   SARS Coronavirus 2 NEGATIVE NEGATIVE Final    Comment: (NOTE) SARS-CoV-2 target nucleic acids are NOT DETECTED.  The SARS-CoV-2 RNA is generally detectable in upper and lower respiratory specimens during the acute phase of infection. The lowest concentration of SARS-CoV-2 viral copies this assay can detect is 250 copies / mL. A negative result does not preclude SARS-CoV-2 infection and should not be used as the sole basis for treatment or other patient management decisions.  A negative result may occur with improper specimen collection / handling, submission of specimen other than nasopharyngeal swab, presence of viral mutation(s) within the areas targeted by this assay, and inadequate number of viral copies (<250 copies / mL). A negative result must be combined with clinical observations, patient history, and epidemiological information.  Fact Sheet for Patients:   StrictlyIdeas.no  Fact Sheet for Healthcare Providers: BankingDealers.co.za  This test is not yet approved or  cleared by the [place] FDA and has been authorized for detection and/or diagnosis of SARS-CoV-2 by FDA under an Emergency Use Authorization (EUA).  This EUA will remain in effect (meaning this test can be used) for the duration of the COVID-19 declaration under Section 564(b)(1) of the Act, 21 U.S.C. section 360bbb-3(b)(1), unless the authorization is terminated or revoked sooner.  Performed at [facility], [address], [place], [place] [address]   MRSA PCR Screening     Status: None   Collection Time: 10/13/20 11:02 PM   Specimen: Nasopharyngeal  Result Value Ref Range Status   MRSA by PCR NEGATIVE NEGATIVE Final    Comment:        The GeneXpert MRSA Assay (FDA approved for NASAL specimens only), is one component of  a comprehensive MRSA colonization surveillance program. It is not intended to diagnose MRSA infection nor to guide or monitor treatment for MRSA infections. Performed at [facility], [address], [place], [place] [address]      Labs: BNP (last 3 results) No results for input(s): BNP in the last 8760 hours. Basic Metabolic Panel: Recent Labs  Lab 10/13/20 1238 10/14/20 0259 10/14/20 0745  NA 135  --  138  K 3.5  --  4.4  CL 106  --  109  CO2 23  --  26  GLUCOSE 160*  --  115*  BUN 22*  --  18  CREATININE 0.79  --  0.86  CALCIUM 8.3* 8.2* 8.2*  MG 1.9  --   --   PHOS 2.1*  --   --    Liver Function Tests: Recent Labs  Lab 10/13/20 1238 10/14/20 0745  AST 25 16  ALT 25 19  ALKPHOS 65 56  BILITOT 0.4 0.5  PROT 6.7 6.0*  ALBUMIN 3.8 3.3*   Recent Labs  Lab 10/13/20 1238  LIPASE 24   No results for input(s): AMMONIA in the last 168 hours. CBC: Recent Labs  Lab 10/13/20 1238 10/13/20 1713 10/13/20 2226 10/14/20 0745  WBC 10.3  --   --  6.0  HGB 11.8* 11.2* 10.7* 9.6*  HCT 35.3* 33.4* 31.9* 29.1*  MCV 84.7  --   --  86.1  PLT 226  --   --  209   Cardiac Enzymes: No results for input(s): CKTOTAL, CKMB, CKMBINDEX, TROPONINI in the last 168 hours. BNP: Invalid input(s): POCBNP CBG: No results for input(s): GLUCAP in the last 168 hours. D-Dimer No results for input(s): DDIMER in the last 72 hours. Hgb A1c No results for input(s): HGBA1C in the last 72 hours. Lipid Profile No results for input(s): CHOL, HDL, LDLCALC, TRIG, CHOLHDL, LDLDIRECT in the last 72 hours. Thyroid function studies No results for input(s): TSH, T4TOTAL, T3FREE, THYROIDAB in the last 72 hours.  Invalid input(s): FREET3 Anemia work up No results for input(s): VITAMINB12, FOLATE, FERRITIN, TIBC, IRON, RETICCTPCT in the last 72 hours. Urinalysis    Component Value Date/Time   COLORURINE STRAW (A) 10/13/2020 1631   APPEARANCEUR CLEAR 10/13/2020 1631   LABSPEC 1.015  10/13/2020 1631   PHURINE 7.0 10/13/2020 1631   GLUCOSEU NEGATIVE 10/13/2020 1631   HGBUR NEGATIVE 10/13/2020 1631   BILIRUBINUR NEGATIVE 10/13/2020 1631   KETONESUR NEGATIVE 10/13/2020 1631   PROTEINUR NEGATIVE 10/13/2020 1631   NITRITE NEGATIVE 10/13/2020 1631   LEUKOCYTESUR NEGATIVE 10/13/2020 1631   Sepsis Labs Invalid input(s): PROCALCITONIN,  WBC,  LACTICIDVEN Microbiology Recent Results (from the past 240 hour(s))  SARS Coronavirus 2 by RT PCR (hospital order, performed in [place] hospital lab) Nasopharyngeal Nasopharyngeal Swab     Status: None   Collection Time: 10/13/20  7:54 PM   Specimen: Nasopharyngeal Swab  Result Value Ref Range Status   SARS Coronavirus 2 NEGATIVE NEGATIVE Final    Comment: (NOTE) SARS-CoV-2 target nucleic acids are NOT DETECTED.  The SARS-CoV-2 RNA is generally detectable in upper and lower respiratory specimens during the acute phase of infection. The lowest concentration of SARS-CoV-2 viral copies this assay can detect is 250 copies / mL. A negative result does not preclude SARS-CoV-2 infection and should not be used as the sole basis for treatment or other patient management decisions.  A negative result may occur with improper specimen collection / handling, submission of specimen other than nasopharyngeal swab, presence of viral mutation(s) within the areas targeted by this assay, and inadequate number of viral copies (<250 copies / mL). A negative result must be combined with clinical observations, patient history, and epidemiological information.  Fact Sheet for Patients:   StrictlyIdeas.no  Fact Sheet for Healthcare Providers: BankingDealers.co.za  This test is not yet approved or  cleared by the [place] FDA and has been authorized for detection and/or diagnosis of SARS-CoV-2 by FDA under an Emergency Use Authorization (EUA).  This EUA will remain in effect (meaning this test  can be used) for the duration of the COVID-19 declaration under Section 564(b)(1) of the  Act, 21 U.S.C. section 360bbb-3(b)(1), unless the authorization is terminated or revoked sooner.  Performed at [facility], [address] [place], [place] [address]   MRSA PCR Screening     Status: None   Collection Time: 10/13/20 11:02 PM   Specimen: Nasopharyngeal  Result Value Ref Range Status   MRSA by PCR NEGATIVE NEGATIVE Final    Comment:        The GeneXpert MRSA Assay (FDA approved for NASAL specimens only), is one component of a comprehensive MRSA colonization surveillance program. It is not intended to diagnose MRSA infection nor to guide or monitor treatment for MRSA infections. Performed at [facility], [address] [place], [place] [address]      Time coordinating discharge: 20mins  SIGNED:   Kathie Dike, MD  [facility] 10/15/2020, 11:33 AM   If 7PM-7AM, please contact night-coverage [email or web address]

## 2020-10-15 NOTE — Progress Notes (Addendum)
Subjective: Doing well this morning. No abdominal pain, nausea, or vomiting. No brbpr or melena. In fact, he has not had a BM since presenting to the emergency room but is passing gas. Tolerated his soft diet well yesterday.   Objective: Vital signs in last 24 hours: Temp:  [98.2 F (36.8 C)-99.2 F (37.3 C)] 98.7 F (37.1 C) (01/25 0758) Pulse Rate:  [58-98] 58 (01/25 0800) Resp:  [17-24] 17 (01/25 0758) BP: (108-145)/(71-86) 125/73 (01/25 0800) SpO2:  [97 %-99 %] 98 % (01/25 0800) FiO2 (%):  [21 %] 21 % (01/24 1232) Last BM Date: 10/13/20 General:  Alert and oriented, pleasant.  Head:  Normocephalic and atraumatic. Eyes:  No icterus, sclera clear. Conjuctiva pink.  Abdomen:  Bowel sounds present, soft, non-tender, non-distended. No HSM or hernias noted. No rebound or guarding. No masses appreciate.   Msk:  Symmetrical without gross deformities. Normal posture. Extremities:  Without edema. Neurologic:  Alert and  oriented x4;  grossly normal neurologically. Skin:  Warm and dry, intact without significant lesions.  Psych: Normal mood and affect.  Intake/Output from previous day: 01/24 0701 - 01/25 0700 In: 1458.8 [I.V.:1311; IV Piggyback:147.8] Out: 350 [Urine:350] Intake/Output this shift: No intake/output data recorded.  Lab Results: Recent Labs    10/13/20 1238 10/13/20 1713 10/13/20 2226 10/14/20 0745  WBC 10.3  --   --  6.0  HGB 11.8* 11.2* 10.7* 9.6*  HCT 35.3* 33.4* 31.9* 29.1*  PLT 226  --   --  209   BMET Recent Labs    10/13/20 1238 10/14/20 0259 10/14/20 0745  NA 135  --  138  K 3.5  --  4.4  CL 106  --  109  CO2 23  --  26  GLUCOSE 160*  --  115*  BUN 22*  --  18  CREATININE 0.79  --  0.86  CALCIUM 8.3* 8.2* 8.2*   LFT Recent Labs    10/13/20 1238 10/14/20 0745  PROT 6.7 6.0*  ALBUMIN 3.8 3.3*  AST 25 16  ALT 25 19  ALKPHOS 65 56  BILITOT 0.4 0.5   PT/INR Recent Labs    10/13/20 2226  LABPROT 13.4  INR 1.1    Assessment: 30 year old male presenting this admission with hematemesis, melena, and acute blood loss anemia due to UGI bleed. Known chronic alcohol use but no known liver disease. Hgb on admission 11.8 and now 9.6 morning of 1/24 without further overt GI bleeding. Denied NSAIDs. Underwent EGD 1/24 revealing one non-bleeding, non-obstructing gastric ulcer with visible vessel s/p bipolar cautery, normal examined esophagus and duodenum. No biopsies performed. Recommended IV PPI BID inpatient, discharge on omeprazole 40 mg BID, and repeat EGD in 8 weeks.   Clinically, patient is doing well. No overt GI bleeding in at least 24 hours. No abdominal pain, nausea, or vomiting. Tolerating soft diet well. He has not had repeat CBC today, will place orders. Discussed importance of alcohol cessation and the need to continue PPI BID with close follow-up in our office to arrange follow-up EGD.   Plan: CBC  H. Pylori IgG.  Continue Protonix IV BID while inpatient. Transition to omeprazole 40 mg BID at discharge.  Monitor for overt GI bleeding.  Counseled on alcohol cessation.  Continue to avoid NSAIDs.  Patient will likely discharge later this afternoon if hemoglobin is stable, continues to be without overt GI bleeding, and continues to tolerate his diet well.  Close follow-up in our office in 2-4 weeks.  Repeat  EGD in 8 weeks with Dr. [name].     LOS: 1 day    10/15/2020, 10:05 AM   Aliene Altes, [facility]   GI attending note Patient was independently evaluated by me earlier today.  I called his wife [place] and brought her up-to-date on his condition. She told me that he had quit drinking alcohol 2 weeks ago and she will make sure he does not drink alcohol anymore and he also would refrain from using any NSAIDs. Plan as above. We will also obtain H. pylori serology and if positive he will be treated prior to follow-up EGD

## 2020-10-15 NOTE — Telephone Encounter (Signed)
Stacey: Patient was discharged from hospital today. Please arrange OV in 2-4 weeks. Dx: Gastric ulcer, upper GI bleed.   Alicia: Please arrange CBC in 1 week. Dx: anemia.

## 2020-10-15 NOTE — Telephone Encounter (Signed)
Spoke with pts spouse. Lab orders were placed and mailed. Pt will complete labs in 1 week.

## 2020-10-16 LAB — H. PYLORI ANTIBODY, IGG: H Pylori IgG: 4.03 Index Value — ABNORMAL HIGH (ref 0.00–0.79)

## 2020-10-17 ENCOUNTER — Encounter (HOSPITAL_COMMUNITY): Payer: Self-pay | Admitting: Internal Medicine

## 2020-10-21 ENCOUNTER — Other Ambulatory Visit (HOSPITAL_COMMUNITY)
Admission: RE | Admit: 2020-10-21 | Discharge: 2020-10-21 | Disposition: A | Discharge status: Home / Self Care | Payer: Self-pay | Source: Ambulatory Visit | Attending: Gastroenterology | Admitting: Gastroenterology

## 2020-10-21 ENCOUNTER — Other Ambulatory Visit: Payer: Self-pay | Admitting: Gastroenterology

## 2020-10-21 ENCOUNTER — Other Ambulatory Visit: Payer: Self-pay

## 2020-10-21 DIAGNOSIS — D649 Anemia, unspecified: Secondary | ICD-10-CM | POA: Insufficient documentation

## 2020-10-21 LAB — CBC WITH DIFFERENTIAL/PLATELET
Abs Immature Granulocytes: 0.01 10*3/uL (ref 0.00–0.07)
Basophils Absolute: 0 10*3/uL (ref 0.0–0.1)
Basophils Relative: 1 %
Eosinophils Absolute: 0.1 10*3/uL (ref 0.0–0.5)
Eosinophils Relative: 2 %
HCT: 31.6 % — ABNORMAL LOW (ref 39.0–52.0)
Hemoglobin: 10.3 g/dL — ABNORMAL LOW (ref 13.0–17.0)
Immature Granulocytes: 0 %
Lymphocytes Relative: 36 %
Lymphs Abs: 1.7 10*3/uL (ref 0.7–4.0)
MCH: 27.7 pg (ref 26.0–34.0)
MCHC: 32.6 g/dL (ref 30.0–36.0)
MCV: 84.9 fL (ref 80.0–100.0)
Monocytes Absolute: 0.4 10*3/uL (ref 0.1–1.0)
Monocytes Relative: 8 %
Neutro Abs: 2.6 10*3/uL (ref 1.7–7.7)
Neutrophils Relative %: 53 %
Platelets: 325 10*3/uL (ref 150–400)
RBC: 3.72 MIL/uL — ABNORMAL LOW (ref 4.22–5.81)
RDW: 12.1 % (ref 11.5–15.5)
WBC: 4.8 10*3/uL (ref 4.0–10.5)
nRBC: 0 % (ref 0.0–0.2)

## 2020-10-21 NOTE — Progress Notes (Signed)
error 

## 2020-10-22 ENCOUNTER — Telehealth: Payer: Self-pay

## 2020-10-22 ENCOUNTER — Other Ambulatory Visit: Payer: Self-pay | Admitting: Gastroenterology

## 2020-10-22 DIAGNOSIS — A048 Other specified bacterial intestinal infections: Secondary | ICD-10-CM

## 2020-10-22 MED ORDER — AMOXICILL-CLARITHRO-LANSOPRAZ PO MISC: Freq: Two times a day (BID) | ORAL | 0 refills | Status: DC

## 2020-10-22 NOTE — Telephone Encounter (Signed)
Steven Martinez, pt called to get H. Pylori medication sent to his pharmacy. Pt has gone to [facility] twice.

## 2020-10-22 NOTE — Telephone Encounter (Signed)
Lmom, medication was sent to pts pharmacy.

## 2020-10-22 NOTE — Telephone Encounter (Signed)
Rx sent 

## 2020-10-23 ENCOUNTER — Telehealth: Payer: Self-pay | Admitting: Internal Medicine

## 2020-10-23 ENCOUNTER — Other Ambulatory Visit: Payer: Self-pay

## 2020-10-23 DIAGNOSIS — D649 Anemia, unspecified: Secondary | ICD-10-CM

## 2020-10-23 DIAGNOSIS — K250 Acute gastric ulcer with hemorrhage: Secondary | ICD-10-CM

## 2020-10-23 NOTE — Telephone Encounter (Signed)
Pt and his wife called this morning to advise [place] the pt had a black BM this morning. Yesterday he felt like something popped in his abdomen. He's not in pain now. They were wondering since he started the equivalent of the Prevpac yesterday evening and wondered if it could be the medicine that turned his stools black. (one episode reported).

## 2020-10-23 NOTE — Telephone Encounter (Signed)
The medications for H. Pylori treatment should not cause black stools. Has he been taking any pepto bismol or iron supplementation?   If he develops abdominal pain, nausea, vomiting, fever, feels light headed, or has another black stool, he should proceed to the emergency room.   Lets also recheck a CBC to ensure his hemoglobin is stable. Please arrange. Dx: anemia, melena.

## 2020-10-23 NOTE — Telephone Encounter (Signed)
Phoned the pt/wife, the wife states she took a look at the pt's stool again and it is more green , not black, no other symptoms but nausea which she contributes to not eating before meds (except a half bowl of oatmeal). The only supplements he has taken is vitamin D. Pt was given instructions incase he needs to go the ER. They will go to [facility]the  hospital lab tomorrow to have labs done. Will fax a copy of the order to [facility] lab.

## 2020-10-23 NOTE — Telephone Encounter (Signed)
Noted  

## 2020-10-23 NOTE — Telephone Encounter (Signed)
[phone]  Please call patient.  He is having black stool and wants to know if it a side effect from his new medication

## 2020-10-24 ENCOUNTER — Other Ambulatory Visit (HOSPITAL_COMMUNITY)
Admission: RE | Admit: 2020-10-24 | Discharge: 2020-10-24 | Disposition: A | Discharge status: Home / Self Care | Payer: Self-pay | Source: Ambulatory Visit | Attending: Gastroenterology | Admitting: Gastroenterology

## 2020-10-24 ENCOUNTER — Other Ambulatory Visit: Payer: Self-pay

## 2020-10-24 DIAGNOSIS — K250 Acute gastric ulcer with hemorrhage: Secondary | ICD-10-CM | POA: Insufficient documentation

## 2020-10-24 DIAGNOSIS — D649 Anemia, unspecified: Secondary | ICD-10-CM | POA: Insufficient documentation

## 2020-10-24 LAB — CBC WITH DIFFERENTIAL/PLATELET
Abs Immature Granulocytes: 0.01 10*3/uL (ref 0.00–0.07)
Basophils Absolute: 0.1 10*3/uL (ref 0.0–0.1)
Basophils Relative: 1 %
Eosinophils Absolute: 0.1 10*3/uL (ref 0.0–0.5)
Eosinophils Relative: 2 %
HCT: 30.2 % — ABNORMAL LOW (ref 39.0–52.0)
Hemoglobin: 10 g/dL — ABNORMAL LOW (ref 13.0–17.0)
Immature Granulocytes: 0 %
Lymphocytes Relative: 36 %
Lymphs Abs: 1.8 10*3/uL (ref 0.7–4.0)
MCH: 27.9 pg (ref 26.0–34.0)
MCHC: 33.1 g/dL (ref 30.0–36.0)
MCV: 84.4 fL (ref 80.0–100.0)
Monocytes Absolute: 0.3 10*3/uL (ref 0.1–1.0)
Monocytes Relative: 6 %
Neutro Abs: 2.8 10*3/uL (ref 1.7–7.7)
Neutrophils Relative %: 55 %
Platelets: 342 10*3/uL (ref 150–400)
RBC: 3.58 MIL/uL — ABNORMAL LOW (ref 4.22–5.81)
RDW: 12.1 % (ref 11.5–15.5)
WBC: 5 10*3/uL (ref 4.0–10.5)
nRBC: 0 % (ref 0.0–0.2)

## 2020-11-21 NOTE — Progress Notes (Signed)
Referring Provider: [facility], [place] Physician:  [facility], [place] Primary GI Physician: Dr. [name]  Chief Complaint  Patient presents with  . gastric ulcer    Changed from omep to pantoprazole bc it was causing nausea/vomiting. Still having ongoing upper abd pain (very little), no nausea, no vomiting    HPI:   Steven Martinez is a 30 y.o. male presenting today for hospital follow-up.   Admitted 1/23-1/25 with hematemesis, melena, and acute blood loss anemia due to UGI bleed. Hgb on admission 11.8 and declined to 9.4 at the time of discharge with no further overt GI bleeding. Denied NSAIDs. History of alcohol abuse and chronic GERD. Underwent EGD 1/24 revealing one non-bleeding, non-obstructing gastric ulcer with visible vessel s/p bipolar cautery, normal examined esophagus and duodenum. No biopsies performed. He was discharged on Zegerid BID.    H. Pylori IgG was positive following hospital discharge. Plan to treat with Prevpac x14 days then resume Zegerid BID.   Hemoglobin 10.3 on 1/31  Patient called 2/2 reporting single black stool. Repeat CBC 2/3 with hemoglobin 10.0.   Today:  Feels improved.  Completed Prevpac.  Resume taking Zegerid; however, this medication was following nausea and vomiting and was switched to Protonix.  He is taking Protonix 40 mg daily. Occasional epigastric abdominal pain. Maybe daily to every other day.  Sort of feels like a hunger pain.  Improves with eating.  About 2/10 in severity. No nausea or vomiting. No melena. No brbpr.  No alcohol since 2 weeks prior to hospitalization. No NSAIDs.   Reports having some weight loss although he has a great appetite.  States he is eating frequently.    Reports having labs with PCP a couple weeks ago with hemoglobin down to 8.8.  States PCP started him on iron and mentioned if his hemoglobin declines any further, would need to consider blood transfusion.    No fatigue. No  lightheadedness or pre-syncope.   No GERD symptoms, dysphagia, constipation, or diarrhea.   Wife is requesting for EGD to be arranged before April 1 as she starts school at this time.  She previously had to drop out of school due to missing days while her husband was sick.  Past Medical History:  Diagnosis Date  . Acute upper GI bleed 10/13/2020   gastric ulcer with visible vessel  . Alcohol abuse 10/13/2020   Quit Jan 2022  . GERD (gastroesophageal reflux disease)     Past Surgical History:  Procedure Laterality Date  . ESOPHAGOGASTRODUODENOSCOPY (EGD) WITH PROPOFOL N/A 10/14/2020    Surgeon: Eloise Harman, DO;  one non-bleeding, non-obstructing gastric ulcer with visible vessel s/p bipolar cautery, normal examined esophagus and duodenum.     Current Outpatient Medications  Medication Sig Dispense Refill  . cholecalciferol (VITAMIN D3) 25 MCG (1000 UNIT) tablet Take 1,000 Units by mouth daily.    . ferrous sulfate 325 (65 FE) MG tablet Take 325 mg by mouth in the morning and at bedtime.    . Multiple Vitamin (MULTIVITAMIN) tablet Take 1 tablet by mouth daily.    . pantoprazole (PROTONIX) 40 MG tablet Take 1 tablet (40 mg total) by mouth 2 (two) times daily before a meal. 60 tablet 3   No current facility-administered medications for this visit.    Allergies as of 11/22/2020  . (No Known Allergies)    Family History  Problem Relation Age of Onset  . Diabetes Mellitus II Mother   . Hypertension Mother   .  Colon cancer Neg Hx   . Colon polyps Neg Hx     Social History   Socioeconomic History  . Marital status: Married    Spouse name: Not on file  . Number of children: Not on file  . Years of education: Not on file  . Highest education level: Not on file  Occupational History  . Not on file  Tobacco Use  . Smoking status: Never Smoker  . Smokeless tobacco: Never Used  Substance and Sexual Activity  . Alcohol use: Not Currently    Alcohol/week: 57.0 - 58.0  standard drinks    Types: 54 Cans of beer, 3 - 4 Shots of liquor per week    Comment: Quit January 2022. Used to drink sixpack daily and 12-15 beers on Saturdays and Sundays.   . Drug use: Never  . Sexual activity: Not on file  Other Topics Concern  . Not on file  Social History Narrative  . Not on file   Social Determinants of Health   Financial Resource Strain: Not on file  Food Insecurity: Not on file  Transportation Needs: Not on file  Physical Activity: Not on file  Stress: Not on file  Social Connections: Not on file    Review of Systems: Gen: See HPI CV: Denies chest pain or palpitations. Resp: Denies dyspnea or cough. GI: See HPI Heme: See HPI  Physical Exam: BP 128/88   Pulse 76   Temp (!) 97.3 F (36.3 C)   Ht 5\' 7"  (1.702 m)   Wt 167 lb (75.8 kg)   BMI 26.16 kg/m  General:   Alert and oriented. No distress noted. Pleasant and cooperative.  Head:  Normocephalic and atraumatic. Eyes:  Conjuctiva clear without scleral icterus. Heart:  S1, S2 present without murmurs appreciated. Lungs:  Clear to auscultation bilaterally. No wheezes, rales, or rhonchi. No distress.  Abdomen:  +BS, soft, non-tender and non-distended. No rebound or guarding. No HSM or masses noted. Msk:  Symmetrical without gross deformities. Normal posture. Extremities:  Without edema. Neurologic:  Alert and  oriented x4 Psych: Normal mood and affect.   Assessment: 30 year old male with history of GERD, alcohol abuse, and recent hospitalization in January 2022 for anemia secondary to recent upper GI bleed in the setting of gastric ulcer with visible vessel noted on EGD completed 10/14/2020 s/p bipolar cautery to visible vessel.  Also found to have elevated H. pylori IgG and completed Prevpac.  Currently on Protonix 40 mg daily and reports overall, he feels improved. Continues to have intermittent upper abdominal discomfort daily to every other day that is about 2/10 in severity and improved with  eating.  No GERD symptoms, nausea, vomiting, melena, or hematochezia. He has been abstinent from alcohol since 2 weeks prior to hospitalization in January.  Denies NSAIDs.  Per our records, hemoglobin was 10.0 on 10/24/2020 which was stable/improved from hospital discharge of hemoglobin 9.4. Notably, patient reports having labs completed with PCP a couple weeks ago with hemoglobin down to 8.8 and he was started on oral iron twice daily at that time.  Not sure why his hemoglobin would have declined this much as he is not having any overt GI bleeding.  We will update CBC to verify this along with iron panel.  Will also need to increase PPI to twice daily due to history of gastric ulcer. He is due for repeat EGD to monitor ulcer healing in 3 weeks.  We can also evaluate for H. pylori eradication at that time.  Plan: 1.  CBC and iron panel. 2.  Increase Protonix to 40 mg twice daily.  Prescription sent to pharmacy. 3.  Continue oral iron twice daily. 4.  Continue to avoid all NSAIDs. 5.  Continue to avoid alcohol. 6.  Proceed with EGD with propofol with Dr. [name] in the near future.  Requested procedure to be arranged in third or fourth week of March. The risks, benefits, and alternatives have been discussed with the patient in detail. The patient states understanding and desires to proceed.  ASA III Hold iron for 7 days prior to procedure. 7.  Follow-up after EGD.   Aliene Altes, PA-C [facility] 11/22/2020

## 2020-11-21 NOTE — H&P (View-Only) (Signed)
Referring Provider: [facility], [place] Physician:  [facility], [place] Primary GI Physician: Dr. [name]  Chief Complaint  Patient presents with  . gastric ulcer    Changed from omep to pantoprazole bc it was causing nausea/vomiting. Still having ongoing upper abd pain (very little), no nausea, no vomiting    HPI:   Steven Martinez is a 30 y.o. male presenting today for hospital follow-up.   Admitted 1/23-1/25 with hematemesis, melena, and acute blood loss anemia due to UGI bleed. Hgb on admission 11.8 and declined to 9.4 at the time of discharge with no further overt GI bleeding. Denied NSAIDs. History of alcohol abuse and chronic GERD. Underwent EGD 1/24 revealing one non-bleeding, non-obstructing gastric ulcer with visible vessel s/p bipolar cautery, normal examined esophagus and duodenum. No biopsies performed. He was discharged on Zegerid BID.    H. Pylori IgG was positive following hospital discharge. Plan to treat with Prevpac x14 days then resume Zegerid BID.   Hemoglobin 10.3 on 1/31  Patient called 2/2 reporting single black stool. Repeat CBC 2/3 with hemoglobin 10.0.   Today:  Feels improved.  Completed Prevpac.  Resume taking Zegerid; however, this medication was following nausea and vomiting and was switched to Protonix.  He is taking Protonix 40 mg daily. Occasional epigastric abdominal pain. Maybe daily to every other day.  Sort of feels like a hunger pain.  Improves with eating.  About 2/10 in severity. No nausea or vomiting. No melena. No brbpr.  No alcohol since 2 weeks prior to hospitalization. No NSAIDs.   Reports having some weight loss although he has a great appetite.  States he is eating frequently.    Reports having labs with PCP a couple weeks ago with hemoglobin down to 8.8.  States PCP started him on iron and mentioned if his hemoglobin declines any further, would need to consider blood transfusion.    No fatigue. No  lightheadedness or pre-syncope.   No GERD symptoms, dysphagia, constipation, or diarrhea.   Wife is requesting for EGD to be arranged before April 1 as she starts school at this time.  She previously had to drop out of school due to missing days while her husband was sick.  Past Medical History:  Diagnosis Date  . Acute upper GI bleed 10/13/2020   gastric ulcer with visible vessel  . Alcohol abuse 10/13/2020   Quit Jan 2022  . GERD (gastroesophageal reflux disease)     Past Surgical History:  Procedure Laterality Date  . ESOPHAGOGASTRODUODENOSCOPY (EGD) WITH PROPOFOL N/A 10/14/2020    Surgeon: Eloise Harman, DO;  one non-bleeding, non-obstructing gastric ulcer with visible vessel s/p bipolar cautery, normal examined esophagus and duodenum.     Current Outpatient Medications  Medication Sig Dispense Refill  . cholecalciferol (VITAMIN D3) 25 MCG (1000 UNIT) tablet Take 1,000 Units by mouth daily.    . ferrous sulfate 325 (65 FE) MG tablet Take 325 mg by mouth in the morning and at bedtime.    . Multiple Vitamin (MULTIVITAMIN) tablet Take 1 tablet by mouth daily.    . pantoprazole (PROTONIX) 40 MG tablet Take 1 tablet (40 mg total) by mouth 2 (two) times daily before a meal. 60 tablet 3   No current facility-administered medications for this visit.    Allergies as of 11/22/2020  . (No Known Allergies)    Family History  Problem Relation Age of Onset  . Diabetes Mellitus II Mother   . Hypertension Mother   .  Colon cancer Neg Hx   . Colon polyps Neg Hx     Social History   Socioeconomic History  . Marital status: Married    Spouse name: Not on file  . Number of children: Not on file  . Years of education: Not on file  . Highest education level: Not on file  Occupational History  . Not on file  Tobacco Use  . Smoking status: Never Smoker  . Smokeless tobacco: Never Used  Substance and Sexual Activity  . Alcohol use: Not Currently    Alcohol/week: 57.0 - 58.0  standard drinks    Types: 54 Cans of beer, 3 - 4 Shots of liquor per week    Comment: Quit January 2022. Used to drink sixpack daily and 12-15 beers on Saturdays and Sundays.   . Drug use: Never  . Sexual activity: Not on file  Other Topics Concern  . Not on file  Social History Narrative  . Not on file   Social Determinants of Health   Financial Resource Strain: Not on file  Food Insecurity: Not on file  Transportation Needs: Not on file  Physical Activity: Not on file  Stress: Not on file  Social Connections: Not on file    Review of Systems: Gen: See HPI CV: Denies chest pain or palpitations. Resp: Denies dyspnea or cough. GI: See HPI Heme: See HPI  Physical Exam: BP 128/88   Pulse 76   Temp (!) 97.3 F (36.3 C)   Ht 5\' 7"  (1.702 m)   Wt 167 lb (75.8 kg)   BMI 26.16 kg/m  General:   Alert and oriented. No distress noted. Pleasant and cooperative.  Head:  Normocephalic and atraumatic. Eyes:  Conjuctiva clear without scleral icterus. Heart:  S1, S2 present without murmurs appreciated. Lungs:  Clear to auscultation bilaterally. No wheezes, rales, or rhonchi. No distress.  Abdomen:  +BS, soft, non-tender and non-distended. No rebound or guarding. No HSM or masses noted. Msk:  Symmetrical without gross deformities. Normal posture. Extremities:  Without edema. Neurologic:  Alert and  oriented x4 Psych: Normal mood and affect.   Assessment: 30 year old male with history of GERD, alcohol abuse, and recent hospitalization in January 2022 for anemia secondary to recent upper GI bleed in the setting of gastric ulcer with visible vessel noted on EGD completed 10/14/2020 s/p bipolar cautery to visible vessel.  Also found to have elevated H. pylori IgG and completed Prevpac.  Currently on Protonix 40 mg daily and reports overall, he feels improved. Continues to have intermittent upper abdominal discomfort daily to every other day that is about 2/10 in severity and improved with  eating.  No GERD symptoms, nausea, vomiting, melena, or hematochezia. He has been abstinent from alcohol since 2 weeks prior to hospitalization in January.  Denies NSAIDs.  Per our records, hemoglobin was 10.0 on 10/24/2020 which was stable/improved from hospital discharge of hemoglobin 9.4. Notably, patient reports having labs completed with PCP a couple weeks ago with hemoglobin down to 8.8 and he was started on oral iron twice daily at that time.  Not sure why his hemoglobin would have declined this much as he is not having any overt GI bleeding.  We will update CBC to verify this along with iron panel.  Will also need to increase PPI to twice daily due to history of gastric ulcer. He is due for repeat EGD to monitor ulcer healing in 3 weeks.  We can also evaluate for H. pylori eradication at that time.  Plan: 1.  CBC and iron panel. 2.  Increase Protonix to 40 mg twice daily.  Prescription sent to pharmacy. 3.  Continue oral iron twice daily. 4.  Continue to avoid all NSAIDs. 5.  Continue to avoid alcohol. 6.  Proceed with EGD with propofol with Dr. [name] in the near future.  Requested procedure to be arranged in third or fourth week of March. The risks, benefits, and alternatives have been discussed with the patient in detail. The patient states understanding and desires to proceed.  ASA III Hold iron for 7 days prior to procedure. 7.  Follow-up after EGD.   Aliene Altes, PA-C [facility] 11/22/2020

## 2020-11-22 ENCOUNTER — Ambulatory Visit (INDEPENDENT_AMBULATORY_CARE_PROVIDER_SITE_OTHER): Payer: Self-pay | Admitting: Gastroenterology

## 2020-11-22 ENCOUNTER — Other Ambulatory Visit (HOSPITAL_COMMUNITY)
Admission: RE | Admit: 2020-11-22 | Discharge: 2020-11-22 | Disposition: A | Discharge status: Home / Self Care | Payer: Self-pay | Source: Ambulatory Visit | Attending: Gastroenterology | Admitting: Gastroenterology

## 2020-11-22 ENCOUNTER — Telehealth: Payer: Self-pay

## 2020-11-22 ENCOUNTER — Encounter: Payer: Self-pay | Admitting: Gastroenterology

## 2020-11-22 ENCOUNTER — Other Ambulatory Visit: Payer: Self-pay

## 2020-11-22 VITALS — BP 128/88 | HR 76 | Temp 97.3°F | Ht 67.0 in | Wt 167.0 lb

## 2020-11-22 DIAGNOSIS — D649 Anemia, unspecified: Secondary | ICD-10-CM

## 2020-11-22 DIAGNOSIS — Z8619 Personal history of other infectious and parasitic diseases: Secondary | ICD-10-CM

## 2020-11-22 DIAGNOSIS — K250 Acute gastric ulcer with hemorrhage: Secondary | ICD-10-CM

## 2020-11-22 LAB — FERRITIN: Ferritin: 9 ng/mL — ABNORMAL LOW (ref 24–336)

## 2020-11-22 LAB — CBC WITH DIFFERENTIAL/PLATELET
Abs Immature Granulocytes: 0.01 10*3/uL (ref 0.00–0.07)
Basophils Absolute: 0 10*3/uL (ref 0.0–0.1)
Basophils Relative: 1 %
Eosinophils Absolute: 0.1 10*3/uL (ref 0.0–0.5)
Eosinophils Relative: 2 %
HCT: 33 % — ABNORMAL LOW (ref 39.0–52.0)
Hemoglobin: 10.3 g/dL — ABNORMAL LOW (ref 13.0–17.0)
Immature Granulocytes: 0 %
Lymphocytes Relative: 33 %
Lymphs Abs: 1.3 10*3/uL (ref 0.7–4.0)
MCH: 24.7 pg — ABNORMAL LOW (ref 26.0–34.0)
MCHC: 31.2 g/dL (ref 30.0–36.0)
MCV: 79.1 fL — ABNORMAL LOW (ref 80.0–100.0)
Monocytes Absolute: 0.3 10*3/uL (ref 0.1–1.0)
Monocytes Relative: 7 %
Neutro Abs: 2.2 10*3/uL (ref 1.7–7.7)
Neutrophils Relative %: 57 %
Platelets: 308 10*3/uL (ref 150–400)
RBC: 4.17 MIL/uL — ABNORMAL LOW (ref 4.22–5.81)
RDW: 17 % — ABNORMAL HIGH (ref 11.5–15.5)
WBC: 3.9 10*3/uL — ABNORMAL LOW (ref 4.0–10.5)
nRBC: 0 % (ref 0.0–0.2)

## 2020-11-22 LAB — IRON AND TIBC
Iron: 14 ug/dL — ABNORMAL LOW (ref 45–182)
Saturation Ratios: 3 % — ABNORMAL LOW (ref 17.9–39.5)
TIBC: 416 ug/dL (ref 250–450)
UIBC: 402 ug/dL

## 2020-11-22 MED ORDER — PANTOPRAZOLE SODIUM 40 MG PO TBEC: 40.0000 mg | DELAYED_RELEASE_TABLET | Freq: Two times a day (BID) | ORAL | 3 refills | Status: DC

## 2020-11-22 NOTE — Telephone Encounter (Signed)
Pre-op/covid test appt 12/02/20 at 7:45am. Called and informed spouse. Letter mailed.

## 2020-11-22 NOTE — Patient Instructions (Signed)
Please have labs completed at [facility].  Increase Protonix to 40 mg twice daily 30 minutes before breakfast and dinner.  Continue to avoid all NSAID products including ibuprofen, Aleve, Advil, Goody fires, naproxen, and anything that says "NSAID" on the package.  Continue to avoid all alcohol.  We will arrange for you to have an upper endoscopy with Dr. [name] in the near future. Hold iron for 7 days prior to your procedure.  We will follow up with you in the office after your procedure.  Do not hesitate to call if you have any questions or concerns prior.  Aliene Altes, PA-C [facility]astroenter[facility]

## 2020-11-28 NOTE — Patient Instructions (Signed)
Steven Martinez  11/28/2020     @PREFPERIOPPHARMACY @   Your procedure is scheduled on 12/03/20.  [facility] to [facility]  Call this number if you have problems the morning of surgery:  [phone]   Remember:  Do not eat or drink after midnight.    Take these medicines the morning of surgery with A SIP OF WATER protonix    Do not wear jewelry, make-up or nail polish.  Do not wear lotions, powders, or perfumes, or deodorant.  Do not shave 48 hours prior to surgery.  Men may shave face and neck.  Do not bring valuables to the hospital.  [facility] is not responsible for any belongings or valuables.  Contacts, dentures or bridgework may not be worn into surgery.  Leave your suitcase in the car.  After surgery it may be brought to your room.  For patients admitted to the hospital, discharge time will be determined by your treatment team.  Patients discharged the day of surgery will not be allowed to drive home.   Name and phone number of your driver:   family Special instructions:    Please read over the following fact sheets that you were given. Surgical Site Infection Prevention, Anesthesia Post-op Instructions and Care and Recovery After Surgery      PATIENT INSTRUCTIONS POST-ANESTHESIA  IMMEDIATELY FOLLOWING SURGERY:  Do not drive or operate machinery for the first twenty four hours after surgery.  Do not make any important decisions for twenty four hours after surgery or while taking narcotic pain medications or sedatives.  If you develop intractable nausea and vomiting or a severe headache please notify your doctor immediately.  FOLLOW-UP:  Please make an appointment with your surgeon as instructed. You do not need to follow up with anesthesia unless specifically instructed to do so.  WOUND CARE INSTRUCTIONS (if applicable):  Keep a dry clean dressing on the anesthesia/puncture wound site if there is drainage.  Once the wound has quit draining you may  leave it open to air.  Generally you should leave the bandage intact for twenty four hours unless there is drainage.  If the epidural site drains for more than 36-48 hours please call the anesthesia department.  QUESTIONS?:  Please feel free to call your physician or the hospital operator if you have any questions, and they will be happy to assist you.      [place] anestesia se refiere a las tcnicas, los procedimientos y los medicamentos que ayudan a una persona a Buyer, retail segura y cmoda durante un procedimiento mdico o Facilities manager. La anestesia monitorizada, o sedacin, es un tipo de anestesia. El anestesista puede recomendar sedacin si a usted Software engineer un procedimiento para el que no necesita estar inconsciente. Este procedimiento puede ser:  Dwaine Gale de cataratas.  Procedimiento dental.  Biopsia.  Colonoscopa. Durante el procedimiento, puede recibir un medicamento que lo ayudar a Nurse, children's (sedante). Existen tres niveles de sedacin:  Sedacin leve. En este nivel, puede estar despierto y sentirse relajado. Podr seguir indicaciones.  Sedacin moderada. En este nivel, estar adormecido. Es posible que no recuerde el procedimiento.  Sedacin profunda. En este nivel, estar dormido. No recordar el procedimiento. Cuanta ms cantidad de USAA administren, ms profundo ser su nivel de sedacin. Dependiendo de cmo responda al procedimiento, el anestesista puede cambiar su nivel de sedacin o el tipo de anestesia para atender sus necesidades. Un anestesista lo monitorizar con Freight forwarder.  Informe al mdico acerca de lo siguiente:  Cualquier alergia que tenga.  Todos los [employer] [place], incluidos vitaminas, hierbas, gotas oftlmicas, cremas y medicamentos de venta libre.  Cualquier problema previo que usted o algn miembro de su familia hayan tenido con los anestsicos.  Cualquier trastorno de la  sangre que tenga.  Cirugas a las que se haya sometido.  Cualquier afeccin que padezca, como apnea del sueo.  Si est embarazada o podra estarlo.  Si consume cigarrillos, alcohol o drogas.  El uso de corticoesteroides, por va oral o en crema. Cules son los riesgos? En general, se trata de un procedimiento seguro. Sin embargo, pueden ocurrir complicaciones, por ejemplo:  Recibir demasiado medicamento (sedacin excesiva).  Nuseas.  Reaccin alrgica a un medicamento.  Dificultad para respirar. Si esto ocurre, es posible que se utilice un tubo respiratorio para ayudarlo a Ambulance person. El tubo se retirar cuando despierte y respire por su cuenta.  Problemas cardacos.  Problemas pulmonares.  Confusin que mejora con el tiempo (delirio de Freight forwarder). Qu ocurre antes del procedimiento? Mantenerse hidratado Siga las instrucciones del mdico acerca de mantenerse hidratado, las cuales pueden incluir lo siguiente:  [employer] horas antes del procedimiento, puede beber lquidos transparentes, como agua, jugos de fruta sin pulpa, caf negro y t solo. Restricciones en las comidas y bebidas Siga las instrucciones del mdico respecto de las restricciones de comidas o bebidas, las cuales pueden incluir lo siguiente:  Ocho horas antes del procedimiento, deje de ingerir comidas o alimentos pesados, como carne, alimentos fritos o alimentos grasos.  Seis horas antes del procedimiento, deje de ingerir comidas o alimentos livianos, como tostadas o cereales.  Seis horas antes del procedimiento, deje de beber [place] o bebidas que [employer].  Dos horas antes del procedimiento, deje de beber lquidos transparentes. Medicamentos Consulte al mdico sobre:  Quarry manager o suspender los medicamentos que [place] habitualmente. Esto es muy importante si toma medicamentos para la diabetes o anticoagulantes.  Tomar medicamentos como aspirina e ibuprofeno. Estos medicamentos pueden tener un efecto  anticoagulante en la [place]. No tome estos medicamentos a menos que el mdico se lo indique.  Usar medicamentos de venta libre, vitaminas, hierbas y suplementos. Pruebas y exmenes  Le harn un examen fsico.  Posiblemente deba realizarse anlisis de sangre para evaluar lo siguiente: ? Cmo estn funcionando los riones y Engineer, maintenance (IT). ? Con qu eficacia coagula la sangre. Instrucciones generales  Planifique que un adulto responsable lo lleve a su casa desde el hospital o la clnica.  Si va a marcharse a su casa inmediatamente despus del procedimiento, pdale a un adulto responsable que lo cuide durante el tiempo que le indiquen. Esto es importante. Qu ocurre durante el procedimiento?  Le controlarn la presin arterial, la frecuencia cardaca, la respiracin, el nivel de dolor y el estado general.  Marin Comment colocarn una va intravenosa (IV) en una vena.  Le administrarn medicamentos, segn sea necesario, para que se sienta cmodo durante el procedimiento. Esto puede implicar un cambio en el nivel de sedacin. ? Segn su edad o el procedimiento, el sedante puede administrarse:  En forma de comprimido para tragar o para colocarse en el recto.  Mediante una inyeccin en una vena o un msculo.  Como un aerosol por la Lawyer.  Se realizar el procedimiento mdico.  Durante el procedimiento, le controlarn la respiracin, la frecuencia cardaca y la presin arterial.  Una vez finalizado el procedimiento, se dejar de Best boy. El procedimiento puede variar segn el mdico y  el hospital.   Sander Nephew ocurre despus del procedimiento?  Le controlarn la presin arterial, la frecuencia cardaca, la frecuencia respiratoria y Retail buyer de oxgeno en la sangre hasta que le den el alta del hospital o la clnica.  Es posible que se sienta somnoliento, torpe o nauseoso.  Es posible que se olvide de lo que sucedi despus del procedimiento.  Es posible que vomite.  Continuar  recibiendo lquidos intravenosos (i.v.).  No conduzca ni opere maquinaria hasta que el mdico le indique que es seguro [place]. Resumen  La anestesia monitorizada se [place] para mantener al paciente cmodo durante procedimientos breves.  Informe a su mdico sobre Chief Strategy Officer o afeccin que tenga y [place] todos los medicamentos que est [place].  Antes del procedimiento, siga las instrucciones sobre cundo dejar de comer y beber y Christene Slates cambiar o suspender medicamentos.  Le controlarn la presin arterial, la frecuencia cardaca, la frecuencia respiratoria y Retail buyer de oxgeno en la sangre hasta que le den el alta del hospital o la clnica.  Planifique que un adulto responsable lo lleve a su casa desde el hospital o la clnica. Esta informacin no tiene Marine scientist el consejo del mdico. Asegrese de hacerle al mdico cualquier pregunta que tenga. Document Revised: 03/25/2020 Document Reviewed: 03/25/2020 Elsevier Patient Education  2021 [place] alta en los adultos Upper Endoscopy, Adult Una endoscopa alta es un procedimiento que permite observar dentro de la porcin alta del tracto GI (gastrointestinal). La porcin alta del tracto GI se compone de lo siguiente:  La parte del cuerpo que transporta los alimentos desde la boca hasta el estmago (esfago).  El [place].  La primera parte del intestino delgado (duodeno). Este procedimiento tambin se conoce como esofagogastroduodenoscopia (EGD) o gastrostoma. En este procedimiento, el mdico introduce un tubo delgado y flexible (endoscopio) a travs de la boca y por el esfago, hasta el [place]. Se fija una pequea cmara al extremo del tubo. La cmara captura imgenes que se reproducen en un monitor de la sala de examen. Durante este procedimiento, el mdico tambin podra extraer una pequea porcin de tejido para enviarla a un laboratorio a que la examinen con un microscopio (biopsia). El mdico podra realizar  una endoscopa alta para diagnosticar distintos tipos de cncer en la porcin alta del tracto GI. Es posible que Youth worker procedimiento para Scientist, research (physical sciences) causa de ciertas afecciones, como:  Dolor de [place].  Acidez estomacal.  Dolor o problemas al tragar.  Nuseas y vmitos.  Hemorragia estomacal.  lceras estomacales. Informe al mdico acerca de lo siguiente:  Cualquier alergia que tenga.  Todos los [employer], incluidos vitaminas, hierbas, gotas oftlmicas, cremas y medicamentos de venta libre.  Cualquier problema previo que usted o algn miembro de su familia haya tenido con los anestsicos.  Cualquier trastorno de la sangre que tenga.  Cirugas a las que se haya sometido.  Cualquier afeccin mdica que tenga.  Si est embarazada o podra estarlo. Cules son los riesgos? En general, se trata de un procedimiento seguro. Sin embargo, pueden ocurrir complicaciones, por ejemplo:  Infeccin.  Sangrado.  Reacciones alrgicas a los medicamentos.  Un desgarro u orificio (perforacin) del esfago, el estmago o el duodeno. Qu ocurre antes del procedimiento? Hidratacin Siga las indicaciones del mdico acerca de mantenerse hidratado, las cuales pueden incluir lo siguiente:  Hasta 2horas antes del procedimiento, puede beber lquidos transparentes, como agua, jugos de fruta sin pulpa, caf negro y t solo.   Restricciones en las comidas  y bebidas Siga las indicaciones del mdico respecto de las comidas y bebidas, las cuales pueden incluir lo siguiente:  8 horas antes del procedimiento, no coma alimentos pesados, por ejemplo, carnes, alimentos con alto contenido graso o fritos.  6 horas antes del procedimiento, deje de ingerir comidas o alimentos livianos, como tostadas o cereales.  6 horas antes del procedimiento, deje de tomar leche o bebidas que [employer].  2 horas antes del procedimiento, deje de beber lquidos  transparentes. Medicamentos Consulte al mdico sobre:  Quarry manager o suspender los medicamentos que toma habitualmente. Esto es muy importante si toma medicamentos para la diabetes o anticoagulantes.  Tomar medicamentos como aspirina e ibuprofeno. Estos medicamentos pueden tener un efecto anticoagulante en la [place]. No tome estos medicamentos a menos que el mdico se lo indique.  Tomar medicamentos de [employer], vitaminas, hierbas y suplementos. Indicaciones generales  Haga que alguien lo lleve a su casa desde el hospital o la clnica.  Si se ir a su casa inmediatamente despus del procedimiento, pdale a alguien que se quede con usted durante 24horas.  Pregntele al mdico qu medidas se tomarn para ayudar a prevenir una infeccin. Qu ocurre durante el procedimiento?  Le colocarn una va intravenosa en una de las venas.  Pueden administrarle uno o ms de los siguientes medicamentos: ? Un medicamento para ayudarlo a relajarse (sedante). ? Un medicamento para adormecer la garganta (anestesia local).  Deber recostarse del costado izquierdo sobre una camilla.  El mdico har pasar el endoscopio a travs de la boca y Medical illustrator.  Elmdico utilizar el endoscopio para ver el interior de su esfago, estmago y [place]. Podran realizarse biopsias.  El endoscopio se Charity fundraiser. El procedimiento puede variar segn el mdico y el hospital.   Sander Nephew ocurre despus del procedimiento?  Le controlarn la presin arterial, la frecuencia cardaca, la frecuencia respiratoria y Retail buyer de oxgeno en la sangre hasta que le den el alta del hospital o la clnica.  No conduzca durante 24horas si le administraron un sedante durante el procedimiento.  Cuando la garganta ya no est adormecida, le pueden dar lquido para beber.  Es su responsabilidad retirar [employer] del procedimiento. Pregntele al mdico o a alguien del departamento donde se realice el procedimiento cundo estarn  [place]. Resumen  Una endoscopa alta es un procedimiento que permite observar dentro de la porcin alta del tracto GI.  Durante el procedimiento, le colocarn una va intravenosa en una de las venas. Es posible que le administren un medicamento para ayudarlo a Nurse, children's.  Usarn un medicamento para anestesiarle la garganta.  Le introducirn el endoscopio por la boca y lo llevarn Programmer, applications. Esta informacin no tiene Marine scientist el consejo del mdico. Asegrese de hacerle al mdico cualquier pregunta que tenga. Document Revised: 03/17/2018 Document Reviewed: 03/17/2018 Elsevier Patient Education  2021 [employer].

## 2020-12-02 ENCOUNTER — Other Ambulatory Visit (HOSPITAL_COMMUNITY)
Admission: RE | Admit: 2020-12-02 | Discharge: 2020-12-02 | Disposition: A | Discharge status: Home / Self Care | Payer: Self-pay | Source: Ambulatory Visit | Attending: Internal Medicine | Admitting: Internal Medicine

## 2020-12-02 ENCOUNTER — Encounter (HOSPITAL_COMMUNITY)
Admission: RE | Admit: 2020-12-02 | Discharge: 2020-12-02 | Disposition: A | Discharge status: Home / Self Care | Payer: Self-pay | Source: Ambulatory Visit | Attending: Internal Medicine | Admitting: Internal Medicine

## 2020-12-02 ENCOUNTER — Encounter (HOSPITAL_COMMUNITY): Payer: Self-pay

## 2020-12-02 ENCOUNTER — Other Ambulatory Visit: Payer: Self-pay

## 2020-12-02 DIAGNOSIS — Z20822 Contact with and (suspected) exposure to covid-19: Secondary | ICD-10-CM | POA: Insufficient documentation

## 2020-12-02 DIAGNOSIS — Z01812 Encounter for preprocedural laboratory examination: Secondary | ICD-10-CM | POA: Insufficient documentation

## 2020-12-02 LAB — CBC WITH DIFFERENTIAL/PLATELET
Abs Immature Granulocytes: 0 10*3/uL (ref 0.00–0.07)
Basophils Absolute: 0.1 10*3/uL (ref 0.0–0.1)
Basophils Relative: 1 %
Eosinophils Absolute: 0.1 10*3/uL (ref 0.0–0.5)
Eosinophils Relative: 2 %
HCT: 33.9 % — ABNORMAL LOW (ref 39.0–52.0)
Hemoglobin: 10.3 g/dL — ABNORMAL LOW (ref 13.0–17.0)
Immature Granulocytes: 0 %
Lymphocytes Relative: 40 %
Lymphs Abs: 1.5 10*3/uL (ref 0.7–4.0)
MCH: 23.8 pg — ABNORMAL LOW (ref 26.0–34.0)
MCHC: 30.4 g/dL (ref 30.0–36.0)
MCV: 78.5 fL — ABNORMAL LOW (ref 80.0–100.0)
Monocytes Absolute: 0.3 10*3/uL (ref 0.1–1.0)
Monocytes Relative: 7 %
Neutro Abs: 1.8 10*3/uL (ref 1.7–7.7)
Neutrophils Relative %: 50 %
Platelets: 311 10*3/uL (ref 150–400)
RBC: 4.32 MIL/uL (ref 4.22–5.81)
RDW: 17.1 % — ABNORMAL HIGH (ref 11.5–15.5)
WBC: 3.8 10*3/uL — ABNORMAL LOW (ref 4.0–10.5)
nRBC: 0 % (ref 0.0–0.2)

## 2020-12-02 LAB — COMPREHENSIVE METABOLIC PANEL
ALT: 12 U/L (ref 0–44)
AST: 15 U/L (ref 15–41)
Albumin: 4 g/dL (ref 3.5–5.0)
Alkaline Phosphatase: 62 U/L (ref 38–126)
Anion gap: 9 (ref 5–15)
BUN: 10 mg/dL (ref 6–20)
CO2: 24 mmol/L (ref 22–32)
Calcium: 8.9 mg/dL (ref 8.9–10.3)
Chloride: 106 mmol/L (ref 98–111)
Creatinine, Ser: 0.83 mg/dL (ref 0.61–1.24)
GFR, Estimated: >60 mL/min (ref >60)
Glucose, Bld: 111 mg/dL — ABNORMAL HIGH (ref 70–99)
Potassium: 3.7 mmol/L (ref 3.5–5.1)
Sodium: 139 mmol/L (ref 135–145)
Total Bilirubin: 0.4 mg/dL (ref 0.3–1.2)
Total Protein: 7.2 g/dL (ref 6.5–8.1)

## 2020-12-02 LAB — SARS CORONAVIRUS 2 (TAT 6-24 HRS): SARS Coronavirus 2: NEGATIVE

## 2020-12-02 NOTE — Progress Notes (Signed)
Mr Vashti Hey requested his wife to be his interpreter.  I had the interpreter line video in PAT office.  He told his wife he understood her better than the live video interpreter.   Waiver of Right to free interpreter service signed

## 2020-12-03 ENCOUNTER — Ambulatory Visit (HOSPITAL_COMMUNITY): Payer: Self-pay | Admitting: Anesthesiology

## 2020-12-03 ENCOUNTER — Ambulatory Visit (HOSPITAL_COMMUNITY)
Admission: RE | Admit: 2020-12-03 | Discharge: 2020-12-03 | Disposition: A | Discharge status: Home / Self Care | Payer: Self-pay | Attending: Internal Medicine | Admitting: Internal Medicine

## 2020-12-03 ENCOUNTER — Other Ambulatory Visit: Payer: Self-pay

## 2020-12-03 ENCOUNTER — Encounter (HOSPITAL_COMMUNITY): Payer: Self-pay

## 2020-12-03 ENCOUNTER — Encounter (HOSPITAL_COMMUNITY)
Admission: RE | Disposition: A | Discharge status: Home / Self Care | Payer: Self-pay | Source: Home / Self Care | Attending: Internal Medicine

## 2020-12-03 DIAGNOSIS — K259 Gastric ulcer, unspecified as acute or chronic, without hemorrhage or perforation: Secondary | ICD-10-CM | POA: Insufficient documentation

## 2020-12-03 DIAGNOSIS — K219 Gastro-esophageal reflux disease without esophagitis: Secondary | ICD-10-CM | POA: Insufficient documentation

## 2020-12-03 DIAGNOSIS — K279 Peptic ulcer, site unspecified, unspecified as acute or chronic, without hemorrhage or perforation: Secondary | ICD-10-CM

## 2020-12-03 DIAGNOSIS — Z79899 Other long term (current) drug therapy: Secondary | ICD-10-CM | POA: Insufficient documentation

## 2020-12-03 DIAGNOSIS — Z8711 Personal history of peptic ulcer disease: Secondary | ICD-10-CM | POA: Insufficient documentation

## 2020-12-03 DIAGNOSIS — K2970 Gastritis, unspecified, without bleeding: Secondary | ICD-10-CM

## 2020-12-03 DIAGNOSIS — K2950 Unspecified chronic gastritis without bleeding: Secondary | ICD-10-CM | POA: Insufficient documentation

## 2020-12-03 DIAGNOSIS — K31A11 Gastric intestinal metaplasia without dysplasia, involving the antrum: Secondary | ICD-10-CM | POA: Insufficient documentation

## 2020-12-03 DIAGNOSIS — K250 Acute gastric ulcer with hemorrhage: Secondary | ICD-10-CM

## 2020-12-03 HISTORY — PX: ESOPHAGOGASTRODUODENOSCOPY (EGD) WITH PROPOFOL: SHX5813

## 2020-12-03 HISTORY — PX: BIOPSY: SHX5522

## 2020-12-03 SURGERY — ESOPHAGOGASTRODUODENOSCOPY (EGD) WITH PROPOFOL
Anesthesia: General

## 2020-12-03 MED ORDER — PANTOPRAZOLE SODIUM 40 MG PO TBEC: 40.0000 mg | DELAYED_RELEASE_TABLET | Freq: Two times a day (BID) | ORAL | 5 refills | Status: DC

## 2020-12-03 MED ORDER — MIDAZOLAM HCL 2 MG/2ML IJ SOLN
INTRAMUSCULAR | Status: AC
  Filled 2020-12-03: qty 2

## 2020-12-03 MED ORDER — LACTATED RINGERS IV SOLN: INTRAVENOUS | Status: DC

## 2020-12-03 MED ORDER — MIDAZOLAM HCL 2 MG/2ML IJ SOLN
INTRAMUSCULAR | Status: DC | PRN
  Administered 2020-12-03: 2 mg via INTRAVENOUS

## 2020-12-03 MED ORDER — LIDOCAINE HCL (CARDIAC) PF 100 MG/5ML IV SOSY
PREFILLED_SYRINGE | INTRAVENOUS | Status: DC | PRN
  Administered 2020-12-03: 50 mg via INTRAVENOUS

## 2020-12-03 MED ORDER — PROPOFOL 10 MG/ML IV BOLUS
INTRAVENOUS | Status: DC | PRN
  Administered 2020-12-03: 130 mg via INTRAVENOUS

## 2020-12-03 NOTE — Anesthesia Preprocedure Evaluation (Signed)
Anesthesia Evaluation  Patient identified by MRN, date of birth, ID band Patient awake    Reviewed: Allergy & Precautions, NPO status , Patient's Chart, lab work & pertinent test results  Airway Mallampati: II  TM Distance: >3 FB Neck ROM: Full    Dental  (+) Dental Advisory Given, Teeth Intact   Pulmonary neg pulmonary ROS,    Pulmonary exam normal breath sounds clear to auscultation       Cardiovascular Exercise Tolerance: Good Normal cardiovascular exam Rhythm:Regular Rate:Normal     Neuro/Psych PSYCHIATRIC DISORDERS negative neurological ROS     GI/Hepatic PUD, GERD  Medicated and Controlled,(+)     substance abuse (alcohol abuse)  alcohol use,   Endo/Other  negative endocrine ROS  Renal/GU negative Renal ROS     Musculoskeletal negative musculoskeletal ROS (+)   Abdominal   Peds  Hematology  (+) anemia ,   Anesthesia Other Findings   Reproductive/Obstetrics negative OB ROS                             Anesthesia Physical Anesthesia Plan  ASA: III  Anesthesia Plan: General   Post-op Pain Management:    Induction: Intravenous  PONV Risk Score and Plan: TIVA and Propofol infusion  Airway Management Planned: Nasal Cannula and Natural Airway  Additional Equipment:   Intra-op Plan:   Post-operative Plan:   Informed Consent: I have reviewed the patients History and Physical, chart, labs and discussed the procedure including the risks, benefits and alternatives for the proposed anesthesia with the patient or authorized representative who has indicated his/her understanding and acceptance.     Dental advisory given  Plan Discussed with: Surgeon  Anesthesia Plan Comments:         Anesthesia Quick Evaluation

## 2020-12-03 NOTE — Anesthesia Procedure Notes (Signed)
Date/Time: 12/03/2020 8:39 AM Performed by: Orlie Dakin, CRNA Pre-anesthesia Checklist: Patient identified, Emergency Drugs available, Suction available and Patient being monitored Patient Re-evaluated:Patient Re-evaluated prior to induction Oxygen Delivery Method: Nasal cannula Induction Type: IV induction Placement Confirmation: positive ETCO2

## 2020-12-03 NOTE — Interval H&P Note (Signed)
History and Physical Interval Note:  12/03/2020 8:26 AM  [facility]  has presented today for surgery, with the diagnosis of gastric ulcer, anemia, histroy h.pylori.  The various methods of treatment have been discussed with the patient and family. After consideration of risks, benefits and other options for treatment, the patient has consented to  Procedure(s) with comments: ESOPHAGOGASTRODUODENOSCOPY (EGD) WITH PROPOFOL (N/A) - AM as a surgical intervention.  The patient's history has been reviewed, patient examined, no change in status, stable for surgery.  I have reviewed the patient's chart and labs.  Questions were answered to the patient's satisfaction.     Eloise Harman

## 2020-12-03 NOTE — Op Note (Signed)
[facility] Patient Name: Steven Martinez Procedure Date: 12/03/2020 8:27 AM MRN: [id number] Date of Birth: 01/19/1991 Attending MD: Elon Alas. [name] DO CSN: [id number] Age: 30 Admit Type: Outpatient Procedure:                Upper GI endoscopy Indications:              Follow-up of peptic ulcer Providers:                Elon Alas. [name], DO, Janeece Riggers, RN, Nelma Rothman,                            Technician Referring MD:              Medicines:                See the Anesthesia note for documentation of the                            administered medications Complications:            No immediate complications. Estimated Blood Loss:     Estimated blood loss was minimal. Procedure:                Pre-Anesthesia Assessment:                           - The anesthesia plan was to use monitored                            anesthesia care (MAC).                           After obtaining informed consent, the endoscope was                            passed under direct vision. Throughout the                            procedure, the patient's blood pressure, pulse, and                            oxygen saturations were monitored continuously. The                            GIF-H190 ([id number]) was introduced through the                            mouth, and advanced to the second part of duodenum.                            The upper GI endoscopy was accomplished without                            difficulty. The patient tolerated the procedure                            well. Scope In: 8:37:48  AM Scope Out: 8:41:30 AM Total Procedure Duration: 0 hours 3 minutes 42 seconds  Findings:      There is no endoscopic evidence of Barrett's esophagus, bleeding, areas       of erosion, esophagitis, hiatal hernia, ulcerations or varices in the       entire esophagus.      One non-obstructing non-bleeding cratered gastric ulcer with a clean       ulcer base (Forrest Class III) was found in  the gastric antrum. The       lesion was 15 mm in largest dimension. There is no evidence of       perforation. Biopsies were taken with a cold forceps for histology.      Diffuse mild inflammation characterized by erythema was found in the       entire examined stomach. Biopsies were taken with a cold forceps for       Helicobacter pylori testing.      The duodenal bulb, first portion of the duodenum and second portion of       the duodenum were normal. Impression:               - Non-obstructing non-bleeding gastric ulcer with a                            clean ulcer base (Forrest Class III). There is no                            evidence of perforation. Biopsied.                           - Gastritis. Biopsied.                           - Normal duodenal bulb, first portion of the                            duodenum and second portion of the duodenum. Moderate Sedation:      Per Anesthesia Care Recommendation:           - Patient has a contact number available for                            emergencies. The signs and symptoms of potential                            delayed complications were discussed with the                            patient. Return to normal activities tomorrow.                            Written discharge instructions were provided to the                            patient.                           - Resume previous diet.                           -  Continue present medications.                           - Await pathology results.                           - Repeat upper endoscopy in 3 months for                            surveillance based on pathology results.                           - Use a proton pump inhibitor PO BID.                           - No ibuprofen, naproxen, or other non-steroidal                            anti-inflammatory drugs. Procedure Code(s):        --- Professional ---                           [phone], Esophagogastroduodenoscopy,  flexible,                            transoral; with biopsy, single or multiple Diagnosis Code(s):        --- Professional ---                           K25.9, Gastric ulcer, unspecified as acute or                            chronic, without hemorrhage or perforation                           K29.70, Gastritis, unspecified, without bleeding                           K27.9, Peptic ulcer, site unspecified, unspecified                            as acute or chronic, without hemorrhage or                            perforation CPT copyright 2019 American Medical Association. All rights reserved. The codes documented in this report are preliminary and upon coder review may  be revised to meet current compliance requirements. Elon Alas. [name], DO [place] [name], DO 12/03/2020 8:46:48 AM This report has been signed electronically. Number of Addenda: 0

## 2020-12-03 NOTE — Discharge Instructions (Signed)
EGD Discharge instructions Please read the instructions outlined below and refer to this sheet in the next few weeks. These discharge instructions provide you with general information on caring for yourself after you leave the hospital. Your doctor may also give you specific instructions. While your treatment has been planned according to the most current medical practices available, unavoidable complications occasionally occur. If you have any problems or questions after discharge, please call your doctor. ACTIVITY  You may resume your regular activity but move at a slower pace for the next 24 hours.   Take frequent rest periods for the next 24 hours.   Walking will help expel (get rid of) the air and reduce the bloated feeling in your abdomen.   No driving for 24 hours (because of the anesthesia (medicine) used during the test).   You may shower.   Do not sign any important legal documents or operate any machinery for 24 hours (because of the anesthesia used during the test).  NUTRITION  Drink plenty of fluids.   You may resume your normal diet.   Begin with a light meal and progress to your normal diet.   Avoid alcoholic beverages for 24 hours or as instructed by your caregiver.  MEDICATIONS  You may resume your normal medications unless your caregiver tells you otherwise.  WHAT YOU CAN EXPECT TODAY  You may experience abdominal discomfort such as a feeling of fullness or "gas" pains.  FOLLOW-UP  Your doctor will discuss the results of your test with you.  SEEK IMMEDIATE MEDICAL ATTENTION IF ANY OF THE FOLLOWING OCCUR:  Excessive nausea (feeling sick to your stomach) and/or vomiting.   Severe abdominal pain and distention (swelling).   Trouble swallowing.   Temperature over 101 F (37.8 C).   Rectal bleeding or vomiting of blood.    You still have a large ulcer in your stomach. I took biopsies today to rule out ongoing infection with H. pylori. I am going to  increase your pantoprazole to twice daily. Avoid all NSAIDs. Follow up with GI in 2-3 months.   I hope you have a great rest of your week!  Steven Martinez. [name], D.O. Gastroenterology and Hepatology [facility]

## 2020-12-03 NOTE — Transfer of Care (Signed)
Immediate Anesthesia Transfer of Care Note  Patient: Steven Martinez  Procedure(s) Performed: ESOPHAGOGASTRODUODENOSCOPY (EGD) WITH PROPOFOL (N/A ) BIOPSY  Patient Location: Short Stay  Anesthesia Type:General  Level of Consciousness: drowsy  Airway & Oxygen Therapy: Patient Spontanous Breathing  Post-op Assessment: Report given to RN and Post -op Vital signs reviewed and stable  Post vital signs: Reviewed and stable  Last Vitals:  Vitals Value Taken Time  BP    Temp    Pulse    Resp    SpO2      Last Pain:  Vitals:   12/03/20 0827  TempSrc:   PainSc: 0-No pain         Complications: No complications documented.

## 2020-12-04 LAB — SURGICAL PATHOLOGY

## 2020-12-04 NOTE — Anesthesia Postprocedure Evaluation (Signed)
Anesthesia Post Note  Patient: Beaumont Austad  Procedure(s) Performed: ESOPHAGOGASTRODUODENOSCOPY (EGD) WITH PROPOFOL (N/A ) BIOPSY  Patient location during evaluation: Phase II Anesthesia Type: General Level of consciousness: awake and alert and oriented Pain management: pain level controlled Vital Signs Assessment: post-procedure vital signs reviewed and stable Respiratory status: respiratory function stable and spontaneous breathing Cardiovascular status: blood pressure returned to baseline and stable Postop Assessment: no apparent nausea or vomiting Anesthetic complications: no   No complications documented.   Last Vitals:  Vitals:   12/03/20 0734 12/03/20 0848  BP: (!) 147/95 111/68  Pulse: 71 74  Resp: 17 17  Temp: 36.9 C 36.6 C  SpO2: 100% 96%    Last Pain:  Vitals:   12/03/20 0848  TempSrc: Oral  PainSc: Asleep                 Marvia Troost C Sumiya Mamaril

## 2020-12-10 ENCOUNTER — Encounter (HOSPITAL_COMMUNITY): Payer: Self-pay | Admitting: Internal Medicine

## 2021-02-01 NOTE — Progress Notes (Signed)
Referring Provider: [place], [place] Primary Care Physician:  Leonie Douglas, MD Primary GI Physician: Dr. [name]  Chief Complaint  Patient presents with  . gastric ulcer    Doing ok    HPI:   Steven Martinez is a 30 y.o. male presenting today for follow-up s/p surveillance EGD for gastric ulcer with visible vessel.    History significant for hospitalization in January 2022 for hematemesis, melena, acute blood loss anemia due to upper GI bleed in the setting of alcohol abuse.  Also with chronic GERD.  EGD at that time with nonbleeding, nonobstructing gastric ulcer with visible vessel s/p bipolar cautery.  Also with positive H. pylori IgG s/p treatment with Prevpac.  Last seen in our office 11/22/2020.  He was feeling overall improved.  Mild epigastric abdominal pain daily to every other day improved by eating.  Taking Protonix 40 mg daily.  Had stopped drinking alcohol.  Plan to update labs, increase PPI to twice daily due to history of gastric ulcer, repeat EGD.  Labs 11/22/2020: Hemoglobin 10.3 (L), iron 14 (L), saturation 3% (L), ferritin 9 (L).   EGD 12/03/2020: Nonobstructing, nonbleeding gastric ulcer with clean base s/p biopsy, gastritis biopsied, normal examined duodenum. Pathology with granulation tissue consistent with ulcer, mild chronic gastritis and focal intestinal metaplasia, H. pylori negative.  Recommend repeat EGD in 3 months or so.   Today: No abdominal pain, nausea, or vomiting. No GERD symptoms or dysphagia. No brbpr or melena.  Bowels are moving daily. No NSAIDs. No alcohol.  He has continued on Protonix 40 mg twice daily and iron twice daily.  He has been very restrictive with his diet.  He was not sure what he could eat due to the ulcer.  He is primarily eating vegetables and beans.  Past Medical History:  Diagnosis Date  . Acute upper GI bleed 10/13/2020   gastric ulcer with visible vessel  . Alcohol abuse 10/13/2020   Quit Jan 2022  . GERD  (gastroesophageal reflux disease)   . Positive H. pylori test 09/2020   H pylori IgG + Jan 2022 s/p treatment with Prevpac. EGD March 2022 with gastric biopsy negative for H. pylori.     Past Surgical History:  Procedure Laterality Date  . BIOPSY  12/03/2020   Procedure: BIOPSY;  Surgeon: Eloise Harman, DO;  Location: [facility];  Service: Endoscopy;;  . ESOPHAGOGASTRODUODENOSCOPY (EGD) WITH PROPOFOL N/A 10/14/2020    Surgeon: Eloise Harman, DO;  one non-bleeding, non-obstructing gastric ulcer with visible vessel s/p bipolar cautery, normal examined esophagus and duodenum.   . ESOPHAGOGASTRODUODENOSCOPY (EGD) WITH PROPOFOL N/A 12/03/2020    Surgeon: Eloise Harman, DO;   nonobstructing, nonbleeding gastric ulcer with clean base s/p biopsied, gastritis biopsied, normal examined duodenum.  Pathology with granulation tissue consistent with ulcer, mild chronic gastritis with focal intestinal metaplasia, H. pylori negative.  Repeat in 3 months.    Current Outpatient Medications  Medication Sig Dispense Refill  . ferrous sulfate 325 (65 FE) MG tablet Take 325 mg by mouth in the morning and at bedtime.    . Multiple Vitamin (MULTIVITAMIN) tablet Take 1 tablet by mouth daily.    . pantoprazole (PROTONIX) 40 MG tablet Take 1 tablet (40 mg total) by mouth 2 (two) times daily before a meal. 60 tablet 5   No current facility-administered medications for this visit.    Allergies as of 02/03/2021  . (No Known Allergies)    Family History  Problem Relation Age  of Onset  . Diabetes Mellitus II Mother   . Hypertension Mother   . Colon cancer Neg Hx   . Colon polyps Neg Hx     Social History   Socioeconomic History  . Marital status: Married    Spouse name: Not on file  . Number of children: Not on file  . Years of education: Not on file  . Highest education level: Not on file  Occupational History  . Not on file  Tobacco Use  . Smoking status: Never Smoker  . Smokeless  tobacco: Never Used  Substance and Sexual Activity  . Alcohol use: Not Currently    Alcohol/week: 57.0 - 58.0 standard drinks    Types: 54 Cans of beer, 3 - 4 Shots of liquor per week    Comment: Quit January 2022. Used to drink sixpack daily and 12-15 beers on Saturdays and Sundays.   . Drug use: Never  . Sexual activity: Not on file  Other Topics Concern  . Not on file  Social History Narrative  . Not on file   Social Determinants of Health   Financial Resource Strain: Not on file  Food Insecurity: Not on file  Transportation Needs: Not on file  Physical Activity: Not on file  Stress: Not on file  Social Connections: Not on file    Review of Systems: Gen: Denies fever, chills, cold or flulike symptoms, presyncope, syncope. CV: Denies chest pain or palpitations. Resp: Denies dyspnea or cough. GI: See HPI. Heme: See HPI  Physical Exam: BP 135/87   Pulse 70   Temp (!) 97.3 F (36.3 C) (Temporal)   Ht 5\' 7"  (1.702 m)   Wt 152 lb 3.2 oz (69 kg)   BMI 23.84 kg/m  General:   Alert and oriented. No distress noted. Pleasant and cooperative.  Head:  Normocephalic and atraumatic. Eyes:  Conjuctiva clear without scleral icterus. Heart:  S1, S2 present without murmurs appreciated. Lungs:  Clear to auscultation bilaterally. No wheezes, rales, or rhonchi. No distress.  Abdomen:  +BS, soft, non-tender and non-distended. No rebound or guarding. No HSM or masses noted. Msk:  Symmetrical without gross deformities. Normal posture. Extremities:  Without edema. Neurologic:  Alert and  oriented x4 Psych: Normal mood and affect.   Assessment: 30 year old male with history of GERD, acute blood loss anemia secondary to upper GI bleed in the setting of gastric ulcer with visible vessel, alcohol abuse, and H. pylori in January 2022.  He is presenting today for follow-up s/p surveillance EGD in March 2022.  Gastric ulcer: Nonbleeding, nonobstructing gastric ulcer with visible vessel s/p  bipolar cautery in January 2022. Likely secondary to H. pylori and chronic alcohol use. Surveillance EGD 12/03/2020 with nonobstructing, nonbleeding gastric ulcer with clean base s/p biopsied, gastritis biopsied, normal examined duodenum.  Pathology with granulation tissue consistent with ulcer, mild chronic gastritis with focal intestinal metaplasia, H. pylori negative.  Recommended repeat EGD in about 3 months.  He has continued on PPI twice daily and is clinically doing very well without alarm symptoms. Denies NSAIDs and stopped drinking all alcohol in January. We will arrange for him to have surveillance EGD in the near future.  Anemia: Iron deficiency anemia in the setting of acute upper GI bleed secondary to gastric ulcer with visible vessel in January 2022.  Hemoglobin improved to 10.3 in March 2022 with iron 14, saturation 3%, ferritin 9.  He has maintained on oral iron twice daily and is without overt GI bleeding.  Denies NSAIDs.  Currently due for surveillance EGD which we will arrange in the near future.  We will also update CBC and iron panel.  GERD: Well-controlled on Protonix 40 mg twice daily.  We may be able to reduce PPI to once daily pending surveillance EGD with documentation of healed gastric ulcer.  H. pylori: H. pylori IgG + January 2022 s/p treatment with Prevpac.  Surveillance EGD March 2022 with gastric biopsies negative for H. Pylori.   Plan: 1.  CBC and iron panel with ferritin.  2.  Proceed with EGD with propofol with Dr. [name] in the near future. The risks, benefits, and alternatives have been discussed with the patient in detail. The patient states understanding and desires to proceed.  ASA III Low iron x7 days prior to procedure.  3.  Continue Protonix 40 mg twice daily.  4.  Continue oral iron twice daily.  5.  Continue to avoid all NSAIDs.  6.  Continue to avoid alcohol.  7.  Advised that he does not have any specific dietary restrictions and may resume a  normal diet.  8.  Follow-up after EGD.    Aliene Altes, PA-C [facility] 02/03/2021

## 2021-02-03 ENCOUNTER — Other Ambulatory Visit: Payer: Self-pay

## 2021-02-03 ENCOUNTER — Encounter: Payer: Self-pay | Admitting: Gastroenterology

## 2021-02-03 ENCOUNTER — Other Ambulatory Visit (HOSPITAL_COMMUNITY)
Admission: RE | Admit: 2021-02-03 | Discharge: 2021-02-03 | Disposition: A | Discharge status: Home / Self Care | Payer: Self-pay | Source: Ambulatory Visit | Attending: Gastroenterology | Admitting: Gastroenterology

## 2021-02-03 ENCOUNTER — Ambulatory Visit (INDEPENDENT_AMBULATORY_CARE_PROVIDER_SITE_OTHER): Payer: Self-pay | Admitting: Gastroenterology

## 2021-02-03 VITALS — BP 135/87 | HR 70 | Temp 97.3°F | Ht 67.0 in | Wt 152.2 lb

## 2021-02-03 DIAGNOSIS — D649 Anemia, unspecified: Secondary | ICD-10-CM

## 2021-02-03 DIAGNOSIS — K219 Gastro-esophageal reflux disease without esophagitis: Secondary | ICD-10-CM

## 2021-02-03 DIAGNOSIS — K250 Acute gastric ulcer with hemorrhage: Secondary | ICD-10-CM

## 2021-02-03 DIAGNOSIS — Z8619 Personal history of other infectious and parasitic diseases: Secondary | ICD-10-CM

## 2021-02-03 LAB — CBC WITH DIFFERENTIAL/PLATELET
Abs Immature Granulocytes: 0.01 10*3/uL (ref 0.00–0.07)
Basophils Absolute: 0.1 10*3/uL (ref 0.0–0.1)
Basophils Relative: 1 %
Eosinophils Absolute: 0.1 10*3/uL (ref 0.0–0.5)
Eosinophils Relative: 3 %
HCT: 43 % (ref 39.0–52.0)
Hemoglobin: 13.8 g/dL (ref 13.0–17.0)
Immature Granulocytes: 0 %
Lymphocytes Relative: 23 %
Lymphs Abs: 0.9 10*3/uL (ref 0.7–4.0)
MCH: 25 pg — ABNORMAL LOW (ref 26.0–34.0)
MCHC: 32.1 g/dL (ref 30.0–36.0)
MCV: 77.9 fL — ABNORMAL LOW (ref 80.0–100.0)
Monocytes Absolute: 0.3 10*3/uL (ref 0.1–1.0)
Monocytes Relative: 6 %
Neutro Abs: 2.6 10*3/uL (ref 1.7–7.7)
Neutrophils Relative %: 67 %
Platelets: 210 10*3/uL (ref 150–400)
RBC: 5.52 MIL/uL (ref 4.22–5.81)
RDW: 22 % — ABNORMAL HIGH (ref 11.5–15.5)
WBC: 4 10*3/uL (ref 4.0–10.5)
nRBC: 0 % (ref 0.0–0.2)

## 2021-02-03 LAB — FERRITIN: Ferritin: 12 ng/mL — ABNORMAL LOW (ref 24–336)

## 2021-02-03 LAB — IRON AND TIBC
Iron: 27 ug/dL — ABNORMAL LOW (ref 45–182)
Saturation Ratios: 8 % — ABNORMAL LOW (ref 17.9–39.5)
TIBC: 348 ug/dL (ref 250–450)
UIBC: 321 ug/dL

## 2021-02-03 NOTE — Patient Instructions (Addendum)
Please have labs completed at [facility].  We will arrange for you to have an upper endoscopy with Dr. [name] in the near future. Hold iron x 7 days prior to your procedure.  Continue taking pantoprazole 40 mg twice daily 30 minutes before breakfast and dinner.  Continue taking iron twice daily.  Continue to avoid alcohol and NSAID products.   You have no other specific dietary restrictions. You may resume eating a normal diet.    We will plan to see you back after your upper endoscopy.  Do not hesitate to call if you have any questions or concerns prior.   Aliene Altes, PA-C [facility]

## 2021-02-25 ENCOUNTER — Telehealth: Payer: Self-pay

## 2021-02-25 NOTE — Telephone Encounter (Signed)
Tried to call pt to schedule EGD w/Propofol ASA 3 with Dr. [name], LMOVM for return call.

## 2021-03-04 NOTE — Telephone Encounter (Signed)
Pre-op appt 04/09/21. Appt letter mailed with procedure instructions.

## 2021-03-04 NOTE — Telephone Encounter (Signed)
Spoke to pt's wife, EGD scheduled for 04/14/21 at 8:00am. Orders entered.

## 2021-04-07 NOTE — Patient Instructions (Signed)
[facility] main Entrance  04/14/2021 at 6:45 am  Nothing to eat or drink after midnight  You may take Pantoprazole with a sip of water  Have someone to drive you home and stay with you after your procedure   Endoscopa alta en los adultos, cuidados posteriores Upper Endoscopy, Adult, Care After Esta hoja le brinda informacin sobre cmo cuidarse despus del procedimiento. Su mdico tambin podr darle instrucciones ms especficas. Comunquese con elmdico si tiene problemas o preguntas. Qu puedo esperar despus del procedimiento? Despus del procedimiento, es normal tener los siguientes sntomas: Dolor de Investment banker, operational. Leve dolor o molestias en el estmago. Meteorismo. Nuseas. Siga estas indicaciones en su casa:  Siga las indicaciones del mdico respecto de qu comer o beber despus del procedimiento. Retome sus actividades normales como se lo haya indicado el mdico. Pregntele al mdico qu actividades son seguras para usted. Use los medicamentos de venta libre y los recetados solamente como te lo haya indicado el mdico. Si le administraron un sedante durante el procedimiento, puede afectarlo por varias horas. No conduzca ni opere maquinaria hasta que el mdico le indique que es seguro [place]. Concurra a todas las visitas de seguimiento como se lo haya indicado el mdico. Esto es importante. Comunquese con un mdico si tiene: Dolor de garganta que dura ms de Optician, dispensing. Dificultad para tragar. Solicite ayuda inmediatamente si: Vomita sangre o el vmito tiene un aspecto similar al poso del caf. Tiene los siguientes sntomas: Cristy Hilts. Heces con sangre o de aspecto negro alquitranado. Dolor de garganta muy intenso o no puede tragar. Dificultad para respirar. Dolor intenso en el pecho o el abdomen. Resumen Despus del procedimiento, es frecuente sentir dolor de garganta, molestias leves en el estmago, distensin abdominal y nuseas. Si le administraron un sedante durante el  procedimiento, puede afectarlo por varias horas. No conduzca ni opere maquinaria hasta que el mdico le indique que es seguro [place]. Siga las instrucciones del mdico respecto de qu comer o beber despus del procedimiento. Retome sus actividades normales segn lo indicado el mdico. Esta informacin no tiene Marine scientist el consejo del mdico. Asegresede hacerle al mdico cualquier pregunta que tenga. Document Revised: 09/21/2019 Document Reviewed: 09/21/2019 Elsevier Patient Education  2022 [employer].

## 2021-04-09 ENCOUNTER — Encounter (HOSPITAL_COMMUNITY)
Admission: RE | Admit: 2021-04-09 | Discharge: 2021-04-09 | Disposition: A | Discharge status: Home / Self Care | Payer: Self-pay | Source: Ambulatory Visit | Attending: Internal Medicine | Admitting: Internal Medicine

## 2021-04-09 ENCOUNTER — Other Ambulatory Visit: Payer: Self-pay

## 2021-04-09 DIAGNOSIS — Z01818 Encounter for other preprocedural examination: Secondary | ICD-10-CM | POA: Insufficient documentation

## 2021-04-09 DIAGNOSIS — I10 Essential (primary) hypertension: Secondary | ICD-10-CM | POA: Insufficient documentation

## 2021-04-11 ENCOUNTER — Ambulatory Visit (HOSPITAL_COMMUNITY): Payer: Self-pay | Admitting: Anesthesiology

## 2021-04-14 ENCOUNTER — Telehealth: Payer: Self-pay | Admitting: Internal Medicine

## 2021-04-14 ENCOUNTER — Encounter (HOSPITAL_COMMUNITY): Payer: Self-pay

## 2021-04-14 ENCOUNTER — Encounter (HOSPITAL_COMMUNITY)
Admission: RE | Disposition: A | Discharge status: Home / Self Care | Payer: Self-pay | Source: Home / Self Care | Attending: Internal Medicine

## 2021-04-14 ENCOUNTER — Ambulatory Visit (HOSPITAL_COMMUNITY)
Admission: RE | Admit: 2021-04-14 | Discharge: 2021-04-14 | Disposition: A | Discharge status: Home / Self Care | Payer: Self-pay | Attending: Internal Medicine | Admitting: Internal Medicine

## 2021-04-14 DIAGNOSIS — U071 COVID-19: Secondary | ICD-10-CM | POA: Insufficient documentation

## 2021-04-14 DIAGNOSIS — Z538 Procedure and treatment not carried out for other reasons: Secondary | ICD-10-CM | POA: Insufficient documentation

## 2021-04-14 DIAGNOSIS — K259 Gastric ulcer, unspecified as acute or chronic, without hemorrhage or perforation: Secondary | ICD-10-CM | POA: Insufficient documentation

## 2021-04-14 DIAGNOSIS — K219 Gastro-esophageal reflux disease without esophagitis: Secondary | ICD-10-CM | POA: Insufficient documentation

## 2021-04-14 DIAGNOSIS — D649 Anemia, unspecified: Secondary | ICD-10-CM | POA: Insufficient documentation

## 2021-04-14 LAB — CBC
HCT: 45.4 % (ref 39.0–52.0)
Hemoglobin: 14.9 g/dL (ref 13.0–17.0)
MCH: 26.7 pg (ref 26.0–34.0)
MCHC: 32.8 g/dL (ref 30.0–36.0)
MCV: 81.2 fL (ref 80.0–100.0)
Platelets: 268 10*3/uL (ref 150–400)
RBC: 5.59 MIL/uL (ref 4.22–5.81)
RDW: 14.5 % (ref 11.5–15.5)
WBC: 4.6 10*3/uL (ref 4.0–10.5)
nRBC: 0 % (ref 0.0–0.2)

## 2021-04-14 LAB — SARS CORONAVIRUS 2 BY RT PCR (HOSPITAL ORDER, PERFORMED IN ~~LOC~~ HOSPITAL LAB): SARS Coronavirus 2: POSITIVE — AB

## 2021-04-14 SURGERY — ESOPHAGOGASTRODUODENOSCOPY (EGD) WITH PROPOFOL
Anesthesia: Monitor Anesthesia Care

## 2021-04-14 MED ORDER — LACTATED RINGERS IV SOLN: INTRAVENOUS | Status: DC

## 2021-04-14 NOTE — H&P (Signed)
Patient presented today for upper endoscopy.  During preop questioning, complains of cough and sore throat.  He was subsequently COVID tested which was positive.  We will reschedule his procedure.

## 2021-04-14 NOTE — OR Nursing (Signed)
Patient complains of cough ,  procedure on hold until covid test is complete. Patient took to post op 4 to hold. Waiting on covid result.

## 2021-04-14 NOTE — OR Nursing (Signed)
Dr. [name] , Dr. Wyatt Haste , and office aware that patient is covid positive. Procedure cancelled and pt. aware he will be notified of when he is rescheduled.

## 2021-04-14 NOTE — Telephone Encounter (Signed)
Tried to call pt's wife to reschedule EGD, LMOVM for return call.

## 2021-04-14 NOTE — OR Nursing (Signed)
Interpreter used for case.  Interpreter number [id number].  Patient c/o cough & sore throat.  Per Dr Griselda Miner verbal order for covid test.

## 2021-04-14 NOTE — Telephone Encounter (Signed)
Pamala Hurry from Short Stay called to say they cancelled patient procedure with Dr [name] today due to testing positive for covid.

## 2021-04-15 NOTE — Telephone Encounter (Signed)
Spoke to pt's wife, EGD w/Propofol ASA 3 w/Dr. [name] rescheduled to 05/12/21 at 10:30am. Orders entered.

## 2021-04-15 NOTE — Telephone Encounter (Signed)
Pre-op appt 05/07/21. Appt letter mailed with procedure instructions.

## 2021-04-30 NOTE — Patient Instructions (Addendum)
Instrucciones Para Antes de la Ciruga   Su ciruga est programada para-(your procedure is scheduled on)  05/12/2021 @ 0830 AM    [facility] - (enter)    Por favor llame al [phone] si tiene algn problema en la maana de la ciruga. (please call if you have any problems the morning of surgery.)                  Recuerde: (Remember)   No coma alimentos ni tome lquidos, incluyendo agua, despus de la medianoche del  (Do not eat food or drink liquids including water after midnight on 05/11/2021.)   [employer] medicinas en la maana de la ciruga con un SORBITO de agua (take these meds the morning of surgery with a SIP of water)  Protonix.   Puede cepillarse los dientes en la maana de la [place]. (you may brush your teeth the morning of surgery)   No use joyas, maquillaje de ojos, lpiz labial, crema para el cuerpo o esmalte de uas oscuro. (Do not wear jewelry, eye makeup, lipstick, body lotion, or dark fingernail polish)   No puede usar desodorante. (you may wear deodorant)   Si va a ser ingresado despues de la ciruga, deje la maleta en el carro hasta que se le haya asignado una habitacin. (If you are to be admitted after surgery, leave suitcase in car until your room has been assigned.)   A los pacientes que se les d de alta el mismo da no se les permitir manejar a casa.  (Patients discharged on the day of surgery will not be allowed to drive home)   Use ropa suelta y cmoda de regreso a casa. (wear loose comfortable clothes for ride home)    Endoscopa alta en los adultos, cuidados posteriores Upper Endoscopy, Adult, Care After Esta hoja le brinda informacin sobre cmo cuidarse despus del procedimiento. Su mdico tambin podr darle instrucciones ms especficas. Comunquese con elmdico si tiene problemas o preguntas. Qu puedo esperar despus del procedimiento? Despus del procedimiento, es normal tener  los siguientes sntomas: Dolor de Investment banker, operational. Leve dolor o molestias en el estmago. Meteorismo. Nuseas. Siga estas indicaciones en su casa:  Siga las indicaciones del mdico respecto de qu comer o beber despus del procedimiento. Retome sus actividades normales como se lo haya indicado el mdico. Pregntele al mdico qu actividades son seguras para usted. Use los medicamentos de venta libre y los recetados solamente como te lo haya indicado el mdico. Si le administraron un sedante durante el procedimiento, puede afectarlo por varias horas. No conduzca ni opere maquinaria hasta que el mdico le indique que es seguro [place]. Concurra a todas las visitas de seguimiento como se lo haya indicado el mdico. Esto es importante. Comunquese con un mdico si tiene: Dolor de garganta que dura ms de Optician, dispensing. Dificultad para tragar. Solicite ayuda inmediatamente si: Vomita sangre o el vmito tiene un aspecto similar al poso del caf. Tiene los siguientes sntomas: Cristy Hilts. Heces con sangre o de aspecto negro alquitranado. Dolor de garganta muy intenso o no puede tragar. Dificultad para respirar. Dolor intenso en el pecho o el abdomen. Resumen Despus del procedimiento, es frecuente sentir dolor de garganta, molestias leves en el  estmago, distensin abdominal y nuseas. Si le administraron un sedante durante el procedimiento, puede afectarlo por varias horas. No conduzca ni opere maquinaria hasta que el mdico le indique que es seguro [place]. Siga las instrucciones del mdico respecto de qu comer o beber despus del procedimiento. Retome sus actividades normales segn lo indicado el mdico. Esta informacin no tiene Marine scientist el consejo del mdico. Asegresede hacerle al mdico cualquier pregunta que tenga. Document Revised: 09/21/2019 Document Reviewed: 09/21/2019 Elsevier Patient Education  2022 [place], cuidados posteriores Monitored Anesthesia  Care, Care After [employer] brinda informacin sobre cmo cuidarse despus del procedimiento. Su mdico tambin podr darle instrucciones ms especficas. Comunquese con elmdico si tiene problemas o preguntas. Qu puedo esperar despus del procedimiento? Despus del procedimiento, es normal tener los siguientes sntomas: Cansancio. Olvidarse de lo que sucedi despus del procedimiento. Incapacidad para tomar decisiones importantes. Nuseas o vmitos. Cierta dificultad para mantener el equilibrio. Siga estas instrucciones en su casa: Durante el perodo de tiempo que le haya indicado el mdico:     Descansar todo lo que sea necesario. No participe en actividades que impliquen posibles cadas o lesiones. No conduzca ni opere maquinaria. No beba alcohol. No tome somnferos ni medicamentos que causen somnolencia. No tome decisiones trascendentes ni firme documentos importantes. No cuide a nios por su cuenta. Comida y bebida Siga la dieta recomendada por el mdico. Beber suficiente lquido como para mantener la orina de color amarillo plido. Si vomita: [place], jugo o sopa cuando pueda beber sin vomitar. Asegrese de no tener nuseas antes de ingerir alimentos slidos. Instrucciones generales Pida a un adulto responsable que se quede con usted durante el tiempo que se le indique. Es importante tener a alguien que lo ayude a cuidarse hasta que est despierto y Press photographer. Use los medicamentos de venta libre y los recetados solamente como se lo haya indicado el mdico. Si tiene apnea del sueo, la [place] y ciertos medicamentos pueden elevar su riesgo de Best boy problemas respiratorios. Siga las instrucciones del mdico respecto al uso del dispositivo para dormir: Siempre que duerma, [place] siestas que tome en [employer]. Mientras tome analgsicos recetados, medicamentos para dormir o medicamentos que producen somnolencia. No fume. Concurra a todas las visitas de seguimiento  como se lo haya indicado el mdico. Esto es importante. Comunquese con un mdico si: Sigue teniendo nuseas o vomitando. Siente que va a desvanecerse. An est somnoliento y tiene dificultad para mantener el equilibrio despus de 24 horas. Presenta una erupcin cutnea. Tiene fiebre. Tiene enrojecimiento o hinchazn alrededor del lugar de la va intravenosa (i.v.). Solicite ayuda de inmediato si: Tiene dificultad para respirar. Tiene un nuevo episodio de confusin en Engineer, mining. Resumen Tal vez se sienta cansado durante varias horas despus del procedimiento. Tambin puede ser olvidadizo y perder criterio para tomar decisiones. Pida a un adulto responsable que se quede con usted durante el tiempo que se le indique. Es importante tener a alguien que lo ayude a cuidarse hasta que est despierto y Press photographer. Haga reposo segn las indicaciones que le hayan dado. No conduzca vehculos ni opere maquinaria. No beba alcohol ni tome pastillas para dormir. Solicite ayuda de inmediato si tiene dificultad para respirar o si se siente confundido repentinamente. Esta informacin no tiene Marine scientist el consejo del mdico. Asegresede hacerle al mdico cualquier pregunta que tenga. Document Revised: 03/25/2020 Document Reviewed: 03/25/2020 Elsevier Patient Education  [place].

## 2021-05-07 ENCOUNTER — Encounter (HOSPITAL_COMMUNITY)
Admission: RE | Admit: 2021-05-07 | Discharge: 2021-05-07 | Disposition: A | Discharge status: Home / Self Care | Payer: 59 | Source: Ambulatory Visit | Attending: Internal Medicine | Admitting: Internal Medicine

## 2021-05-07 ENCOUNTER — Other Ambulatory Visit: Payer: Self-pay

## 2021-05-07 ENCOUNTER — Encounter (HOSPITAL_COMMUNITY): Payer: Self-pay

## 2021-05-07 DIAGNOSIS — Z01812 Encounter for preprocedural laboratory examination: Secondary | ICD-10-CM | POA: Diagnosis not present

## 2021-05-07 LAB — COMPREHENSIVE METABOLIC PANEL
ALT: 22 U/L (ref 0–44)
AST: 21 U/L (ref 15–41)
Albumin: 4 g/dL (ref 3.5–5.0)
Alkaline Phosphatase: 75 U/L (ref 38–126)
Anion gap: 5 (ref 5–15)
BUN: 14 mg/dL (ref 6–20)
CO2: 28 mmol/L (ref 22–32)
Calcium: 8.8 mg/dL — ABNORMAL LOW (ref 8.9–10.3)
Chloride: 104 mmol/L (ref 98–111)
Creatinine, Ser: 0.84 mg/dL (ref 0.61–1.24)
GFR, Estimated: >60 mL/min (ref >60)
Glucose, Bld: 86 mg/dL (ref 70–99)
Potassium: 3.8 mmol/L (ref 3.5–5.1)
Sodium: 137 mmol/L (ref 135–145)
Total Bilirubin: 0.6 mg/dL (ref 0.3–1.2)
Total Protein: 7.1 g/dL (ref 6.5–8.1)

## 2021-05-12 ENCOUNTER — Ambulatory Visit (HOSPITAL_COMMUNITY)
Admission: RE | Admit: 2021-05-12 | Discharge: 2021-05-12 | Disposition: A | Discharge status: Home / Self Care | Payer: 59 | Attending: Internal Medicine | Admitting: Internal Medicine

## 2021-05-12 ENCOUNTER — Encounter (HOSPITAL_COMMUNITY): Payer: Self-pay

## 2021-05-12 ENCOUNTER — Ambulatory Visit (HOSPITAL_COMMUNITY): Payer: 59 | Admitting: Anesthesiology

## 2021-05-12 ENCOUNTER — Encounter (HOSPITAL_COMMUNITY)
Admission: RE | Disposition: A | Discharge status: Home / Self Care | Payer: Self-pay | Source: Home / Self Care | Attending: Internal Medicine

## 2021-05-12 ENCOUNTER — Other Ambulatory Visit: Payer: Self-pay

## 2021-05-12 DIAGNOSIS — Z8719 Personal history of other diseases of the digestive system: Secondary | ICD-10-CM | POA: Diagnosis not present

## 2021-05-12 DIAGNOSIS — C169 Malignant neoplasm of stomach, unspecified: Secondary | ICD-10-CM | POA: Diagnosis not present

## 2021-05-12 DIAGNOSIS — Z79899 Other long term (current) drug therapy: Secondary | ICD-10-CM | POA: Insufficient documentation

## 2021-05-12 DIAGNOSIS — Z8711 Personal history of peptic ulcer disease: Secondary | ICD-10-CM | POA: Insufficient documentation

## 2021-05-12 DIAGNOSIS — K2970 Gastritis, unspecified, without bleeding: Secondary | ICD-10-CM | POA: Diagnosis not present

## 2021-05-12 DIAGNOSIS — K3189 Other diseases of stomach and duodenum: Secondary | ICD-10-CM | POA: Diagnosis not present

## 2021-05-12 DIAGNOSIS — C163 Malignant neoplasm of pyloric antrum: Secondary | ICD-10-CM | POA: Diagnosis not present

## 2021-05-12 DIAGNOSIS — K279 Peptic ulcer, site unspecified, unspecified as acute or chronic, without hemorrhage or perforation: Secondary | ICD-10-CM

## 2021-05-12 DIAGNOSIS — K219 Gastro-esophageal reflux disease without esophagitis: Secondary | ICD-10-CM | POA: Insufficient documentation

## 2021-05-12 DIAGNOSIS — Z09 Encounter for follow-up examination after completed treatment for conditions other than malignant neoplasm: Secondary | ICD-10-CM | POA: Diagnosis present

## 2021-05-12 DIAGNOSIS — K259 Gastric ulcer, unspecified as acute or chronic, without hemorrhage or perforation: Secondary | ICD-10-CM | POA: Diagnosis not present

## 2021-05-12 HISTORY — PX: ESOPHAGOGASTRODUODENOSCOPY (EGD) WITH PROPOFOL: SHX5813

## 2021-05-12 HISTORY — PX: BIOPSY: SHX5522

## 2021-05-12 SURGERY — ESOPHAGOGASTRODUODENOSCOPY (EGD) WITH PROPOFOL
Anesthesia: General

## 2021-05-12 MED ORDER — PROPOFOL 10 MG/ML IV BOLUS
INTRAVENOUS | Status: DC | PRN
  Administered 2021-05-12: 50 mg via INTRAVENOUS
  Administered 2021-05-12: 100 mg via INTRAVENOUS

## 2021-05-12 MED ORDER — LIDOCAINE HCL (CARDIAC) PF 100 MG/5ML IV SOSY
PREFILLED_SYRINGE | INTRAVENOUS | Status: DC | PRN
  Administered 2021-05-12: 50 mg via INTRAVENOUS

## 2021-05-12 MED ORDER — STERILE WATER FOR IRRIGATION IR SOLN
Status: DC | PRN
  Administered 2021-05-12: 100 mL

## 2021-05-12 MED ORDER — LACTATED RINGERS IV SOLN: INTRAVENOUS | Status: DC

## 2021-05-12 NOTE — H&P (Signed)
Primary Care Physician:  Pcp, No Primary Gastroenterologist:  Dr. [name]  Pre-Procedure History & Physical: HPI:  Steven Martinez is a 30 y.o. male is here for an EGD for history of gastric ulcer and GI bleed. History significant for hospitalization in January 2022 for hematemesis, melena, acute blood loss anemia due to upper GI bleed in the setting of alcohol abuse.  Also with chronic GERD.  EGD at that time with nonbleeding, nonobstructing gastric ulcer with visible vessel s/p bipolar cautery.  Also with positive H. pylori IgG s/p treatment with Prevpac.    EGD 12/03/2020: Nonobstructing, nonbleeding gastric ulcer with clean base s/p biopsy, gastritis biopsied, normal examined duodenum. Pathology with granulation tissue consistent with ulcer, mild chronic gastritis and focal intestinal metaplasia, H. pylori negative.  Recommend repeat EGD in 3 months or so.  Past Medical History:  Diagnosis Date   Acute upper GI bleed 10/13/2020   gastric ulcer with visible vessel   Alcohol abuse 10/13/2020   Quit Jan 2022   GERD (gastroesophageal reflux disease)    Positive H. pylori test 09/2020   H pylori IgG + Jan 2022 s/p treatment with Prevpac. EGD March 2022 with gastric biopsy negative for H. pylori.     Past Surgical History:  Procedure Laterality Date   BIOPSY  12/03/2020   Procedure: BIOPSY;  Surgeon: Eloise Harman, DO;  Location: [facility];  Service: Endoscopy;;   ESOPHAGOGASTRODUODENOSCOPY (EGD) WITH PROPOFOL N/A 10/14/2020    Surgeon: Eloise Harman, DO;  one non-bleeding, non-obstructing gastric ulcer with visible vessel s/p bipolar cautery, normal examined esophagus and duodenum.    ESOPHAGOGASTRODUODENOSCOPY (EGD) WITH PROPOFOL N/A 12/03/2020    Surgeon: Eloise Harman, DO;   nonobstructing, nonbleeding gastric ulcer with clean base s/p biopsied, gastritis biopsied, normal examined duodenum.  Pathology with granulation tissue consistent with ulcer, mild chronic gastritis with  focal intestinal metaplasia, H. pylori negative.  Repeat in 3 months.    Prior to Admission medications   Medication Sig Start Date End Date Taking? Authorizing Provider  Multiple Vitamin (MULTIVITAMIN WITH MINERALS) TABS tablet Take 1 tablet by mouth in the morning.   Yes [provider]  pantoprazole (PROTONIX) 40 MG tablet Take 1 tablet (40 mg total) by mouth 2 (two) times daily before a meal. 12/03/20 06/01/21 Yes Eloise Harman, DO    Allergies as of 04/15/2021   (No Known Allergies)    Family History  Problem Relation Age of Onset   Diabetes Mellitus II Mother    Hypertension Mother    Colon cancer Neg Hx    Colon polyps Neg Hx     Social History   Socioeconomic History   Marital status: Married    Spouse name: Not on file   Number of children: Not on file   Years of education: Not on file   Highest education level: Not on file  Occupational History   Not on file  Tobacco Use   Smoking status: Never   Smokeless tobacco: Never  Vaping Use   Vaping Use: Never used  Substance and Sexual Activity   Alcohol use: Not Currently    Alcohol/week: 57.0 - 58.0 standard drinks    Types: 54 Cans of beer, 3 - 4 Shots of liquor per week    Comment: Quit January 2022. Used to drink sixpack daily and 12-15 beers on Saturdays and Sundays.    Drug use: Never   Sexual activity: Yes  Other Topics Concern   Not on file  Social History Narrative   Not on file   Social Determinants of Health   Financial Resource Strain: Not on file  Food Insecurity: Not on file  Transportation Needs: Not on file  Physical Activity: Not on file  Stress: Not on file  Social Connections: Not on file  Intimate Partner Violence: Not on file    Review of Systems: See HPI, otherwise negative ROS  Physical Exam: Vital signs in last 24 hours: Temp:  [98.5 F (36.9 C)] 98.5 F (36.9 C) (08/22 0856) Pulse Rate:  [73-85] 73 (08/22 0908) Resp:  [12-19] 12 (08/22 0908) BP:  (134-138)/(99-101) 134/99 (08/22 0908) SpO2:  [97 %-99 %] 99 % (08/22 0908) Weight:  [68.9 kg] 68.9 kg (08/22 0856)   General:   Alert,  Well-developed, well-nourished, pleasant and cooperative in NAD Head:  Normocephalic and atraumatic. Eyes:  Sclera clear, no icterus.   Conjunctiva pink. Ears:  Normal auditory acuity. Nose:  No deformity, discharge,  or lesions. Mouth:  No deformity or lesions, dentition normal. Neck:  Supple; no masses or thyromegaly. Lungs:  Clear throughout to auscultation.   No wheezes, crackles, or rhonchi. No acute distress. Heart:  Regular rate and rhythm; no murmurs, clicks, rubs,  or gallops. Abdomen:  Soft, nontender and nondistended. No masses, hepatosplenomegaly or hernias noted. Normal bowel sounds, without guarding, and without rebound.   Msk:  Symmetrical without gross deformities. Normal posture. Extremities:  Without clubbing or edema. Neurologic:  Alert and  oriented x4;  grossly normal neurologically. Skin:  Intact without significant lesions or rashes. Cervical Nodes:  No significant cervical adenopathy. Psych:  Alert and cooperative. Normal mood and affect.  Impression/Plan: Steven Martinez is herefor an EGD for history of gastric ulcer and GI bleed.   The risks of the procedure including infection, bleed, or perforation as well as benefits, limitations, alternatives and imponderables have been reviewed with the patient. Questions have been answered. All parties agreeable.

## 2021-05-12 NOTE — Op Note (Signed)
[facility] Patient Name: Steven Martinez Procedure Date: 05/12/2021 10:12 AM MRN: [id number] Date of Birth: 25-Jan-1991 Attending MD: Elon Alas. [name] DO CSN: [id number] Age: 30 Admit Type: Outpatient Procedure:                Upper GI endoscopy Indications:              Follow-up of peptic ulcer Providers:                Elon Alas. [name], DO, Janeece Riggers, RN, Caprice Kluver,                            Nelma Rothman, Technician Referring MD:              Medicines:                See the Anesthesia note for documentation of the                            administered medications Complications:            No immediate complications. Estimated Blood Loss:     Estimated blood loss was minimal. Procedure:                Pre-Anesthesia Assessment:                           - The anesthesia plan was to use monitored                            anesthesia care (MAC).                           After obtaining informed consent, the endoscope was                            passed under direct vision. Throughout the                            procedure, the patient's blood pressure, pulse, and                            oxygen saturations were monitored continuously. The                            [id number]-H190 ([id number]) scope was introduced through the                            mouth, and advanced to the second part of duodenum.                            The upper GI endoscopy was accomplished without                            difficulty. The patient tolerated the procedure                            well.  Scope In: 10:33:35 AM Scope Out: 10:36:43 AM Total Procedure Duration: 0 hours 3 minutes 8 seconds  Findings:      There is no endoscopic evidence of Barrett's esophagus, bleeding,       esophagitis or hiatal hernia in the entire esophagus.      Patchy mild inflammation characterized by erythema was found in the       entire examined stomach.      Mucosal changes were found in the  gastric antrum. Previously noted large       cratered ulcer again noted today. Appears larger and more masslike on       today's exam. Numerous biopsies were again taken.      The duodenal bulb, first portion of the duodenum and second portion of       the duodenum were normal. Impression:               - Gastritis.                           - Mucosal changes in the antrum. Biopsied.                           - Normal duodenal bulb, first portion of the                            duodenum and second portion of the duodenum. Moderate Sedation:      Per Anesthesia Care Recommendation:           - Patient has a contact number available for                            emergencies. The signs and symptoms of potential                            delayed complications were discussed with the                            patient. Return to normal activities tomorrow.                            Written discharge instructions were provided to the                            patient.                           - Resume previous diet.                           - Continue present medications.                           - Await pathology results.                           - Use a proton pump inhibitor PO BID.                           -  Previously noted large cratered ulcer again noted                            today. Appears larger and more masslike on today's                            exam. Numerous biopsies were again taken.                           - I think at this point, despite his negative                            biopsies, we may need to consider surgical                            resection and/or EUS for further evaluation. He has                            been on twice daily PPI without improvement, has                            also been treated for H. pylori with negative                            eradication testing, and the lesion is larger today. Procedure Code(s):        ---  Professional ---                           [phone], Esophagogastroduodenoscopy, flexible,                            transoral; with biopsy, single or multiple Diagnosis Code(s):        --- Professional ---                           K29.70, Gastritis, unspecified, without bleeding                           K31.89, Other diseases of stomach and duodenum                           K27.9, Peptic ulcer, site unspecified, unspecified                            as acute or chronic, without hemorrhage or                            perforation CPT copyright 2019 American [employer] Association. All rights reserved. The codes documented in this report are preliminary and upon coder review may  be revised to meet current compliance requirements. Elon Alas. [name], DO [place] [name], DO 05/12/2021 10:42:56 AM This report has been signed electronically. Number of Addenda: 0

## 2021-05-12 NOTE — Anesthesia Postprocedure Evaluation (Signed)
Anesthesia Post Note  Patient: Steven Martinez  Procedure(s) Performed: ESOPHAGOGASTRODUODENOSCOPY (EGD) WITH PROPOFOL BIOPSY  Patient location during evaluation: Phase II Anesthesia Type: General Level of consciousness: awake and alert and oriented Pain management: pain level controlled Vital Signs Assessment: post-procedure vital signs reviewed and stable Respiratory status: spontaneous breathing and respiratory function stable Cardiovascular status: blood pressure returned to baseline and stable Postop Assessment: no apparent nausea or vomiting Anesthetic complications: no   No notable events documented.   Last Vitals:  Vitals:   05/12/21 0908 05/12/21 1043  BP: (!) 134/99 100/64  Pulse: 73 72  Resp: 12 18  Temp:  36.7 C  SpO2: 99% 95%    Last Pain:  Vitals:   05/12/21 1043  TempSrc: Oral  PainSc: 0-No pain                 Chayce Robbins C Matson Welch

## 2021-05-12 NOTE — Transfer of Care (Signed)
Immediate Anesthesia Transfer of Care Note  Patient: Steven Martinez  Procedure(s) Performed: ESOPHAGOGASTRODUODENOSCOPY (EGD) WITH PROPOFOL BIOPSY  Patient Location: PACU and Short Stay  Anesthesia Type:General  Level of Consciousness: awake  Airway & Oxygen Therapy: Patient Spontanous Breathing  Post-op Assessment: Report given to RN and Post -op Vital signs reviewed and stable  Post vital signs: Reviewed and stable  Last Vitals:  Vitals Value Taken Time  BP    Temp    Pulse    Resp    SpO2      Last Pain:  Vitals:   05/12/21 1030  TempSrc:   PainSc: 0-No pain      Patients Stated Pain Goal: 5 ([id number])  Complications: No notable events documented.

## 2021-05-12 NOTE — Discharge Instructions (Addendum)
EGD Discharge instructions Please read the instructions outlined below and refer to this sheet in the next few weeks. These discharge instructions provide you with general information on caring for yourself after you leave the hospital. Your doctor may also give you specific instructions. While your treatment has been planned according to the most current medical practices available, unavoidable complications occasionally occur. If you have any problems or questions after discharge, please call your doctor. ACTIVITY You may resume your regular activity but move at a slower pace for the next 24 hours.  Take frequent rest periods for the next 24 hours.  Walking will help expel (get rid of) the air and reduce the bloated feeling in your abdomen.  No driving for 24 hours (because of the anesthesia (medicine) used during the test).  You may shower.  Do not sign any important legal documents or operate any machinery for 24 hours (because of the anesthesia used during the test).  NUTRITION Drink plenty of fluids.  You may resume your normal diet.  Begin with a light meal and progress to your normal diet.  Avoid alcoholic beverages for 24 hours or as instructed by your caregiver.  MEDICATIONS You may resume your normal medications unless your caregiver tells you otherwise.  WHAT YOU CAN EXPECT TODAY You may experience abdominal discomfort such as a feeling of fullness or "gas" pains.  FOLLOW-UP Your doctor will discuss the results of your test with you.  SEEK IMMEDIATE MEDICAL ATTENTION IF ANY OF THE FOLLOWING OCCUR: Excessive nausea (feeling sick to your stomach) and/or vomiting.  Severe abdominal pain and distention (swelling).  Trouble swallowing.  Temperature over 101 F (37.8 C).  Rectal bleeding or vomiting of blood.    The large ulcer is still present.  It actually looks slightly bigger today and slightly more masslike.  At this point we have tried aggressive medication management with  twice daily PPI as well as treating H. pylori infection without improvement.  I think we need to discuss further evaluation with endoscopic ultrasound and/or surgical resection.  I will discuss further with advanced GI and/or surgery to decide next steps.  I will contact you when we get your biopsy results back.  I hope you have a great rest of your week!  Elon Alas. [name], D.O. Gastroenterology and Hepatology [facility]

## 2021-05-12 NOTE — Anesthesia Preprocedure Evaluation (Signed)
Anesthesia Evaluation  Patient identified by MRN, date of birth, ID band Patient awake    Reviewed: Allergy & Precautions, NPO status , Patient's Chart, lab work & pertinent test results  Airway Mallampati: II  TM Distance: >3 FB Neck ROM: Full    Dental  (+) Dental Advisory Given, Teeth Intact   Pulmonary neg pulmonary ROS,    Pulmonary exam normal breath sounds clear to auscultation       Cardiovascular Exercise Tolerance: Good Normal cardiovascular exam Rhythm:Regular Rate:Normal     Neuro/Psych PSYCHIATRIC DISORDERS negative neurological ROS     GI/Hepatic PUD, GERD  Medicated and Controlled,(+)     substance abuse (alcohol abuse)  alcohol use,   Endo/Other  negative endocrine ROS  Renal/GU negative Renal ROS     Musculoskeletal negative musculoskeletal ROS (+)   Abdominal   Peds  Hematology  (+) anemia ,   Anesthesia Other Findings   Reproductive/Obstetrics negative OB ROS                             Anesthesia Physical  Anesthesia Plan  ASA: 2  Anesthesia Plan: General   Post-op Pain Management:    Induction: Intravenous  PONV Risk Score and Plan: TIVA and Propofol infusion  Airway Management Planned: Nasal Cannula and Natural Airway  Additional Equipment:   Intra-op Plan:   Post-operative Plan:   Informed Consent: I have reviewed the patients History and Physical, chart, labs and discussed the procedure including the risks, benefits and alternatives for the proposed anesthesia with the patient or authorized representative who has indicated his/her understanding and acceptance.     Dental advisory given  Plan Discussed with: Surgeon  Anesthesia Plan Comments:         Anesthesia Quick Evaluation

## 2021-05-13 ENCOUNTER — Telehealth: Payer: Self-pay | Admitting: Internal Medicine

## 2021-05-13 DIAGNOSIS — C169 Malignant neoplasm of stomach, unspecified: Secondary | ICD-10-CM

## 2021-05-13 LAB — SURGICAL PATHOLOGY

## 2021-05-13 NOTE — Telephone Encounter (Signed)
Was paged by pathology today.  Unfortunately, his biopsies this time showed evidence of adenocarcinoma.  I called and discussed in depth with patient's wife today.  I also called Dr. Arnoldo Morale and discussed case with him.  Can we order CT chest, abdomen, pelvis with contrast for staging as soon as possible?  Can we also refer him to Dr. Arnoldo Morale and Dr. Raliegh Ip of oncology?  Given language barrier with patient, I think the point of contact in regards to scheduling all of the above should be with his wife Steven Martinez [phone])  Thank you.

## 2021-05-13 NOTE — Addendum Note (Signed)
Addended by: Hassan Rowan on: 05/13/2021 03:52 PM   Modules accepted: Orders

## 2021-05-13 NOTE — Telephone Encounter (Signed)
CT chest/abd/pelvis w/contrast scheduled for 05/14/21 at 5:00pm, arrive at 4:45pm. NPO 4 hours before test. Pickup contrast today.  Referral sent to Dr. Arnoldo Morale and Dr. Raliegh Ip at cancer center via [place].  Called and informed pt's wife of CT appt and referrals being sent.

## 2021-05-14 ENCOUNTER — Ambulatory Visit (HOSPITAL_COMMUNITY)
Admission: RE | Admit: 2021-05-14 | Discharge: 2021-05-14 | Disposition: A | Discharge status: Home / Self Care | Payer: 59 | Source: Ambulatory Visit | Attending: Internal Medicine | Admitting: Internal Medicine

## 2021-05-14 ENCOUNTER — Other Ambulatory Visit: Payer: Self-pay

## 2021-05-14 DIAGNOSIS — C169 Malignant neoplasm of stomach, unspecified: Secondary | ICD-10-CM | POA: Insufficient documentation

## 2021-05-14 DIAGNOSIS — K3189 Other diseases of stomach and duodenum: Secondary | ICD-10-CM | POA: Diagnosis not present

## 2021-05-14 DIAGNOSIS — R911 Solitary pulmonary nodule: Secondary | ICD-10-CM | POA: Diagnosis not present

## 2021-05-14 DIAGNOSIS — R599 Enlarged lymph nodes, unspecified: Secondary | ICD-10-CM | POA: Diagnosis not present

## 2021-05-14 MED ORDER — IOHEXOL 350 MG/ML SOLN
100.0000 mL | Freq: Once | INTRAVENOUS | Status: AC | PRN
  Administered 2021-05-14: 80 mL via INTRAVENOUS

## 2021-05-19 NOTE — Progress Notes (Signed)
[place] [address] [place] [address]   CLINIC:  Medical Oncology/Hematology  CONSULT NOTE  Patient Care Team: Pcp, No as PCP - General Eloise Harman, DO as Consulting Physician (Gastroenterology) Brien Mates, RN as Oncology Nurse Navigator (Oncology) Derek Jack, MD as Medical Oncologist (Oncology)  CHIEF COMPLAINTS/PURPOSE OF CONSULTATION:  Evaluation of adenocarcinoma of the stomach  HISTORY OF PRESENTING ILLNESS:  [name] 30 y.o. male is here because of evaluation of adenocarcinoma of the stomach, at the request of [facility].  Today he reports feeling good, and he is accompanied by his wife who is acting as an Astronomer. He denies n/v and black stools. He reports pain in his abdominal left upper quadrant when he has not eaten, so he has to eat every 2 hours. The pain is exacerbated by greasy food. He has lost 13 lbs in 3 months, and he reports good appetite. He has recently cut greasy, spicy, and acidic foods out of his diet due to concern of irritating his stomach ulcer. He denies previous history of cancer.   He quit smoking 13 years ago, and he quit drinking alcohol since March 2022. 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 He denies any family history of cancer.   MEDICAL HISTORY:  Past Medical History:  Diagnosis Date   Acute upper GI bleed 10/13/2020   gastric ulcer with visible vessel   Alcohol abuse 10/13/2020   Quit Jan 2022   GERD (gastroesophageal reflux disease)    Positive H. pylori test 09/2020   H pylori IgG + Jan 2022 s/p treatment with Prevpac. EGD March 2022 with gastric biopsy negative for H. pylori.     SURGICAL HISTORY: Past Surgical History:  Procedure Laterality Date   BIOPSY  12/03/2020   Procedure: BIOPSY;  Surgeon: Eloise Harman, DO;  [facility];  Service: Endoscopy;;   ESOPHAGOGASTRODUODENOSCOPY (EGD) WITH PROPOFOL N/A 10/14/2020    Surgeon: Eloise Harman, DO;  one  non-bleeding, non-obstructing gastric ulcer with visible vessel s/p bipolar cautery, normal examined esophagus and duodenum.    ESOPHAGOGASTRODUODENOSCOPY (EGD) WITH PROPOFOL N/A 12/03/2020    Surgeon: Eloise Harman, DO;   nonobstructing, nonbleeding gastric ulcer with clean base s/p biopsied, gastritis biopsied, normal examined duodenum.  Pathology with granulation tissue consistent with ulcer, mild chronic gastritis with focal intestinal metaplasia, H. pylori negative.  Repeat in 3 months.    SOCIAL HISTORY: Social History   Socioeconomic History   Marital status: Married    Spouse name: Not on file   Number of children: Not on file   Years of education: Not on file   Highest education level: Not on file  Occupational History   Not on file  Tobacco Use   Smoking status: Never   Smokeless tobacco: Never  Vaping Use   Vaping Use: Never used  Substance and Sexual Activity   Alcohol use: Not Currently    Alcohol/week: 57.0 - 58.0 standard drinks    Types: 54 Cans of beer, 3 - 4 Shots of liquor per week    Comment: Quit January 2022. Used to drink sixpack daily and 12-15 beers on Saturdays and Sundays.    Drug use: Never   Sexual activity: Yes  Other Topics Concern   Not on file  Social History Narrative   Not on file   Social Determinants of Health   Financial Resource Strain: Low Risk    Difficulty of Paying Living Expenses: Not hard at  all  Food Insecurity: No Food Insecurity   Worried About Charity fundraiser in the Last Year: Never true   Ran Out of Food in the Last Year: Never true  Transportation Needs: No Transportation Needs   Lack of Transportation (Medical): No   Lack of Transportation (Non-Medical): No  Physical Activity: Sufficiently Active   Days of Exercise per Week: 5 days   Minutes of Exercise per Session: 60 min  Stress: No Stress Concern Present   Feeling of Stress : Not at all  Social Connections: Moderately Isolated   Frequency of Communication  with Friends and Family: More than three times a week   Frequency of Social Gatherings with Friends and Family: More than three times a week   Attends Religious Services: Never   Marine scientist or Organizations: No   Attends Music therapist: Never   Marital Status: Married  Human resources officer Violence: Not At Risk   Fear of Current or Ex-Partner: No   Emotionally Abused: No   Physically Abused: No   Sexually Abused: No    FAMILY HISTORY: Family History  Problem Relation Age of Onset   Diabetes Mellitus II Mother    Hypertension Mother    Colon cancer Neg Hx    Colon polyps Neg Hx     ALLERGIES:  has No Known Allergies.  MEDICATIONS:  Current Outpatient Medications  Medication Sig Dispense Refill   Multiple Vitamin (MULTIVITAMIN WITH MINERALS) TABS tablet Take 1 tablet by mouth in the morning.     pantoprazole (PROTONIX) 40 MG tablet Take 1 tablet (40 mg total) by mouth 2 (two) times daily before a meal. 60 tablet 5   sucralfate (CARAFATE) 1 GM/10ML suspension Take 10 mLs (1 g total) by mouth 4 (four) times daily -  with meals and at bedtime. 420 mL 0   No current facility-administered medications for this visit.    REVIEW OF SYSTEMS:   Review of Systems  Constitutional:  Negative for appetite change and fatigue.  Gastrointestinal:  Positive for abdominal pain (3/10  mid to lower).  All other systems reviewed and are negative.   PHYSICAL EXAMINATION: ECOG PERFORMANCE STATUS: 0 - Asymptomatic  Vitals:   05/20/21 0944  BP: 139/89  Pulse: 89  Resp: 16  Temp: 97.6 F (36.4 C)  SpO2: 99%   Filed Weights   05/20/21 0944  Weight: 156 lb 4.9 oz (70.9 kg)   Physical Exam Vitals reviewed.  Constitutional:      Appearance: Normal appearance.  Cardiovascular:     Rate and Rhythm: Normal rate and regular rhythm.     Pulses: Normal pulses.     Heart sounds: Normal heart sounds.  Pulmonary:     Effort: Pulmonary effort is normal.     Breath  sounds: Normal breath sounds.  Abdominal:     Palpations: Abdomen is soft. There is no hepatomegaly, splenomegaly or mass.     Tenderness: There is no abdominal tenderness.  Musculoskeletal:     Right lower leg: No edema.     Left lower leg: No edema.  Neurological:     General: No focal deficit present.     Mental Status: He is alert and oriented to person, place, and time.  Psychiatric:        Mood and Affect: Mood normal.        Behavior: Behavior normal.     LABORATORY DATA:  I have reviewed the data as listed CBC Latest Ref Rng &  Units 04/14/2021 02/03/2021 12/02/2020  WBC 4.0 - 10.5 K/uL 4.6 4.0 3.8(L)  Hemoglobin 13.0 - 17.0 g/dL 14.9 13.8 10.3(L)  Hematocrit 39.0 - 52.0 % 45.4 43.0 33.9(L)  Platelets 150 - 400 K/uL 268 210 311   CMP Latest Ref Rng & Units 05/07/2021 12/02/2020 10/14/2020  Glucose 70 - 99 mg/dL 86 111(H) 115(H)  BUN 6 - 20 mg/dL '14 10 18  '$ Creatinine 0.61 - 1.24 mg/dL 0.84 0.83 0.86  Sodium 135 - 145 mmol/L 137 139 138  Potassium 3.5 - 5.1 mmol/L 3.8 3.7 4.4  Chloride 98 - 111 mmol/L 104 106 109  CO2 22 - 32 mmol/L '28 24 26  '$ Calcium 8.9 - 10.3 mg/dL 8.8(L) 8.9 8.2(L)  Total Protein 6.5 - 8.1 g/dL 7.1 7.2 6.0(L)  Total Bilirubin 0.3 - 1.2 mg/dL 0.6 0.4 0.5  Alkaline Phos 38 - 126 U/L 75 62 56  AST 15 - 41 U/L '21 15 16  '$ ALT 0 - 44 U/L '22 12 19    '$ RADIOGRAPHIC STUDIES: I have personally reviewed the radiological images as listed and agreed with the findings in the report. CT CHEST ABDOMEN PELVIS W CONTRAST  Result Date: 05/16/2021 CLINICAL DATA:  Gastric adenocarcinoma.  New diagnosis. EXAM: CT CHEST, ABDOMEN, AND PELVIS WITH CONTRAST TECHNIQUE: Multidetector CT imaging of the chest, abdomen and pelvis was performed following the standard protocol during bolus administration of intravenous contrast. CONTRAST:  38m OMNIPAQUE IOHEXOL 350 MG/ML SOLN COMPARISON:  01/23/2020 chest radiograph. 05/12/2021 upper endoscopy also reviewed. FINDINGS: CT CHEST FINDINGS  Cardiovascular: Normal aortic caliber. Normal heart size, without pericardial effusion. No central pulmonary embolism, on this non-dedicated study. Mediastinum/Nodes: No supraclavicular adenopathy. No mediastinal or hilar adenopathy. Lungs/Pleura: No pleural fluid. Isolated right middle lobe pulmonary nodule of 4 mm on 89/3. Musculoskeletal: No acute osseous abnormality. CT ABDOMEN PELVIS FINDINGS Hepatobiliary: Nonspecific caudate lobe enlargement. No focal liver lesion. Normal gallbladder, without biliary ductal dilatation. Pancreas: Normal, without mass or ductal dilatation. Spleen: Normal in size, without focal abnormality. Adrenals/Urinary Tract: Normal adrenal glands. Normal kidneys, without hydronephrosis. Normal urinary bladder. Stomach/Bowel: Gastric antral soft tissue thickening/mass, including at 3.7 x 3.0 cm on 55/2. No gastric outlet obstruction. No perigastric extension. Colonic stool burden suggests constipation. Normal terminal ileum and appendix. Normal small bowel. Vascular/Lymphatic: Normal caliber of the aorta and branch vessels. 1.2 cm portacaval node on 59/2 is upper normal sized. A node within the perigastric fat anterior to the pylorus measures 5 mm on 57/2. No pelvic sidewall adenopathy. Reproductive: Normal prostate. Other: No significant free fluid. No evidence of omental or peritoneal disease. Musculoskeletal: No acute osseous abnormality. IMPRESSION: 1. Gastric antral mass, likely corresponding to the endoscopic finding. Upper normal sized portacaval node and prominent anterior perigastric node are technically indeterminate. Especially the perigastric node is suspicious, based on location. 2. No evidence of distant metastatic disease within the abdomen or pelvis. 3. Isolated 4 mm right middle lobe pulmonary nodule is most likely incidental/benign. Isolated pulmonary metastasis felt less likely. Consider chest CT follow-up at 6 months. 4.  Possible constipation. Electronically Signed    By: KAbigail MiyamotoM.D.   On: 05/16/2021 11:25    ASSESSMENT:  1.  Gastric antral adenocarcinoma: - EGD on 05/12/2021 gastric antral mass with large cratered ulcer.  Normal duodenal bulb and first part of the duodenum. - Biopsy of the antrum ulcer consistent with adenocarcinoma, at least intramucosal.  Depth of invasion cannot be judged as the biopsy was superficial. - CT CAP on 05/14/2021 with gastric  antral soft tissue thickening/mass measuring 3.7 x 3 cm.  1.2 cm portacaval lymph node.  Node within the perigastric fat anterior to the pylorus measures 5 mm.  No evidence of distant metastatic disease in the abdomen or pelvis.  Isolated 4 mm right middle lobe lung nodule most likely incidental/benign. - 13 pound weight loss in the last 3 months.  2.  Social/family history: - He is married and seen with his wife today. - He does Architect work. - He quit smoking cigarettes 13 years ago.  Quit drinking alcohol in March 2022. - No family history of malignancies.   PLAN:  1.  Gastric antral adenocarcinoma: - We reviewed endoscopy, CT scans and biopsy reports in detail. - Recommend PET CT scan to evaluate for peritoneal involvement. - Recommend EGD/EUS assessment of the tumor and possible biopsy. - We will send NGS testing on additional tumor obtained from biopsy. - RTC after the PET scan to discuss treatment plan.  Depending on the involvement on the PET scan, will consider perioperative chemotherapy followed by surgery.  2.  Weight loss: - He had 13 pound weight loss in the last 3 months. - He reports that he has left upper quadrant pain around 2 AM in the morning.  The pain subsides after eating.  He had to eat frequently due to pain.  Denies any nausea or vomiting. - Referral for dietary evaluation.   All questions were answered. The patient knows to call the clinic with any problems, questions or concerns.   Derek Jack, MD, 05/20/21 6:07 PM  [facility] [phone]   I, Thana Ates, am acting as a scribe for Dr. Derek Jack.  I, Derek Jack MD, have reviewed the above documentation for accuracy and completeness, and I agree with the above.

## 2021-05-20 ENCOUNTER — Other Ambulatory Visit: Payer: Self-pay

## 2021-05-20 ENCOUNTER — Encounter (HOSPITAL_COMMUNITY): Payer: Self-pay | Admitting: Hematology

## 2021-05-20 ENCOUNTER — Other Ambulatory Visit (HOSPITAL_COMMUNITY): Payer: Self-pay

## 2021-05-20 ENCOUNTER — Inpatient Hospital Stay (HOSPITAL_COMMUNITY): Payer: 59 | Attending: Hematology | Admitting: Hematology

## 2021-05-20 VITALS — BP 139/89 | HR 89 | Temp 97.6°F | Resp 16 | Ht 67.0 in | Wt 156.3 lb

## 2021-05-20 DIAGNOSIS — R911 Solitary pulmonary nodule: Secondary | ICD-10-CM | POA: Diagnosis not present

## 2021-05-20 DIAGNOSIS — Z8719 Personal history of other diseases of the digestive system: Secondary | ICD-10-CM | POA: Diagnosis not present

## 2021-05-20 DIAGNOSIS — R634 Abnormal weight loss: Secondary | ICD-10-CM | POA: Diagnosis not present

## 2021-05-20 DIAGNOSIS — Z87891 Personal history of nicotine dependence: Secondary | ICD-10-CM | POA: Diagnosis not present

## 2021-05-20 DIAGNOSIS — F1011 Alcohol abuse, in remission: Secondary | ICD-10-CM | POA: Diagnosis not present

## 2021-05-20 DIAGNOSIS — C169 Malignant neoplasm of stomach, unspecified: Secondary | ICD-10-CM | POA: Diagnosis not present

## 2021-05-20 DIAGNOSIS — K259 Gastric ulcer, unspecified as acute or chronic, without hemorrhage or perforation: Secondary | ICD-10-CM | POA: Insufficient documentation

## 2021-05-20 DIAGNOSIS — Z79899 Other long term (current) drug therapy: Secondary | ICD-10-CM | POA: Insufficient documentation

## 2021-05-20 MED ORDER — SUCRALFATE 1 GM/10ML PO SUSP: 1.0000 g | Freq: Three times a day (TID) | ORAL | 0 refills | Status: DC

## 2021-05-20 NOTE — Patient Instructions (Signed)
[facility] Discharge Instructions  You were seen and examined today by Dr. Delton Coombes. Dr. Delton Coombes is a medical oncologist, meaning he specializes in cancer diagnoses. Dr. Delton Coombes discussed your past medical history, family history of cancer and the events that led to you being here today.  Your recent EGD revealed adenocarcinoma that appears to have started in your stomach. Dr. Delton Coombes has recommended a PET scan. A PET scan is a specialized CT scan that illuminates where there is cancer present in your body. This will help identify the stage of the cancer. Dr. Delton Coombes has also recommended meeting with a nutritionist.  Dr. Delton Coombes has also recommended an additional biopsy that will allow [place] to do specialized testing on your cancer and identify any targetable, treatable mutations that would present in your cancer. The original biopsy was a limited biopsy. Dr. Delton Coombes will refer you to a GI specialist for additional biopsy.  Follow-up as scheduled.   Thank you for choosing [facility] to provide your oncology and hematology care.  To afford each patient quality time with our provider, please arrive at least 15 minutes before your scheduled appointment time.   If you have a lab appointment with the [facility] please come in thru the Main Entrance and check in at the main information desk.  You need to re-schedule your appointment should you arrive 10 or more minutes late.  We strive to give you quality time with our providers, and arriving late affects you and other patients whose appointments are after yours.  Also, if you no show three or more times for appointments you may be dismissed from the clinic at the providers discretion.     Again, thank you for choosing [facility].  Our hope is that these requests will decrease the amount of time that you wait before being seen by our physicians.        _____________________________________________________________  Should you have questions after your visit to [facility], please contact our office at [phone] and follow the prompts.  Our office hours are 8:00 a.m. and 4:30 p.m. Monday - Friday.  Please note that voicemails left after 4:00 p.m. may not be returned until the following business day.  We are closed weekends and major holidays.  You do have access to a nurse 24-7, just call the main number to the clinic [phone] and do not press any options, hold on the line and a nurse will answer the phone.    For prescription refill requests, have your pharmacy contact our office and allow 72 hours.    Due to Covid, you will need to wear a mask upon entering the hospital. If you do not have a mask, a mask will be given to you at the Main Entrance upon arrival. For doctor visits, patients may have 1 support person age 45 or older with them. For treatment visits, patients can not have anyone with them due to social distancing guidelines and our immunocompromised population.

## 2021-05-21 ENCOUNTER — Encounter (HOSPITAL_COMMUNITY): Payer: Self-pay | Admitting: Internal Medicine

## 2021-05-29 ENCOUNTER — Encounter: Payer: Self-pay | Admitting: General Surgery

## 2021-05-29 ENCOUNTER — Inpatient Hospital Stay (HOSPITAL_COMMUNITY): Payer: Self-pay | Attending: Hematology | Admitting: Dietician

## 2021-05-29 ENCOUNTER — Encounter (HOSPITAL_COMMUNITY)
Admission: RE | Admit: 2021-05-29 | Discharge: 2021-05-29 | Disposition: A | Discharge status: Home / Self Care | Payer: 59 | Source: Ambulatory Visit | Attending: Hematology | Admitting: Hematology

## 2021-05-29 ENCOUNTER — Other Ambulatory Visit: Payer: Self-pay

## 2021-05-29 ENCOUNTER — Ambulatory Visit (INDEPENDENT_AMBULATORY_CARE_PROVIDER_SITE_OTHER): Payer: 59 | Admitting: General Surgery

## 2021-05-29 VITALS — BP 136/87 | HR 81 | Temp 98.6°F | Resp 14 | Ht 67.0 in | Wt 159.0 lb

## 2021-05-29 DIAGNOSIS — Z79899 Other long term (current) drug therapy: Secondary | ICD-10-CM | POA: Insufficient documentation

## 2021-05-29 DIAGNOSIS — C169 Malignant neoplasm of stomach, unspecified: Secondary | ICD-10-CM

## 2021-05-29 DIAGNOSIS — Z87891 Personal history of nicotine dependence: Secondary | ICD-10-CM | POA: Insufficient documentation

## 2021-05-29 MED ORDER — FLUDEOXYGLUCOSE F - 18 (FDG) INJECTION
7.5400 | Freq: Once | INTRAVENOUS | Status: AC | PRN
  Administered 2021-05-29: 7.54 via INTRAVENOUS

## 2021-05-29 NOTE — Progress Notes (Signed)
Nutrition Assessment   Reason for Assessment: Provider request   ASSESSMENT: 30 year old male with newly diagnosed adenocarcinoma of stomach. Treatment plan pending PET scan results.   Past medical history of acute GIB (gastric ulcer with visible ulcer), EtOH abuse, GERD, positive H pylori test s/p treatment with Prevpac  Met with patient and his wife in clinic today. Patient reports overall doing well, reports feeling anxious about treatment plan. Patient reports meeting with surgeon this morning. He is going for PET scan this afternoon, treatment plan is pending. Patient reports improvement to abdominal pain, he has made changes to his diet and feels this has helped. Patient says sometimes he will wake up ~3 AM with hunger pains. He feels better after drinking water. Patient reports eating carrots and celery before going to bed.  Patient is no longer eating greasy/fried foods and is avoiding spicy and acidic foods. Patient is eating 2-3 meals daily plus snacks in between (tuna salad, eggs, chicken, black beans, pintos, cantaloupe, grapes, peaches, papaya, celery, carrots, cookies). Patient reports drinking 7 bottles of water on work days, 4 bottles when at home. He no longer drinks alcohol, wife attributes weight loss to this and eating healthy. Wife reports patient would drink heavily on the weekends and have a few on weekdays, mostly beer and some tequila that patient sister sent to him from [place].   Nutrition Focused Physical Exam: deferred  Medications: carafate, MVI, protonix   Labs: 8/17 labs reviewed   Anthropometrics: Weight 159 lb today increased from 156 lb on 8/30  Height: 5'7" Weight: 72.1 kg UBW: 177 lb (10/14/20) BMI: 24.48   NUTRITION DIAGNOSIS: Unintentional weight loss related to newly diagnosed gastric cancer as evidenced by reported abdominal pain and 10% (18 lb) weight loss in 8 months which is insignificant for time frame   INTERVENTION:  Educated on the  importance of adequate calorie and protein energy intake to maintain strength, weights, nutrition Encouraged eating smaller meals and snacks more frequently, handout with recipes provided Continue to avoid greasy, spicy, acidic foods Suggested including protein source with bedtime snack Encouraged drinking oral nutrition supplement for added calories and protein, recommended Ensure Plus/equivalent daily - samples with coupons provided  Contact information provided     MONITORING, EVALUATION, GOAL: weight trends, intake   Next Visit: f/u to be scheduled pending treatment plan

## 2021-05-30 NOTE — H&P (View-Only) (Signed)
Steven Martinez; [id number]; 09-Jul-1991   HPI Patient is a 30 year old Hispanic male who was referred to my care by Dr. [name] of gastroenterology and Dr. Delton Coombes of oncology for evaluation and treatment of gastric carcinoma.  He had undergone multiple EGDs for GERD and epigastric pain.  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  At this time, he is primarily asymptomatic.  He has seen Dr. Delton Coombes and is currently undergoing further testing for staging purposes.  This includes an EGD with EUS and a PET scan.  He is being considered for neoadjuvant chemotherapy prior to any surgical intervention. Past Medical History:  Diagnosis Date   Acute upper GI bleed 10/13/2020   gastric ulcer with visible vessel   Alcohol abuse 10/13/2020   Quit Jan 2022   GERD (gastroesophageal reflux disease)    Positive H. pylori test 09/2020   H pylori IgG + Jan 2022 s/p treatment with Prevpac. EGD March 2022 with gastric biopsy negative for H. pylori.     Past Surgical History:  Procedure Laterality Date   BIOPSY  12/03/2020   Procedure: BIOPSY;  Surgeon: Eloise Harman, DO;  [facility];  Service: Endoscopy;;   BIOPSY  05/12/2021   Procedure: BIOPSY;  Surgeon: Eloise Harman, DO;  [facility];  Service: Endoscopy;;   ESOPHAGOGASTRODUODENOSCOPY (EGD) WITH PROPOFOL N/A 10/14/2020    Surgeon: Eloise Harman, DO;  one non-bleeding, non-obstructing gastric ulcer with visible vessel s/p bipolar cautery, normal examined esophagus and duodenum.    ESOPHAGOGASTRODUODENOSCOPY (EGD) WITH PROPOFOL N/A 12/03/2020    Surgeon: Eloise Harman, DO;   nonobstructing, nonbleeding gastric ulcer with clean base s/p biopsied, gastritis biopsied, normal examined duodenum.  Pathology with granulation tissue consistent with ulcer, mild chronic gastritis with focal intestinal metaplasia, H. pylori negative.  Repeat in 3 months.   ESOPHAGOGASTRODUODENOSCOPY (EGD) WITH PROPOFOL  N/A 05/12/2021   Procedure: ESOPHAGOGASTRODUODENOSCOPY (EGD) WITH PROPOFOL;  Surgeon: Eloise Harman, DO;  [facility];  Service: Endoscopy;  Laterality: N/A;  10:30am    Family History  Problem Relation Age of Onset   Diabetes Mellitus II Mother    Hypertension Mother    Colon cancer Neg Hx    Colon polyps Neg Hx     Current Outpatient Medications on File Prior to Visit  Medication Sig Dispense Refill   Multiple Vitamin (MULTIVITAMIN WITH MINERALS) TABS tablet Take 1 tablet by mouth in the morning.     pantoprazole (PROTONIX) 40 MG tablet Take 1 tablet (40 mg total) by mouth 2 (two) times daily before a meal. 60 tablet 5   sucralfate (CARAFATE) 1 GM/10ML suspension Take 10 mLs (1 g total) by mouth 4 (four) times daily -  with meals and at bedtime. 420 mL 0   No current facility-administered medications on file prior to visit.    No Known Allergies  Social History   Substance and Sexual Activity  Alcohol Use Not Currently   Alcohol/week: 57.0 - 58.0 standard drinks   Types: 54 Cans of beer, 3 - 4 Shots of liquor per week   Comment: Quit January 2022. Used to drink sixpack daily and 12-15 beers on Saturdays and Sundays.     Social History   Tobacco Use  Smoking Status Never  Smokeless Tobacco Never    Review of Systems  Constitutional: Negative.   HENT: Negative.    Eyes: Negative.   Respiratory: Negative.    Cardiovascular: Negative.   Gastrointestinal:  Positive for abdominal pain.  Genitourinary: Negative.   Musculoskeletal: Negative.   Skin: Negative.   Neurological: Negative.   Endo/Heme/Allergies: Negative.   Psychiatric/Behavioral: Negative.     Objective   Vitals:   05/29/21 0902  BP: 136/87  Pulse: 81  Resp: 14  Temp: 98.6 F (37 C)  SpO2: 99%    Physical Exam Vitals reviewed.  Constitutional:      Appearance: Normal appearance. He is normal weight. He is not ill-appearing.  HENT:     Head: Normocephalic and atraumatic.   Cardiovascular:     Rate and Rhythm: Normal rate and regular rhythm.     Heart sounds: Normal heart sounds. No murmur heard.   No gallop.  Pulmonary:     Effort: Pulmonary effort is normal. No respiratory distress.     Breath sounds: Normal breath sounds. No stridor. No wheezing, rhonchi or rales.  Abdominal:     General: Abdomen is flat. Bowel sounds are normal. There is no distension.     Palpations: Abdomen is soft. There is no mass.     Tenderness: There is no abdominal tenderness. There is no guarding or rebound.     Hernia: No hernia is present.  Skin:    General: Skin is warm and dry.  Neurological:     Mental Status: He is alert and oriented to person, place, and time.   GI and oncology notes reviewed Assessment  Gastric carcinoma of the antrum Plan  Patient is currently undergoing staging imaging.  Further management is pending those results.  I will be in discussion with Dr. Delton Coombes as to the ultimate treatment plan.  I did discuss Port-A-Cath insertion with the patient and wife.  Literature was given.  Follow-up is pending further work-up.

## 2021-05-30 NOTE — Progress Notes (Signed)
Steven Martinez; [id number]; 09-Jul-1991   HPI Patient is a 30 year old Hispanic male who was referred to my care by Dr. [name] of gastroenterology and Dr. Delton Coombes of oncology for evaluation and treatment of gastric carcinoma.  He had undergone multiple EGDs for GERD and epigastric pain.  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  At this time, he is primarily asymptomatic.  He has seen Dr. Delton Coombes and is currently undergoing further testing for staging purposes.  This includes an EGD with EUS and a PET scan.  He is being considered for neoadjuvant chemotherapy prior to any surgical intervention. Past Medical History:  Diagnosis Date   Acute upper GI bleed 10/13/2020   gastric ulcer with visible vessel   Alcohol abuse 10/13/2020   Quit Jan 2022   GERD (gastroesophageal reflux disease)    Positive H. pylori test 09/2020   H pylori IgG + Jan 2022 s/p treatment with Prevpac. EGD March 2022 with gastric biopsy negative for H. pylori.     Past Surgical History:  Procedure Laterality Date   BIOPSY  12/03/2020   Procedure: BIOPSY;  Surgeon: Eloise Harman, DO;  [facility];  Service: Endoscopy;;   BIOPSY  05/12/2021   Procedure: BIOPSY;  Surgeon: Eloise Harman, DO;  [facility];  Service: Endoscopy;;   ESOPHAGOGASTRODUODENOSCOPY (EGD) WITH PROPOFOL N/A 10/14/2020    Surgeon: Eloise Harman, DO;  one non-bleeding, non-obstructing gastric ulcer with visible vessel s/p bipolar cautery, normal examined esophagus and duodenum.    ESOPHAGOGASTRODUODENOSCOPY (EGD) WITH PROPOFOL N/A 12/03/2020    Surgeon: Eloise Harman, DO;   nonobstructing, nonbleeding gastric ulcer with clean base s/p biopsied, gastritis biopsied, normal examined duodenum.  Pathology with granulation tissue consistent with ulcer, mild chronic gastritis with focal intestinal metaplasia, H. pylori negative.  Repeat in 3 months.   ESOPHAGOGASTRODUODENOSCOPY (EGD) WITH PROPOFOL  N/A 05/12/2021   Procedure: ESOPHAGOGASTRODUODENOSCOPY (EGD) WITH PROPOFOL;  Surgeon: Eloise Harman, DO;  [facility];  Service: Endoscopy;  Laterality: N/A;  10:30am    Family History  Problem Relation Age of Onset   Diabetes Mellitus II Mother    Hypertension Mother    Colon cancer Neg Hx    Colon polyps Neg Hx     Current Outpatient Medications on File Prior to Visit  Medication Sig Dispense Refill   Multiple Vitamin (MULTIVITAMIN WITH MINERALS) TABS tablet Take 1 tablet by mouth in the morning.     pantoprazole (PROTONIX) 40 MG tablet Take 1 tablet (40 mg total) by mouth 2 (two) times daily before a meal. 60 tablet 5   sucralfate (CARAFATE) 1 GM/10ML suspension Take 10 mLs (1 g total) by mouth 4 (four) times daily -  with meals and at bedtime. 420 mL 0   No current facility-administered medications on file prior to visit.    No Known Allergies  Social History   Substance and Sexual Activity  Alcohol Use Not Currently   Alcohol/week: 57.0 - 58.0 standard drinks   Types: 54 Cans of beer, 3 - 4 Shots of liquor per week   Comment: Quit January 2022. Used to drink sixpack daily and 12-15 beers on Saturdays and Sundays.     Social History   Tobacco Use  Smoking Status Never  Smokeless Tobacco Never    Review of Systems  Constitutional: Negative.   HENT: Negative.    Eyes: Negative.   Respiratory: Negative.    Cardiovascular: Negative.   Gastrointestinal:  Positive for abdominal pain.  Genitourinary: Negative.   Musculoskeletal: Negative.   Skin: Negative.   Neurological: Negative.   Endo/Heme/Allergies: Negative.   Psychiatric/Behavioral: Negative.     Objective   Vitals:   05/29/21 0902  BP: 136/87  Pulse: 81  Resp: 14  Temp: 98.6 F (37 C)  SpO2: 99%    Physical Exam Vitals reviewed.  Constitutional:      Appearance: Normal appearance. He is normal weight. He is not ill-appearing.  HENT:     Head: Normocephalic and atraumatic.   Cardiovascular:     Rate and Rhythm: Normal rate and regular rhythm.     Heart sounds: Normal heart sounds. No murmur heard.   No gallop.  Pulmonary:     Effort: Pulmonary effort is normal. No respiratory distress.     Breath sounds: Normal breath sounds. No stridor. No wheezing, rhonchi or rales.  Abdominal:     General: Abdomen is flat. Bowel sounds are normal. There is no distension.     Palpations: Abdomen is soft. There is no mass.     Tenderness: There is no abdominal tenderness. There is no guarding or rebound.     Hernia: No hernia is present.  Skin:    General: Skin is warm and dry.  Neurological:     Mental Status: He is alert and oriented to person, place, and time.   GI and oncology notes reviewed Assessment  Gastric carcinoma of the antrum Plan  Patient is currently undergoing staging imaging.  Further management is pending those results.  I will be in discussion with Dr. Delton Coombes as to the ultimate treatment plan.  I did discuss Port-A-Cath insertion with the patient and wife.  Literature was given.  Follow-up is pending further work-up.

## 2021-05-31 NOTE — Progress Notes (Signed)
[facility] [address], [place] [address]   CLINIC:  Medical Oncology/Hematology  PCP:  Alfonse Flavors, MD [address] [place] [address] [phone]   REASON FOR VISIT:  Follow-up for adenocarcinoma of the stomach  PRIOR THERAPY: none  NGS Results: not done  CURRENT THERAPY: under work-up  BRIEF ONCOLOGIC HISTORY:  Oncology History   No history exists.    CANCER STAGING: Cancer Staging No matching staging information was found for the patient.  INTERVAL HISTORY:  [name], a 30 y.o. male, returns for routine follow-up of his adenocarcinoma of the stomach. Joseff was last seen on 05/20/2021.   Today he reports feeling good, and he is accompanied by his wife who is acting as an Astronomer. He reports stable abdominal pain when hungry.   REVIEW OF SYSTEMS:  Review of Systems  Constitutional:  Negative for appetite change and fatigue (75%).  Gastrointestinal:  Positive for abdominal pain.  All other systems reviewed and are negative.  PAST MEDICAL/SURGICAL HISTORY:  Past Medical History:  Diagnosis Date   Acute upper GI bleed 10/13/2020   gastric ulcer with visible vessel   Alcohol abuse 10/13/2020   Quit Jan 2022   GERD (gastroesophageal reflux disease)    Positive H. pylori test 09/2020   H pylori IgG + Jan 2022 s/p treatment with Prevpac. EGD March 2022 with gastric biopsy negative for H. pylori.    Past Surgical History:  Procedure Laterality Date   BIOPSY  12/03/2020   Procedure: BIOPSY;  Surgeon: Eloise Harman, DO;  [facility];  Service: Endoscopy;;   BIOPSY  05/12/2021   Procedure: BIOPSY;  Surgeon: Eloise Harman, DO;  [facility];  Service: Endoscopy;;   ESOPHAGOGASTRODUODENOSCOPY (EGD) WITH PROPOFOL N/A 10/14/2020    Surgeon: Eloise Harman, DO;  one non-bleeding, non-obstructing gastric ulcer with visible vessel s/p bipolar cautery, normal examined esophagus and  duodenum.    ESOPHAGOGASTRODUODENOSCOPY (EGD) WITH PROPOFOL N/A 12/03/2020    Surgeon: Eloise Harman, DO;   nonobstructing, nonbleeding gastric ulcer with clean base s/p biopsied, gastritis biopsied, normal examined duodenum.  Pathology with granulation tissue consistent with ulcer, mild chronic gastritis with focal intestinal metaplasia, H. pylori negative.  Repeat in 3 months.   ESOPHAGOGASTRODUODENOSCOPY (EGD) WITH PROPOFOL N/A 05/12/2021   Procedure: ESOPHAGOGASTRODUODENOSCOPY (EGD) WITH PROPOFOL;  Surgeon: Eloise Harman, DO;  [facility];  Service: Endoscopy;  Laterality: N/A;  10:30am    SOCIAL HISTORY:  Social History   Socioeconomic History   Marital status: Married    Spouse name: Not on file   Number of children: Not on file   Years of education: Not on file   Highest education level: Not on file  Occupational History   Not on file  Tobacco Use   Smoking status: Never   Smokeless tobacco: Never  Vaping Use   Vaping Use: Never used  Substance and Sexual Activity   Alcohol use: Not Currently    Alcohol/week: 57.0 - 58.0 standard drinks    Types: 54 Cans of beer, 3 - 4 Shots of liquor per week    Comment: Quit January 2022. Used to drink sixpack daily and 12-15 beers on Saturdays and Sundays.    Drug use: Never   Sexual activity: Yes  Other Topics Concern   Not on file  Social History Narrative   Not on file   Social Determinants of Health   Financial  Resource Strain: Low Risk    Difficulty of Paying Living Expenses: Not hard at all  Food Insecurity: No Food Insecurity   Worried About Charity fundraiser in the Last Year: Never true   Ran Out of Food in the Last Year: Never true  Transportation Needs: No Transportation Needs   Lack of Transportation (Medical): No   Lack of Transportation (Non-Medical): No  Physical Activity: Sufficiently Active   Days of Exercise per Week: 5 days   Minutes of Exercise per Session: 60 min  Stress: No Stress  Concern Present   Feeling of Stress : Not at all  Social Connections: Moderately Isolated   Frequency of Communication with Friends and Family: More than three times a week   Frequency of Social Gatherings with Friends and Family: More than three times a week   Attends Religious Services: Never   Marine scientist or Organizations: No   Attends Music therapist: Never   Marital Status: Married  Human resources officer Violence: Not At Risk   Fear of Current or Ex-Partner: No   Emotionally Abused: No   Physically Abused: No   Sexually Abused: No    FAMILY HISTORY:  Family History  Problem Relation Age of Onset   Diabetes Mellitus II Mother    Hypertension Mother    Colon cancer Neg Hx    Colon polyps Neg Hx     CURRENT MEDICATIONS:  Current Outpatient Medications  Medication Sig Dispense Refill   Multiple Vitamin (MULTIVITAMIN WITH MINERALS) TABS tablet Take 1 tablet by mouth in the morning.     sucralfate (CARAFATE) 1 GM/10ML suspension Take 10 mLs (1 g total) by mouth 4 (four) times daily -  with meals and at bedtime. 420 mL 0   pantoprazole (PROTONIX) 40 MG tablet Take 1 tablet (40 mg total) by mouth 2 (two) times daily before a meal. 60 tablet 5   No current facility-administered medications for this visit.    ALLERGIES:  No Known Allergies  PHYSICAL EXAM:  Performance status (ECOG): 0 - Asymptomatic  Vitals:   06/02/21 1243  BP: (!) 146/87  Pulse: 91  Resp: 18  Temp: (!) 97 F (36.1 C)  SpO2: 100%   Wt Readings from Last 3 Encounters:  06/02/21 157 lb 12.8 oz (71.6 kg)  05/29/21 159 lb (72.1 kg)  05/20/21 156 lb 4.9 oz (70.9 kg)   Physical Exam Vitals reviewed.  Constitutional:      Appearance: Normal appearance.  Cardiovascular:     Rate and Rhythm: Normal rate and regular rhythm.     Pulses: Normal pulses.     Heart sounds: Normal heart sounds.  Pulmonary:     Effort: Pulmonary effort is normal.     Breath sounds: Normal breath sounds.   Neurological:     General: No focal deficit present.     Mental Status: He is alert and oriented to person, place, and time.  Psychiatric:        Mood and Affect: Mood normal.        Behavior: Behavior normal.     LABORATORY DATA:  I have reviewed the labs as listed.  CBC Latest Ref Rng & Units 04/14/2021 02/03/2021 12/02/2020  WBC 4.0 - 10.5 K/uL 4.6 4.0 3.8(L)  Hemoglobin 13.0 - 17.0 g/dL 14.9 13.8 10.3(L)  Hematocrit 39.0 - 52.0 % 45.4 43.0 33.9(L)  Platelets 150 - 400 K/uL 268 210 311   CMP Latest Ref Rng & Units 05/07/2021 12/02/2020 10/14/2020  Glucose 70 - 99 mg/dL 86 111(H) 115(H)  BUN 6 - 20 mg/dL '14 10 18  '$ Creatinine 0.61 - 1.24 mg/dL 0.84 0.83 0.86  Sodium 135 - 145 mmol/L 137 139 138  Potassium 3.5 - 5.1 mmol/L 3.8 3.7 4.4  Chloride 98 - 111 mmol/L 104 106 109  CO2 22 - 32 mmol/L '28 24 26  '$ Calcium 8.9 - 10.3 mg/dL 8.8(L) 8.9 8.2(L)  Total Protein 6.5 - 8.1 g/dL 7.1 7.2 6.0(L)  Total Bilirubin 0.3 - 1.2 mg/dL 0.6 0.4 0.5  Alkaline Phos 38 - 126 U/L 75 62 56  AST 15 - 41 U/L '21 15 16  '$ ALT 0 - 44 U/L '22 12 19    '$ DIAGNOSTIC IMAGING:  I have independently reviewed the scans and discussed with the patient. CT CHEST ABDOMEN PELVIS W CONTRAST  Result Date: 05/16/2021 CLINICAL DATA:  Gastric adenocarcinoma.  New diagnosis. EXAM: CT CHEST, ABDOMEN, AND PELVIS WITH CONTRAST TECHNIQUE: Multidetector CT imaging of the chest, abdomen and pelvis was performed following the standard protocol during bolus administration of intravenous contrast. CONTRAST:  35m OMNIPAQUE IOHEXOL 350 MG/ML SOLN COMPARISON:  01/23/2020 chest radiograph. 05/12/2021 upper endoscopy also reviewed. FINDINGS: CT CHEST FINDINGS Cardiovascular: Normal aortic caliber. Normal heart size, without pericardial effusion. No central pulmonary embolism, on this non-dedicated study. Mediastinum/Nodes: No supraclavicular adenopathy. No mediastinal or hilar adenopathy. Lungs/Pleura: No pleural fluid. Isolated right middle lobe  pulmonary nodule of 4 mm on 89/3. Musculoskeletal: No acute osseous abnormality. CT ABDOMEN PELVIS FINDINGS Hepatobiliary: Nonspecific caudate lobe enlargement. No focal liver lesion. Normal gallbladder, without biliary ductal dilatation. Pancreas: Normal, without mass or ductal dilatation. Spleen: Normal in size, without focal abnormality. Adrenals/Urinary Tract: Normal adrenal glands. Normal kidneys, without hydronephrosis. Normal urinary bladder. Stomach/Bowel: Gastric antral soft tissue thickening/mass, including at 3.7 x 3.0 cm on 55/2. No gastric outlet obstruction. No perigastric extension. Colonic stool burden suggests constipation. Normal terminal ileum and appendix. Normal small bowel. Vascular/Lymphatic: Normal caliber of the aorta and branch vessels. 1.2 cm portacaval node on 59/2 is upper normal sized. A node within the perigastric fat anterior to the pylorus measures 5 mm on 57/2. No pelvic sidewall adenopathy. Reproductive: Normal prostate. Other: No significant free fluid. No evidence of omental or peritoneal disease. Musculoskeletal: No acute osseous abnormality. IMPRESSION: 1. Gastric antral mass, likely corresponding to the endoscopic finding. Upper normal sized portacaval node and prominent anterior perigastric node are technically indeterminate. Especially the perigastric node is suspicious, based on location. 2. No evidence of distant metastatic disease within the abdomen or pelvis. 3. Isolated 4 mm right middle lobe pulmonary nodule is most likely incidental/benign. Isolated pulmonary metastasis felt less likely. Consider chest CT follow-up at 6 months. 4.  Possible constipation. Electronically Signed   By: KAbigail MiyamotoM.D.   On: 05/16/2021 11:25   NM PET Image Initial (PI) Skull Base To Thigh (F-18 FDG)  Result Date: 05/31/2021 CLINICAL DATA:  Initial treatment strategy for gastric adenocarcinoma. EXAM: NUCLEAR MEDICINE PET SKULL BASE TO THIGH TECHNIQUE: 7.54 mCi F-18 FDG was injected  intravenously. Full-ring PET imaging was performed from the skull base to thigh after the radiotracer. CT data was obtained and used for attenuation correction and anatomic localization. Fasting blood glucose: 124 mg/dl COMPARISON:  05/14/2021 FINDINGS: Mediastinal blood pool activity: SUV max 1.14 Liver activity: SUV max NA NECK: No hypermetabolic lymph nodes in the neck. Incidental CT findings: Asymmetric opacification of the left maxillary sinus CHEST: No hypermetabolic mediastinal or hilar nodes. No suspicious pulmonary nodules on  the CT scan. Incidental CT findings: 3 mm nodule identified in the right middle lobe, image 139/2. This is too small to characterize by PET-CT ABDOMEN/PELVIS: No abnormal hypermetabolic activity within the liver, pancreas, adrenal glands, or spleen. Mass within the distal stomach measures approximately 4.9 x 2.8 cm with SUV max of 5.32. -Small gastrohepatic ligament lymph node (posterior to the antrum) measures 0.7 cm with low-level FDG uptake, SUV max is 1.24, image 221/7. -lymph node within the perigastric fat (anterior to the pylorus) is stable measuring 5 mm and has an SUV max of 0.3, image 146/3. -portacaval node measures 1.1 cm and has an SUV max of 1.0, image 153/3. Incidental CT findings: none SKELETON: No focal hypermetabolic activity to suggest skeletal metastasis. Incidental CT findings: none IMPRESSION: 1. Mass within the gastric antrum is FDG avid within SUV max of 5.32 compatible with primary gastric adenocarcinoma. 2. No definite evidence for FDG avid nodal metastasis or distant metastatic disease. 3. There is only low-level FDG uptake associated with the previously described prominent upper abdominal lymph nodes, as described above. Electronically Signed   By: Kerby Moors M.D.   On: 05/31/2021 15:30     ASSESSMENT:  1.  Gastric antral adenocarcinoma: - EGD on 05/12/2021 gastric antral mass with large cratered ulcer.  Normal duodenal bulb and first part of the  duodenum. - Biopsy of the antrum ulcer consistent with adenocarcinoma, at least intramucosal.  Depth of invasion cannot be judged as the biopsy was superficial. - CT CAP on 05/14/2021 with gastric antral soft tissue thickening/mass measuring 3.7 x 3 cm.  1.2 cm portacaval lymph node.  Node within the perigastric fat anterior to the pylorus measures 5 mm.  No evidence of distant metastatic disease in the abdomen or pelvis.  Isolated 4 mm right middle lobe lung nodule most likely incidental/benign. - 13 pound weight loss in the last 3 months.  2.  Social/family history: - He is married and seen with his wife today. - He does Architect work. - He quit smoking cigarettes 13 years ago.  Quit drinking alcohol in March 2022. - No family history of malignancies.   PLAN:  1.  Gastric antral adenocarcinoma: - Reviewed PET scan from 05/29/2021.  Mass within the distal stomach measures 4.9 x 2.8 cm with SUV 5.32.  Small gastrohepatic ligament lymph node 0.7 cm with SUV 1.24.  Lymph node in the perigastric fat 5 mm, SUV 0.3.  Portacaval lymph node 1.1 cm with SUV 1.  No other evidence of metastatic disease. - Recommend EGD/EUS assessment of the tumor for T staging.  Would also recommend a biopsy of the lymph nodes if feasible. - If he has a T2 or higher primary, would recommend perioperative chemotherapy followed by surgery. - RTC after the EGD/EUS.  2.  Weight loss: - He had 13 pound weight loss in the last 3 months. - Reports left upper quadrant pain around 2 AM in the morning which subsides after eating.  He has to eat frequently due to the pain.  Denies any nausea or vomiting.   Orders placed this encounter:  No orders of the defined types were placed in this encounter.    Derek Jack, MD [place] [phone]   I, Thana Ates, am acting as a scribe for Dr. Derek Jack.  I, Derek Jack MD, have reviewed the above documentation for accuracy and  completeness, and I agree with the above.

## 2021-06-02 ENCOUNTER — Other Ambulatory Visit: Payer: Self-pay

## 2021-06-02 ENCOUNTER — Inpatient Hospital Stay (HOSPITAL_BASED_OUTPATIENT_CLINIC_OR_DEPARTMENT_OTHER): Payer: Self-pay | Admitting: Hematology

## 2021-06-02 ENCOUNTER — Telehealth: Payer: Self-pay | Admitting: Gastroenterology

## 2021-06-02 VITALS — BP 146/87 | HR 91 | Temp 97.0°F | Resp 18 | Wt 157.8 lb

## 2021-06-02 DIAGNOSIS — C169 Malignant neoplasm of stomach, unspecified: Secondary | ICD-10-CM

## 2021-06-02 NOTE — Progress Notes (Signed)
Patient's wife [place] is acting as interpreter and answered all of the questions documented in chart for patient.

## 2021-06-02 NOTE — Telephone Encounter (Signed)
I called and notified Amy that Dr Ardis Hughs is out of the office today and we will contact her as soon as he returns with his recommendations.

## 2021-06-02 NOTE — Patient Instructions (Addendum)
[facility] at [facility] Discharge Instructions  You were seen today by Dr. Katragadda. He went over your recent results and scans. Dr. Katragadda will see you back in for labs and follow up.   Thank you for choosing [facility] at [facility] to provide your oncology and hematology care.  To afford each patient quality time with our provider, please arrive at least 15 minutes before your scheduled appointment time.   If you have a lab appointment with the Cancer Center please come in thru the Main Entrance and check in at the main information desk  You need to re-schedule your appointment should you arrive 10 or more minutes late.  We strive to give you quality time with our providers, and arriving late affects you and other patients whose appointments are after yours.  Also, if you no show three or more times for appointments you may be dismissed from the clinic at the providers discretion.     Again, thank you for choosing [facility].  Our hope is that these requests will decrease the amount of time that you wait before being seen by our physicians.       _____________________________________________________________  Should you have questions after your visit to [facility], please contact our office at [phone] between the hours of 8:00 a.m. and 4:30 p.m.  Voicemails left after 4:00 p.m. will not be returned until the following business day.  For prescription refill requests, have your pharmacy contact our office and allow 72 hours.    Cancer Center Support Programs:   > Cancer Support Group  2nd Tuesday of the month 1pm-2pm, Journey Room   

## 2021-06-03 ENCOUNTER — Other Ambulatory Visit: Payer: Self-pay

## 2021-06-03 DIAGNOSIS — C169 Malignant neoplasm of stomach, unspecified: Secondary | ICD-10-CM

## 2021-06-03 NOTE — Telephone Encounter (Signed)
EUS scheduled, pt instructed and medications reviewed.  Patient instructions mailed to home.  Patient to call with any questions or concerns.  

## 2021-06-03 NOTE — Telephone Encounter (Signed)
Pt's wife Tanzania, who speaks fluent English, returned call and was transfer to nurse Patty's extension.

## 2021-06-03 NOTE — Telephone Encounter (Signed)
Agree. Got it. GM

## 2021-06-03 NOTE — Telephone Encounter (Signed)
The pt has been scheduled for 06/19/21 at [facility] at 11 am for EUS with DJ.  Left message on machine to call back

## 2021-06-09 NOTE — Progress Notes (Signed)
Attempted to obtain medical history via telephone, unable to reach at this time.  Unable to leave message as the voicemail was full.

## 2021-06-09 NOTE — Progress Notes (Signed)
Interpreter requested for procedure scheduled for 06-19-21.

## 2021-06-16 ENCOUNTER — Other Ambulatory Visit: Payer: Self-pay | Admitting: Internal Medicine

## 2021-06-16 DIAGNOSIS — K250 Acute gastric ulcer with hemorrhage: Secondary | ICD-10-CM

## 2021-06-19 ENCOUNTER — Other Ambulatory Visit (HOSPITAL_COMMUNITY): Payer: Self-pay | Admitting: Hematology

## 2021-06-19 ENCOUNTER — Encounter (HOSPITAL_COMMUNITY): Payer: Self-pay

## 2021-06-19 ENCOUNTER — Ambulatory Visit (HOSPITAL_COMMUNITY)
Admission: RE | Admit: 2021-06-19 | Discharge: 2021-06-19 | Disposition: A | Discharge status: Home / Self Care | Payer: Self-pay | Source: Ambulatory Visit | Attending: Gastroenterology | Admitting: Gastroenterology

## 2021-06-19 ENCOUNTER — Ambulatory Visit (HOSPITAL_COMMUNITY): Payer: Self-pay | Admitting: Anesthesiology

## 2021-06-19 ENCOUNTER — Encounter (HOSPITAL_COMMUNITY)
Admission: RE | Admit: 2021-06-19 | Discharge: 2021-06-19 | Disposition: A | Discharge status: Home / Self Care | Payer: 59 | Source: Ambulatory Visit | Attending: General Surgery | Admitting: General Surgery

## 2021-06-19 ENCOUNTER — Encounter (HOSPITAL_COMMUNITY): Payer: Self-pay | Admitting: Gastroenterology

## 2021-06-19 ENCOUNTER — Other Ambulatory Visit (HOSPITAL_COMMUNITY): Payer: Self-pay

## 2021-06-19 ENCOUNTER — Encounter (HOSPITAL_COMMUNITY)
Admission: RE | Disposition: A | Discharge status: Home / Self Care | Payer: Self-pay | Source: Ambulatory Visit | Attending: Gastroenterology

## 2021-06-19 ENCOUNTER — Inpatient Hospital Stay (HOSPITAL_COMMUNITY): Payer: Self-pay

## 2021-06-19 ENCOUNTER — Other Ambulatory Visit: Payer: Self-pay

## 2021-06-19 DIAGNOSIS — C169 Malignant neoplasm of stomach, unspecified: Secondary | ICD-10-CM | POA: Insufficient documentation

## 2021-06-19 DIAGNOSIS — Z8619 Personal history of other infectious and parasitic diseases: Secondary | ICD-10-CM | POA: Insufficient documentation

## 2021-06-19 DIAGNOSIS — K219 Gastro-esophageal reflux disease without esophagitis: Secondary | ICD-10-CM | POA: Insufficient documentation

## 2021-06-19 DIAGNOSIS — Z79899 Other long term (current) drug therapy: Secondary | ICD-10-CM | POA: Insufficient documentation

## 2021-06-19 DIAGNOSIS — K250 Acute gastric ulcer with hemorrhage: Secondary | ICD-10-CM

## 2021-06-19 HISTORY — PX: EUS: SHX5427

## 2021-06-19 HISTORY — PX: ESOPHAGOGASTRODUODENOSCOPY (EGD) WITH PROPOFOL: SHX5813

## 2021-06-19 LAB — CBC WITH DIFFERENTIAL/PLATELET
Abs Immature Granulocytes: 0.01 10*3/uL (ref 0.00–0.07)
Basophils Absolute: 0 10*3/uL (ref 0.0–0.1)
Basophils Relative: 1 %
Eosinophils Absolute: 0 10*3/uL (ref 0.0–0.5)
Eosinophils Relative: 0 %
HCT: 32 % — ABNORMAL LOW (ref 39.0–52.0)
Hemoglobin: 10.3 g/dL — ABNORMAL LOW (ref 13.0–17.0)
Immature Granulocytes: 0 %
Lymphocytes Relative: 24 %
Lymphs Abs: 1.2 10*3/uL (ref 0.7–4.0)
MCH: 26.2 pg (ref 26.0–34.0)
MCHC: 32.2 g/dL (ref 30.0–36.0)
MCV: 81.4 fL (ref 80.0–100.0)
Monocytes Absolute: 0.2 10*3/uL (ref 0.1–1.0)
Monocytes Relative: 5 %
Neutro Abs: 3.4 10*3/uL (ref 1.7–7.7)
Neutrophils Relative %: 70 %
Platelets: 275 10*3/uL (ref 150–400)
RBC: 3.93 MIL/uL — ABNORMAL LOW (ref 4.22–5.81)
RDW: 13.2 % (ref 11.5–15.5)
WBC: 4.9 10*3/uL (ref 4.0–10.5)
nRBC: 0 % (ref 0.0–0.2)

## 2021-06-19 LAB — COMPREHENSIVE METABOLIC PANEL
ALT: 21 U/L (ref 0–44)
AST: 20 U/L (ref 15–41)
Albumin: 4 g/dL (ref 3.5–5.0)
Alkaline Phosphatase: 78 U/L (ref 38–126)
Anion gap: 7 (ref 5–15)
BUN: 10 mg/dL (ref 6–20)
CO2: 25 mmol/L (ref 22–32)
Calcium: 8.6 mg/dL — ABNORMAL LOW (ref 8.9–10.3)
Chloride: 105 mmol/L (ref 98–111)
Creatinine, Ser: 0.71 mg/dL (ref 0.61–1.24)
GFR, Estimated: >60 mL/min (ref >60)
Glucose, Bld: 116 mg/dL — ABNORMAL HIGH (ref 70–99)
Potassium: 3.7 mmol/L (ref 3.5–5.1)
Sodium: 137 mmol/L (ref 135–145)
Total Bilirubin: 0.3 mg/dL (ref 0.3–1.2)
Total Protein: 7 g/dL (ref 6.5–8.1)

## 2021-06-19 SURGERY — UPPER ENDOSCOPIC ULTRASOUND (EUS) RADIAL
Anesthesia: Monitor Anesthesia Care

## 2021-06-19 MED ORDER — PROPOFOL 10 MG/ML IV BOLUS
INTRAVENOUS | Status: DC | PRN
  Administered 2021-06-19: 20 mg via INTRAVENOUS
  Administered 2021-06-19 (×2): 30 mg via INTRAVENOUS

## 2021-06-19 MED ORDER — LACTATED RINGERS IV SOLN: INTRAVENOUS | Status: DC

## 2021-06-19 MED ORDER — PANTOPRAZOLE SODIUM 40 MG PO TBEC: 40.0000 mg | DELAYED_RELEASE_TABLET | Freq: Two times a day (BID) | ORAL | 5 refills | Status: DC

## 2021-06-19 MED ORDER — PROPOFOL 500 MG/50ML IV EMUL
INTRAVENOUS | Status: DC | PRN
  Administered 2021-06-19: 150 ug/kg/min via INTRAVENOUS

## 2021-06-19 MED ORDER — LIDOCAINE 2% (20 MG/ML) 5 ML SYRINGE
INTRAMUSCULAR | Status: DC | PRN
  Administered 2021-06-19: 100 mg via INTRAVENOUS

## 2021-06-19 MED ORDER — SODIUM CHLORIDE 0.9 % IV SOLN: INTRAVENOUS | Status: DC

## 2021-06-19 NOTE — H&P (Signed)
Steven Martinez; [id number]; 09-Jul-1991   HPI Patient is a 30 year old Hispanic male who was referred to my care by Dr. [name] of gastroenterology and Dr. Delton Coombes of oncology for evaluation and treatment of gastric carcinoma.  He had undergone multiple EGDs for GERD and epigastric pain.  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  At this time, he is primarily asymptomatic.  He has seen Dr. Delton Coombes and is currently undergoing further testing for staging purposes.  This includes an EGD with EUS and a PET scan.  He is being considered for neoadjuvant chemotherapy prior to any surgical intervention. Past Medical History:  Diagnosis Date   Acute upper GI bleed 10/13/2020   gastric ulcer with visible vessel   Alcohol abuse 10/13/2020   Quit Jan 2022   GERD (gastroesophageal reflux disease)    Positive H. pylori test 09/2020   H pylori IgG + Jan 2022 s/p treatment with Prevpac. EGD March 2022 with gastric biopsy negative for H. pylori.     Past Surgical History:  Procedure Laterality Date   BIOPSY  12/03/2020   Procedure: BIOPSY;  Surgeon: Eloise Harman, DO;  Location: [facility];  Service: Endoscopy;;   BIOPSY  05/12/2021   Procedure: BIOPSY;  Surgeon: Eloise Harman, DO;  Location: [facility];  Service: Endoscopy;;   ESOPHAGOGASTRODUODENOSCOPY (EGD) WITH PROPOFOL N/A 10/14/2020    Surgeon: Eloise Harman, DO;  one non-bleeding, non-obstructing gastric ulcer with visible vessel s/p bipolar cautery, normal examined esophagus and duodenum.    ESOPHAGOGASTRODUODENOSCOPY (EGD) WITH PROPOFOL N/A 12/03/2020    Surgeon: Eloise Harman, DO;   nonobstructing, nonbleeding gastric ulcer with clean base s/p biopsied, gastritis biopsied, normal examined duodenum.  Pathology with granulation tissue consistent with ulcer, mild chronic gastritis with focal intestinal metaplasia, H. pylori negative.  Repeat in 3 months.   ESOPHAGOGASTRODUODENOSCOPY (EGD) WITH PROPOFOL  N/A 05/12/2021   Procedure: ESOPHAGOGASTRODUODENOSCOPY (EGD) WITH PROPOFOL;  Surgeon: Eloise Harman, DO;  Location: [facility];  Service: Endoscopy;  Laterality: N/A;  10:30am    Family History  Problem Relation Age of Onset   Diabetes Mellitus II Mother    Hypertension Mother    Colon cancer Neg Hx    Colon polyps Neg Hx     Current Outpatient Medications on File Prior to Visit  Medication Sig Dispense Refill   Multiple Vitamin (MULTIVITAMIN WITH MINERALS) TABS tablet Take 1 tablet by mouth in the morning.     pantoprazole (PROTONIX) 40 MG tablet Take 1 tablet (40 mg total) by mouth 2 (two) times daily before a meal. 60 tablet 5   sucralfate (CARAFATE) 1 GM/10ML suspension Take 10 mLs (1 g total) by mouth 4 (four) times daily -  with meals and at bedtime. 420 mL 0   No current facility-administered medications on file prior to visit.    No Known Allergies  Social History   Substance and Sexual Activity  Alcohol Use Not Currently   Alcohol/week: 57.0 - 58.0 standard drinks   Types: 54 Cans of beer, 3 - 4 Shots of liquor per week   Comment: Quit January 2022. Used to drink sixpack daily and 12-15 beers on Saturdays and Sundays.     Social History   Tobacco Use  Smoking Status Never  Smokeless Tobacco Never    Review of Systems  Constitutional: Negative.   HENT: Negative.    Eyes: Negative.   Respiratory: Negative.    Cardiovascular: Negative.   Gastrointestinal:  Positive for abdominal pain.  Genitourinary: Negative.   Musculoskeletal: Negative.   Skin: Negative.   Neurological: Negative.   Endo/Heme/Allergies: Negative.   Psychiatric/Behavioral: Negative.     Objective   Vitals:   05/29/21 0902  BP: 136/87  Pulse: 81  Resp: 14  Temp: 98.6 F (37 C)  SpO2: 99%    Physical Exam Vitals reviewed.  Constitutional:      Appearance: Normal appearance. He is normal weight. He is not ill-appearing.  HENT:     Head: Normocephalic and atraumatic.   Cardiovascular:     Rate and Rhythm: Normal rate and regular rhythm.     Heart sounds: Normal heart sounds. No murmur heard.   No gallop.  Pulmonary:     Effort: Pulmonary effort is normal. No respiratory distress.     Breath sounds: Normal breath sounds. No stridor. No wheezing, rhonchi or rales.  Abdominal:     General: Abdomen is flat. Bowel sounds are normal. There is no distension.     Palpations: Abdomen is soft. There is no mass.     Tenderness: There is no abdominal tenderness. There is no guarding or rebound.     Hernia: No hernia is present.  Skin:    General: Skin is warm and dry.  Neurological:     Mental Status: He is alert and oriented to person, place, and time.   GI and oncology notes reviewed Assessment  Gastric carcinoma of the antrum Plan  Patient is currently undergoing staging imaging.  Further management is pending those results.  I will be in discussion with Dr. Delton Coombes as to the ultimate treatment plan.  I did discuss Port-A-Cath insertion with the patient and wife.  Literature was given.  Follow-up is pending further work-up.

## 2021-06-19 NOTE — Op Note (Signed)
[facility] Patient Name: Steven Martinez Procedure Date: 06/19/2021 MRN: [id number] Attending MD: [name] , MD Date of Birth: 1990-11-30 CSN: [id number] Age: 30 Admit Type: Outpatient Procedure:                Upper EUS Indications:              Pre-treatment staging of gastric adenocarcinoma                            dignosed by Dr. [name] Providers:                [name], MD, Particia Nearing, RN, Cherylynn Ridges, Technician, Lerry Paterson, CRNA Referring MD:             Derek Jack, MD Medicines:                Monitored Anesthesia Care Complications:            No immediate complications. Estimated blood loss:                            None. Estimated Blood Loss:     Estimated blood loss: none. Procedure:                Pre-Anesthesia Assessment:                           - Prior to the procedure, a History and Physical                            was performed, and patient medications and                            allergies were reviewed. The patient's tolerance of                            previous anesthesia was also reviewed. The risks                            and benefits of the procedure and the sedation                            options and risks were discussed with the patient.                            All questions were answered, and informed consent                            was obtained. Prior Anticoagulants: The patient has                            taken no previous anticoagulant or antiplatelet  agents. ASA Grade Assessment: II - A patient with                            mild systemic disease. After reviewing the risks                            and benefits, the patient was deemed in                            satisfactory condition to undergo the procedure.                           After obtaining informed consent, the endoscope was                             passed under direct vision. Throughout the                            procedure, the patient's blood pressure, pulse, and                            oxygen saturations were monitored continuously. The                            [id number] ([id number]) Olympus radial ultrasound                            scope was introduced through the mouth, and                            advanced to the second part of duodenum. The upper                            EUS was accomplished without difficulty. The                            patient tolerated the procedure well. Scope In: Scope Out: Findings:      ENDOSCOPIC FINDING: :      The examined esophagus was endoscopically normal.      There was a 4cm cratered, non-circumferential, ulcerated mass in the       distal stomach, about 3cm proximal to the pylorus. This was clearly       malignant.      The examined duodenum was endoscopically normal.      ENDOSONOGRAPHIC FINDING: :      1. The gastric mass above correlated with a hypoechoic, heterogeneous       mass that clearly passes into and through the muscularis propria layer       of the distal gastric wall (uT3).      2. There was one suspicious, 66mm perigastric lymphnode that lay neaby       the primary gastric mass.      3. Pancreatic parenchyma was normal throughout.      4. Limited views of the liver, spleen, portal and splenic vessels were       all normal.  5. Gallbladder was normal. Impression:               - uT3N1 4cm crated, non-circumferential distal                            gastric adenocarcinoma.                           - He will likely benefit from neoadjuvant chemo                            (?XRT). Moderate Sedation:      Not Applicable - Patient had care per Anesthesia. Recommendation:           - Discharge patient to home. Procedure Code(s):        --- Professional ---                           [phone], Esophagogastroduodenoscopy, flexible,                             transoral; with endoscopic ultrasound examination                            limited to the esophagus, stomach or duodenum, and                            adjacent structures Diagnosis Code(s):        --- Professional ---                           K31.89, Other diseases of stomach and duodenum                           C16.9, Malignant neoplasm of stomach, unspecified CPT copyright 2019 [employer]. All rights reserved. The codes documented in this report are preliminary and upon coder review may  be revised to meet current compliance requirements. [name], MD 06/19/2021 11:07:49 AM This report has been signed electronically. Number of Addenda: 0

## 2021-06-19 NOTE — Pre-Procedure Instructions (Signed)
Patient for port placement 06/23/2021. He has appointment for labs in the Cancer center today. Instructions made and taken upstairs to cancer center and went over with both patient and wife by Amy.

## 2021-06-19 NOTE — Progress Notes (Signed)
Port-A-Cath order placed per Dr. Delton Coombes standing order.

## 2021-06-19 NOTE — Transfer of Care (Signed)
Immediate Anesthesia Transfer of Care Note  Patient: Steven Martinez  Procedure(s) Performed: UPPER ENDOSCOPIC ULTRASOUND (EUS) RADIAL  Patient Location: Endoscopy Unit  Anesthesia Type:MAC  Level of Consciousness: awake  Airway & Oxygen Therapy: Patient Spontanous Breathing and Patient connected to face mask oxygen  Post-op Assessment: Report given to RN and Post -op Vital signs reviewed and stable  Post vital signs: Reviewed and stable  Last Vitals:  Vitals Value Taken Time  BP 120/71 06/19/21 1038  Temp 36.7 C 06/19/21 1038  Pulse 82 06/19/21 1040  Resp 20 06/19/21 1040  SpO2 100 % 06/19/21 1040  Vitals shown include unvalidated device data.  Last Pain:  Vitals:   06/19/21 1038  TempSrc: Oral  PainSc: 0-No pain         Complications: No notable events documented.

## 2021-06-19 NOTE — Patient Instructions (Signed)
Instrucciones Para Antes de la Ciruga   Su ciruga est programada para-(your procedure is scheduled on) 06/23/2021 @ 1030.   [place] - (enter)    Por favor llame al [phone] si tiene algn problema en la maana de la ciruga. (please call if you have any problems the morning of surgery.)                  Recuerde: (Remember)   No coma alimentos ni tome lquidos, incluyendo agua, despus de la medianoche del  (Do not eat food or drink liquids including water after midnight on 06/22/2021.   Tome estas medicinas en la maana de la ciruga con un SORBITO de agua (take these meds the morning of surgery with a SIP of water)      protonix.   Puede cepillarse los dientes en la maana de la [place]. (you may brush your teeth the morning of surgery)   No use joyas, maquillaje de ojos, lpiz labial, crema para el cuerpo o esmalte de uas oscuro. (Do not wear jewelry, eye makeup, lipstick, body lotion, or dark fingernail polish)   No desodorante.   Si va a ser ingresado despues de la ciruga, deje la [employer] en el carro hasta que se le haya asignado una habitacin. (If you are to be admitted after surgery, leave suitcase in car until your room has been assigned.)   A los pacientes que se les d de alta el mismo da no se les permitir manejar a casa.  (Patients discharged on the day of surgery will not be allowed to drive home)   Use ropa suelta y cmoda de regreso a casa. (wear loose comfortable clothes for ride home)          Insercin del dispositivo de perfusin implantable, cuidados posteriores Implanted Port Insertion, Care After [employer] brinda informacin sobre cmo cuidarse despus del procedimiento. El mdico tambin podr darle indicaciones ms especficas. Comunquese con el mdico si tiene problemas o preguntas. Qu puedo esperar despus del procedimiento? Despus del procedimiento, es comn tener los  siguientes sntomas: Chemical engineer de la insercin del dispositivo. Moretones en la piel alrededor del dispositivo. Esto debera mejorar luego de 3 o 4 das. Siga estas indicaciones en su casa: Cuidado del dispositivo Luego de que le coloquen el dispositivo, le darn una tarjeta de informacin del fabricante. La tarjeta contiene informacin acerca del dispositivo. Llvela siempre con usted. Cuide el dispositivo como se lo haya indicado el mdico. Pregntele al mdico si usted o un familiar puede recibir capacitacin para cuidar del dispositivo en casa. Un enfermero a domicilio tambin puede cuidar del dispositivo. Asegrese de recordar el tipo de dispositivo que tiene. Cuidados de las incisiones   Siga las indicaciones del mdico acerca de los cuidados del lugar de la insercin del dispositivo. Asegrese de hacer lo siguiente: Lvese las manos con agua y [place] antes y despus de [place]ar[place] manager la venda (vendaje). Use desinfectante para manos si no dispone de [place] y [place]. Cambie el vendaje como se lo haya indicado el mdico. No retire los puntos (suturas), la goma para cerrar la piel o las tiras [place]. Es posible que estos cierres cutneos Animal nutritionist en la piel  durante 2 semanas o ms tiempo. Si los bordes de las tiras adhesivas empiezan a despegarse y Therapist, sports, puede recortar los que estn sueltos. No retire las tiras [facility] por completo a menos que el mdico se lo indique. Controle TEFL teacher de insercin del dispositivo todos los das para detectar signos de infeccin. Est atento a los siguientes signos: Enrojecimiento, Land. Lquido o sangre. Calor. Pus o mal olor. Actividad Retome sus actividades normales como se lo haya indicado el mdico. Pregntele al mdico qu actividades son seguras para usted. No levante ningn objeto que pese ms de 10 libras (4.5 kg) o que supere el lmite de peso que le hayan indicado, Nurse, children's que el mdico le diga que puede  [place]. Indicaciones generales [employer] de venta libre y los recetados solamente como se lo haya indicado el mdico. No tome baos de inmersin, no nade ni use el jacuzzi hasta que el mdico lo autorice. Pregntele al mdico si puede ducharse. Thurston Pounds solo le permitan darse baos de [place]. No conduzca durante 24 horas si le administraron un sedante durante el procedimiento. Use un brazalete de alerta mdico en caso de emergencia. Esto permitir que cualquier mdico que lo atienda sepa que tiene un dispositivo. Concurra a todas las visitas de seguimiento como se lo haya indicado el mdico. Esto es importante. Comunquese con un mdico si: No puede purgar el dispositivo con solucin salina como se le indic, o no puede extraer sangre del dispositivo. Tiene fiebre o escalofros. Tiene enrojecimiento, hinchazn o dolor alrededor del lugar de la insercin del dispositivo. Le sale lquido o sangre del Environmental consultant de la insercin del dispositivo. El lugar de la insercin del dispositivo est caliente al tacto. Tiene pus o percibe mal olor que proviene del lugar de la insercin del dispositivo. Solicite ayuda inmediatamente si: Siente falta de aire o dolor en el pecho. Tiene hemorragia proveniente del lugar donde tiene el dispositivo y no puede controlarla. Resumen Cuide el dispositivo como se lo haya indicado el mdico. Tenga la tarjeta informacin del fabricante con usted en todo momento. Cambie el vendaje como se lo haya indicado el mdico. Comunquese con un mdico si tiene fiebre o escalofros o si tiene enrojecimiento, hinchazn o dolor alrededor del Environmental consultant de insercin del dispositivo. Concurra a todas las visitas de seguimiento como se lo haya indicado el mdico. Esta informacin no tiene Marine scientist el consejo del mdico. Asegrese de hacerle al mdico cualquier pregunta que tenga. Document Revised: 05/12/2018 Document Reviewed: 05/12/2018 Elsevier Patient Education  2022  [place] cuidados en el hogar del dispositivo de perfusin implantable Implanted [place] dispositivo de perfusin implantable es un dispositivo que se coloca debajo de la piel. Por lo general, se coloca en el pecho. Puede usarse para suministrar medicamentos intravenosos, tomar muestras de sangre o Optometrist dilisis. Puede tener un dispositivo implantable si: Necesita administrarse un medicamento intravenoso que podra provocar una irritacin en las venas pequeas de las manos o los brazos. Necesita medicamentos por va intravenosa a largo plazo, como antibiticos. Necesita recibir nutricin por va intravenosa durante un largo perodo. Debe someterse a dilisis. Cuando usted tiene un dispositivo de perfusin, Technical sales engineer de las venas de los brazos para estos procedimientos. Puede tener algunas limitaciones ms al usar el dispositivo que si usara otro tipo de vas intravenosas a Barrister's clerk, y es probable que pueda retomar sus actividades normales cuando cure la incisin. Un  dispositivo de perfusin implantable tiene dos partes principales: Depsito. El depsito es la parte en donde se inserta la aguja para Architectural technologist los medicamentos o Merchant navy officer. El depsito es redondo. Luego de colocado, se ve como un rea pequea y elevada debajo de su piel. Catter. El catter es un tubo delgado y flexible que conecta el depsito a una vena. Los medicamentos que se introducen en el depsito, pasan a travs del catter y luego llegan a la vena. Cmo se accede al dispositivo de perfusin? Para acceder al dispositivo: Puede que se le coloque crema anestsica sobre la piel encima del dispositivo. El mdico se colocar Judene Companion y guantes estriles. La piel sobre el dispositivo se limpiar con cuidado con un jabn antisptico y se Psychiatric nurse. Su mdico pellizcar suavemente el dispositivo e insertar una aguja dentro. Su mdico revisar  el retorno de sangre para garantizar que el dispositivo est en la vena y no est obstruido. Si el dispositivo debe tener accesibilidad para un suministro continuo de medicamentos (infusin constante), su mdico colocar un vendaje (venda) claro Dance movement psychotherapist donde se coloc la aguja. El vendaje y la aguja debern cambiarse todas las semanas o segn las indicaciones del mdico. Qu es el purgado? El purgado ayuda a que el dispositivo no se [place]. Siga las indicaciones del mdico acerca de cmo y cundo purgar el dispositivo. Los dispositivos de perfusin generalmente se purgan con una solucin salina o un medicamento llamado heparina. La necesidad de Corporate treasurer de cmo se use el dispositivo: Si se [place] solo eventualmente para suministrar medicamentos o Merchant navy officer, se debe purgar el dispositivo: Antes y despus de la administracin de los medicamentos. Antes y despus de [place] extraccin de [place]. Como parte de una rutina de Theatre manager. Es recomendable que se purgue cada 4 o 6 semanas. Si la infusin es constante, es posible que el dispositivo no necesite ser purgado. Deseche las agujas y las jeringas en un contenedor para desechos destinado a objetos punzantes (recipiente para objetos punzantes). Puede comprar un recipiente para objetos punzantes en [place], o puede hacer uno usted mismo con una botella de plstico rgida y Risk manager. Durante cunto tiempo tendr [employer] dispositivo? El dispositivo puede permanecer implantado por el tiempo que el mdico considere necesario. Cuando llega el momento de retirar el dispositivo, deber someterse a [place]. Esta ciruga ser similar a la Teacher, adult education de la colocacin del dispositivo. Siga estas instrucciones en su casa:  Purgue el dispositivo como se lo haya indicado el mdico. Si debe recibir una infusin [place], siga las indicaciones del mdico acerca de cmo cuidar el lugar donde tiene colocado el  dispositivo. Asegrese de hacer lo siguiente: Lvese las manos con agua y [place] antes de cambiar el vendaje. Usar desinfectante para manos con alcohol si no dispone de [place] y [place]. Cambie el vendaje como se lo haya indicado el [place] bolsas de infusin y los vendajes usados en una bolsa plstica. Tire esa bolsa en el contenedor de basura. Mantenga limpio y seco el vendaje que cubre la [place]. No deje que se humedezca. No use tijeras u objetos punzantes cerca del tubo. Mantenga el tubo sujetado, excepto cuando se use. Controle TEFL teacher donde tiene colocado el dispositivo todos los das para detectar signos de infeccin. Preste atencin a los siguientes signos: Enrojecimiento, Land. Lquido o sangre. Pus o mal olor. Proteja la piel alrededor del lugar donde tiene colocado el dispositivo. Evite usar [employer]  con breteles que rocen o Therapist, art. Proteja la piel alrededor del dispositivo cuando se coloque el cinturn de seguridad. Coloque una almohadilla suave en el pecho, de ser necesario. Dchese o bese como se lo haya indicado el mdico. Se puede mojar el lugar siempre y cuando no est recibiendo activamente una infusin. Retome sus actividades normales segn lo por indicado el mdico. Pregntele al mdico qu actividades son seguras para usted. Utilice un brazalete o lleve consigo una tarjeta de alerta mdica en todo momento. Esto les permitir a los mdicos saber que tiene un dispositivo implantable en caso de Engineer, maintenance (IT). Solicite ayuda de inmediato si: Tiene enrojecimiento, hinchazn o Management consultant donde tiene el dispositivo. Le sale lquido o sangre del lugar donde tiene el dispositivo. Tiene pus o percibe mal olor que proviene del lugar donde tiene el dispositivo. Tiene fiebre. Resumen Por lo general, los dispositivos implantados se colocan en el pecho para un acceso intravenoso (i.v.) a Barrister's clerk. Siga las indicaciones del mdico sobre cmo  purgar el dispositivo y [place] vendas (vendajes). Cuide el rea alrededor de donde tiene colocado el dispositivo, evite la ropa que presione el rea, y preste atencin a los signos de infeccin. Proteja la piel alrededor del dispositivo cuando se coloque el cinturn de seguridad. Coloque una almohadilla suave en el pecho, de ser necesario. Busque ayuda de inmediato si tiene fiebre o enrojecimiento, hinchazn, dolor, secrecin o mal Editor, commissioning donde tiene el dispositivo. Esta informacin no tiene Marine scientist el consejo del mdico. Asegrese de hacerle al mdico cualquier pregunta que tenga. Document Revised: 01/26/2020 Document Reviewed: 01/26/2020 Elsevier Patient Education  2022 [place] general en adultos, cuidados posteriores General Anesthesia, Adult, Care After Esta hoja le brinda informacin sobre cmo cuidarse despus del procedimiento. El mdico tambin podr darle instrucciones ms especficas. Comunquese con el mdico si tiene problemas o preguntas. Qu puedo esperar despus del procedimiento? Luego del procedimiento, son comunes los siguiente efectos secundarios: Dolor o Scientist, research (life sciences) en el lugar de la va intravenosa (i.v.). Nuseas. Vmitos. El dolor de Investment banker, operational. Dificultad para concentrarte. Sentir fro o [employer]. Sensacin de debilidad o cansancio. Somnolencia y Programmer, applications. Malestar y Hydrologist. Estos efectos secundarios pueden afectar partes del cuerpo que no estuvieron involucradas en la ciruga. Siga estas instrucciones en su casa: Durante el perodo de [employer] le haya indicado el mdico:  Descanse. No participe en actividades que impliquen posibles cadas o lesiones. No conduzca ni opere maquinaria. No beba alcohol. No tome somnferos ni medicamentos que causen somnolencia. No tome decisiones trascendentes ni firme documentos importantes. No cuide a nios por su cuenta. Comida y bebida Siga las indicaciones del mdico  respecto de las restricciones de comidas o bebidas. Cuando [employer], comience a comer cantidades pequeas de alimentos que sean blandos y fciles de Publishing copy (livianos), como una tostada. Retome su dieta habitual de forma gradual. Beber suficiente lquido como para mantener la orina de color amarillo plido. Si vomita, rehidrtese tomando agua, jugo o caldo transparente. Instrucciones generales Si tiene apnea del sueo, la [place] y ciertos medicamentos pueden elevar su riesgo de tener problemas respiratorios. Siga las instrucciones del mdico respecto al uso del dispositivo para dormir: Siempre que duerma, Gross siestas que tome en [employer]. Mientras tome analgsicos recetados, medicamentos para dormir o medicamentos que producen somnolencia. Pida a un adulto responsable que se quede con usted durante el tiempo que se le indique. Es importante tener a alguien que lo  ayude a cuidarse hasta que est despierto y consciente. Retome sus actividades normales segn lo indicado por el mdico. Pregntele al mdico qu actividades son seguras para usted. Use los medicamentos de venta libre y los recetados solamente como se lo haya indicado el mdico. Si fuma, no lo haga sin supervisin. Concurra a todas las visitas de seguimiento como se lo haya indicado el mdico. Esto es importante. Comunquese con un mdico si: Tiene nuseas o vmitos que no mejoran con medicamentos. No puede comer ni beber sin vomitar. Su dolor no se alivia con medicamentos. No puede orinar. Tiene una erupcin cutnea. Tiene fiebre. Presenta enrojecimiento alrededor del lugar de la va intravenosa (i.v.) que empeora. Solicite ayuda de inmediato si: Tienes dificultad para respirar. Sientes dolor en el pecho. Observa sangre en la orina o heces, o vomita sangre. Resumen Despus del procedimiento, es comn tener dolor de garganta y nuseas. Tambin es comn sentirse cansado. Pida a un adulto responsable que se quede  con usted durante el tiempo que se le indique. Es importante tener a alguien que lo ayude a cuidarse hasta que est despierto y Press photographer. Cuando [employer], comience a comer cantidades pequeas de alimentos que sean blandos y fciles de Publishing copy (livianos), como una tostada. Retome su dieta habitual de forma gradual. Beber suficiente lquido como para mantener la orina de color amarillo plido. Retome sus actividades normales segn lo indicado por el mdico. Pregntele al mdico qu actividades son seguras para usted. Esta informacin no tiene Marine scientist el consejo del mdico. Asegrese de hacerle al mdico cualquier pregunta que tenga. Document Revised: 02/29/2020 Document Reviewed: 02/29/2020 Elsevier Patient Education  [place].

## 2021-06-19 NOTE — [facility] Notes (Signed)
START ON PATHWAY REGIMEN - Gastroesophageal     A cycle is every 14 days:     Docetaxel      Oxaliplatin      Leucovorin      Fluorouracil   **Always confirm dose/schedule in your pharmacy ordering system**  Patient Characteristics: Gastric, Adenocarcinoma, Preoperative or Nonsurgical Candidate (Clinical Staging), cT3 or Higher or cN+, Surgical Candidate (Up to cT4a), MSS/pMMR or MSI Unknown Histology: Adenocarcinoma Disease Classification: Gastric Therapeutic Status: Preoperative or Nonsurgical Candidate (Clinical Staging) AJCC N Category: cN1 AJCC M Category: cM0 AJCC 8 Stage Grouping: III AJCC T Category: cT3 Microsatellite/Mismatch Repair Status: Unknown Intent of Therapy: Curative Intent, Discussed with Patient

## 2021-06-19 NOTE — Anesthesia Preprocedure Evaluation (Signed)
Anesthesia Evaluation  Patient identified by MRN, date of birth, ID band Patient awake    Reviewed: Allergy & Precautions, NPO status , Patient's Chart, lab work & pertinent test results  Airway Mallampati: II  TM Distance: >3 FB Neck ROM: Full    Dental  (+) Dental Advisory Given, Teeth Intact   Pulmonary neg pulmonary ROS,    Pulmonary exam normal breath sounds clear to auscultation       Cardiovascular Exercise Tolerance: Good Normal cardiovascular exam Rhythm:Regular Rate:Normal     Neuro/Psych PSYCHIATRIC DISORDERS negative neurological ROS     GI/Hepatic PUD, GERD  Medicated and Controlled,(+)     substance abuse (alcohol abuse)  alcohol use,   Endo/Other  negative endocrine ROS  Renal/GU negative Renal ROS     Musculoskeletal negative musculoskeletal ROS (+)   Abdominal   Peds  Hematology  (+) anemia ,   Anesthesia Other Findings   Reproductive/Obstetrics negative OB ROS                             Anesthesia Physical  Anesthesia Plan  ASA: 2  Anesthesia Plan: MAC   Post-op Pain Management:    Induction: Intravenous  PONV Risk Score and Plan: 1 and TIVA, Propofol infusion and Treatment may vary due to age or medical condition  Airway Management Planned: Nasal Cannula and Natural Airway  Additional Equipment:   Intra-op Plan:   Post-operative Plan:   Informed Consent: I have reviewed the patients History and Physical, chart, labs and discussed the procedure including the risks, benefits and alternatives for the proposed anesthesia with the patient or authorized representative who has indicated his/her understanding and acceptance.     Dental advisory given  Plan Discussed with: Surgeon  Anesthesia Plan Comments:         Anesthesia Quick Evaluation

## 2021-06-19 NOTE — Interval H&P Note (Signed)
History and Physical Interval Note:  06/19/2021 10:13 AM  Steven Martinez  has presented today for surgery, with the diagnosis of gastric adenocarcinoma.  The various methods of treatment have been discussed with the patient and family. After consideration of risks, benefits and other options for treatment, the patient has consented to  Procedure(s): UPPER ENDOSCOPIC ULTRASOUND (EUS) RADIAL (N/A) as a surgical intervention.  The patient's history has been reviewed, patient examined, no change in status, stable for surgery.  I have reviewed the patient's chart and labs.  Questions were answered to the patient's satisfaction.     [name]

## 2021-06-19 NOTE — Discharge Instructions (Signed)
YOU HAD AN ENDOSCOPIC PROCEDURE TODAY: Refer to the procedure report and other information in the discharge instructions given to you for any specific questions about what was found during the examination. If this information does not answer your questions, please call [place] office at [phone] to clarify.   YOU SHOULD EXPECT: Some feelings of bloating in the abdomen. Passage of more gas than usual. Walking can help get rid of the air that was put into your GI tract during the procedure and reduce the bloating. If you had a lower endoscopy (such as a colonoscopy or flexible sigmoidoscopy) you may notice spotting of blood in your stool or on the toilet paper. Some abdominal soreness may be present for a day or two, also.  DIET: Your first meal following the procedure should be a light meal and then it is ok to progress to your normal diet. A half-sandwich or bowl of soup is an example of a good first meal. Heavy or fried foods are harder to digest and may make you feel nauseous or bloated. Drink plenty of fluids but you should avoid alcoholic beverages for 24 hours. If you had a esophageal dilation, please see attached instructions for diet.    ACTIVITY: Your care partner should take you home directly after the procedure. You should plan to take it easy, moving slowly for the rest of the day. You can resume normal activity the day after the procedure however YOU SHOULD NOT DRIVE, use power tools, machinery or perform tasks that involve climbing or major physical exertion for 24 hours (because of the sedation medicines used during the test).   SYMPTOMS TO REPORT IMMEDIATELY: A gastroenterologist can be reached at any hour. Please call [phone]  for any of the following symptoms:   Following upper endoscopy (EGD, EUS, ERCP, esophageal dilation) Vomiting of blood or coffee ground material  New, significant abdominal pain  New, significant chest pain or pain under the shoulder blades  Painful or  persistently difficult swallowing  New shortness of breath  Black, tarry-looking or red, bloody stools  FOLLOW UP:  If any biopsies were taken you will be contacted by phone or by letter within the next 1-3 weeks. Call [phone]  if you have not heard about the biopsies in 3 weeks.  Please also call with any specific questions about appointments or follow up tests.  

## 2021-06-20 ENCOUNTER — Encounter (HOSPITAL_COMMUNITY): Payer: Self-pay | Admitting: Gastroenterology

## 2021-06-20 NOTE — Anesthesia Postprocedure Evaluation (Signed)
Anesthesia Post Note  Patient: Steven Martinez  Procedure(s) Performed: UPPER ENDOSCOPIC ULTRASOUND (EUS) RADIAL ESOPHAGOGASTRODUODENOSCOPY (EGD) WITH PROPOFOL     Patient location during evaluation: PACU Anesthesia Type: MAC Level of consciousness: awake and alert Pain management: pain level controlled Vital Signs Assessment: post-procedure vital signs reviewed and stable Respiratory status: spontaneous breathing Cardiovascular status: stable Anesthetic complications: no   No notable events documented.  Last Vitals:  Vitals:   06/19/21 1110 06/19/21 1116  BP: 139/84   Pulse: 87 82  Resp: 20 15  Temp:    SpO2: 99% 99%    Last Pain:  Vitals:   06/19/21 1116  TempSrc:   PainSc: 0-No pain                 Nolon Nations

## 2021-06-23 ENCOUNTER — Ambulatory Visit (HOSPITAL_COMMUNITY)
Admission: RE | Admit: 2021-06-23 | Discharge: 2021-06-23 | Disposition: A | Discharge status: Home / Self Care | Payer: 59 | Attending: General Surgery | Admitting: General Surgery

## 2021-06-23 ENCOUNTER — Ambulatory Visit (HOSPITAL_COMMUNITY): Payer: 59 | Admitting: Anesthesiology

## 2021-06-23 ENCOUNTER — Other Ambulatory Visit: Payer: Self-pay

## 2021-06-23 ENCOUNTER — Encounter (HOSPITAL_COMMUNITY): Payer: Self-pay | Admitting: Hematology

## 2021-06-23 ENCOUNTER — Ambulatory Visit (HOSPITAL_COMMUNITY): Payer: 59

## 2021-06-23 ENCOUNTER — Encounter (HOSPITAL_COMMUNITY)
Admission: RE | Disposition: A | Discharge status: Home / Self Care | Payer: Self-pay | Source: Home / Self Care | Attending: General Surgery

## 2021-06-23 ENCOUNTER — Encounter (HOSPITAL_COMMUNITY): Payer: Self-pay

## 2021-06-23 ENCOUNTER — Encounter (HOSPITAL_COMMUNITY): Payer: Self-pay | Admitting: General Surgery

## 2021-06-23 DIAGNOSIS — Z452 Encounter for adjustment and management of vascular access device: Secondary | ICD-10-CM | POA: Diagnosis not present

## 2021-06-23 DIAGNOSIS — C163 Malignant neoplasm of pyloric antrum: Secondary | ICD-10-CM

## 2021-06-23 DIAGNOSIS — Z95828 Presence of other vascular implants and grafts: Secondary | ICD-10-CM

## 2021-06-23 DIAGNOSIS — C169 Malignant neoplasm of stomach, unspecified: Secondary | ICD-10-CM | POA: Insufficient documentation

## 2021-06-23 DIAGNOSIS — Z79899 Other long term (current) drug therapy: Secondary | ICD-10-CM | POA: Diagnosis not present

## 2021-06-23 DIAGNOSIS — K219 Gastro-esophageal reflux disease without esophagitis: Secondary | ICD-10-CM | POA: Insufficient documentation

## 2021-06-23 HISTORY — PX: PORTACATH PLACEMENT: SHX2246

## 2021-06-23 HISTORY — DX: Presence of other vascular implants and grafts: Z95.828

## 2021-06-23 SURGERY — INSERTION, TUNNELED CENTRAL VENOUS DEVICE, WITH PORT
Anesthesia: General | Site: Chest | Laterality: Left

## 2021-06-23 MED ORDER — LIDOCAINE HCL (CARDIAC) PF 50 MG/5ML IV SOSY
PREFILLED_SYRINGE | INTRAVENOUS | Status: DC | PRN
  Administered 2021-06-23: 60 mg via INTRAVENOUS

## 2021-06-23 MED ORDER — CHLORHEXIDINE GLUCONATE CLOTH 2 % EX PADS: 6.0000 | MEDICATED_PAD | Freq: Once | CUTANEOUS | Status: DC

## 2021-06-23 MED ORDER — MIDAZOLAM HCL 2 MG/2ML IJ SOLN
INTRAMUSCULAR | Status: AC
  Filled 2021-06-23: qty 2

## 2021-06-23 MED ORDER — PROPOFOL 10 MG/ML IV BOLUS
INTRAVENOUS | Status: AC
  Filled 2021-06-23: qty 20

## 2021-06-23 MED ORDER — MIDAZOLAM HCL 5 MG/5ML IJ SOLN
INTRAMUSCULAR | Status: DC | PRN
  Administered 2021-06-23: 2 mg via INTRAVENOUS

## 2021-06-23 MED ORDER — LIDOCAINE HCL (PF) 1 % IJ SOLN
INTRAMUSCULAR | Status: AC
  Filled 2021-06-23: qty 30

## 2021-06-23 MED ORDER — FENTANYL CITRATE (PF) 100 MCG/2ML IJ SOLN
INTRAMUSCULAR | Status: AC
  Filled 2021-06-23: qty 2

## 2021-06-23 MED ORDER — SODIUM CHLORIDE (PF) 0.9 % IJ SOLN
INTRAMUSCULAR | Status: AC
  Filled 2021-06-23: qty 10

## 2021-06-23 MED ORDER — ONDANSETRON HCL 4 MG/2ML IJ SOLN
INTRAMUSCULAR | Status: DC | PRN
  Administered 2021-06-23: 4 mg via INTRAVENOUS

## 2021-06-23 MED ORDER — ORAL CARE MOUTH RINSE: 15.0000 mL | Freq: Once | OROMUCOSAL | Status: DC

## 2021-06-23 MED ORDER — LIDOCAINE HCL (PF) 1 % IJ SOLN
INTRAMUSCULAR | Status: DC | PRN
  Administered 2021-06-23: 8 mL

## 2021-06-23 MED ORDER — PROCHLORPERAZINE MALEATE 10 MG PO TABS: 10.0000 mg | ORAL_TABLET | Freq: Four times a day (QID) | ORAL | 1 refills | Status: DC | PRN

## 2021-06-23 MED ORDER — LACTATED RINGERS IV SOLN: INTRAVENOUS | Status: DC

## 2021-06-23 MED ORDER — FENTANYL CITRATE (PF) 100 MCG/2ML IJ SOLN
INTRAMUSCULAR | Status: DC | PRN
  Administered 2021-06-23 (×2): 50 ug via INTRAVENOUS

## 2021-06-23 MED ORDER — CHLORHEXIDINE GLUCONATE 0.12 % MT SOLN: 15.0000 mL | Freq: Once | OROMUCOSAL | Status: DC

## 2021-06-23 MED ORDER — CEFAZOLIN SODIUM-DEXTROSE 2-4 GM/100ML-% IV SOLN
2.0000 g | INTRAVENOUS | Status: AC
  Administered 2021-06-23: 2 g via INTRAVENOUS

## 2021-06-23 MED ORDER — KETOROLAC TROMETHAMINE 30 MG/ML IJ SOLN
30.0000 mg | Freq: Once | INTRAMUSCULAR | Status: AC
  Administered 2021-06-23: 30 mg via INTRAVENOUS
  Filled 2021-06-23: qty 1

## 2021-06-23 MED ORDER — HYDROCODONE-ACETAMINOPHEN 5-325 MG PO TABS: 1.0000 | ORAL_TABLET | ORAL | 0 refills | Status: DC | PRN

## 2021-06-23 MED ORDER — HEPARIN SOD (PORK) LOCK FLUSH 100 UNIT/ML IV SOLN
INTRAVENOUS | Status: AC
  Filled 2021-06-23: qty 5

## 2021-06-23 MED ORDER — HEPARIN SOD (PORK) LOCK FLUSH 100 UNIT/ML IV SOLN
INTRAVENOUS | Status: DC | PRN
  Administered 2021-06-23: 500 [IU] via INTRAVENOUS

## 2021-06-23 MED ORDER — PROPOFOL 500 MG/50ML IV EMUL
INTRAVENOUS | Status: DC | PRN
  Administered 2021-06-23: 50 ug/kg/min via INTRAVENOUS
  Administered 2021-06-23: 100 ug/kg/min via INTRAVENOUS

## 2021-06-23 MED ORDER — SODIUM CHLORIDE (PF) 0.9 % IJ SOLN
INTRAMUSCULAR | Status: DC | PRN
  Administered 2021-06-23: 500 mL via INTRAVENOUS

## 2021-06-23 MED ORDER — CEFAZOLIN SODIUM-DEXTROSE 2-4 GM/100ML-% IV SOLN
INTRAVENOUS | Status: AC
  Filled 2021-06-23: qty 100

## 2021-06-23 MED ORDER — PROPOFOL 10 MG/ML IV BOLUS
INTRAVENOUS | Status: DC | PRN
  Administered 2021-06-23: 20 mg via INTRAVENOUS
  Administered 2021-06-23: 50 mg via INTRAVENOUS
  Administered 2021-06-23: 60 mg via INTRAVENOUS

## 2021-06-23 MED ORDER — LIDOCAINE-PRILOCAINE 2.5-2.5 % EX CREA: TOPICAL_CREAM | CUTANEOUS | 3 refills | Status: DC

## 2021-06-23 SURGICAL SUPPLY — 29 items
ADH SKN CLS APL DERMABOND .7 (GAUZE/BANDAGES/DRESSINGS) ×1
APL PRP STRL LF ISPRP CHG 10.5 (MISCELLANEOUS) ×1
APPLICATOR CHLORAPREP 10.5 ORG (MISCELLANEOUS) ×2 IMPLANT
BAG DECANTER FOR FLEXI CONT (MISCELLANEOUS) ×2 IMPLANT
CLOTH BEACON ORANGE TIMEOUT ST (SAFETY) ×2 IMPLANT
COVER LIGHT HANDLE STERIS (MISCELLANEOUS) ×4 IMPLANT
DECANTER SPIKE VIAL GLASS SM (MISCELLANEOUS) ×2 IMPLANT
DERMABOND ADVANCED (GAUZE/BANDAGES/DRESSINGS) ×1
DERMABOND ADVANCED .7 DNX12 (GAUZE/BANDAGES/DRESSINGS) ×1 IMPLANT
DRAPE C-ARM FOLDED MOBILE STRL (DRAPES) ×2 IMPLANT
ELECT REM PT RETURN 9FT ADLT (ELECTROSURGICAL) ×2
ELECTRODE REM PT RTRN 9FT ADLT (ELECTROSURGICAL) ×1 IMPLANT
GLOVE SURG POLYISO LF SZ7.5 (GLOVE) ×2 IMPLANT
GLOVE SURG UNDER POLY LF SZ7 (GLOVE) ×4 IMPLANT
GOWN STRL REUS W/TWL LRG LVL3 (GOWN DISPOSABLE) ×4 IMPLANT
IV NS 500ML (IV SOLUTION) ×2
IV NS 500ML BAXH (IV SOLUTION) ×1 IMPLANT
KIT PORT POWER 8FR ISP MRI (Port) ×2 IMPLANT
KIT TURNOVER KIT A (KITS) ×2 IMPLANT
NEEDLE HYPO 25X1 1.5 SAFETY (NEEDLE) ×2 IMPLANT
PACK MINOR (CUSTOM PROCEDURE TRAY) ×2 IMPLANT
PAD ARMBOARD 7.5X6 YLW CONV (MISCELLANEOUS) ×2 IMPLANT
SET BASIN LINEN APH (SET/KITS/TRAYS/PACK) ×2 IMPLANT
SHEATH COOK PEEL AWAY SET 8F (SHEATH) ×2 IMPLANT
SUT MNCRL AB 4-0 PS2 18 (SUTURE) ×2 IMPLANT
SUT VIC AB 3-0 SH 27 (SUTURE) ×2
SUT VIC AB 3-0 SH 27X BRD (SUTURE) ×1 IMPLANT
SYR 5ML LL (SYRINGE) ×2 IMPLANT
SYR CONTROL 10ML LL (SYRINGE) ×2 IMPLANT

## 2021-06-23 NOTE — Progress Notes (Signed)
Steven Martinez, [place] [address]   CLINIC:  Medical Oncology/Hematology  PCP:  [facility] [address] / [place] [id number] [phone]   REASON FOR VISIT:  Follow-up for adenocarcinoma of the stomach  PRIOR THERAPY: none  NGS Results: not done  CURRENT THERAPY: Gastroesophageal FLOT every 2 weeks for 4 cycles  BRIEF ONCOLOGIC HISTORY:  Oncology History  Gastric cancer ([place])  06/19/2021 Initial Diagnosis   Gastric cancer ([place])   06/25/2021 -  Chemotherapy   Patient is on Treatment Plan : GASTROESOPHAGEAL FLOT q14d X 4 cycles       CANCER STAGING: Cancer Staging Gastric cancer ([place]) Staging form: Stomach, AJCC 8th Edition - Clinical stage from 06/19/2021: Stage III (cT3, cN1, cM0) - Unsigned   INTERVAL HISTORY:  [name], a 30 y.o. male, returns for routine follow-up of his adenocarcinoma of the stomach. [place] was last seen on 06/02/2021.   Today he reports feeling good, and he is accompanied by his wife. He is currently drinking 3 Boost along with 3 meals daily. He denies n/v/d/c. He reports stable abdominal pain.    REVIEW OF SYSTEMS:  Review of Systems  Constitutional:  Negative for appetite change and fatigue (75%).  Gastrointestinal:  Positive for abdominal pain (stable). Negative for constipation, diarrhea, nausea and vomiting.  Neurological:  Positive for headaches.  Psychiatric/Behavioral:  Positive for depression. The patient is nervous/anxious.   All other systems reviewed and are negative.  PAST MEDICAL/SURGICAL HISTORY:  Past Medical History:  Diagnosis Date   Acute upper GI bleed 10/13/2020   gastric ulcer with visible vessel   Alcohol abuse 10/13/2020   Quit Jan 2022   GERD (gastroesophageal reflux disease)    Port-A-Cath in place 06/23/2021   Positive H. pylori test 09/2020   H pylori IgG + Jan 2022 s/p treatment with Prevpac. EGD March 2022 with gastric biopsy negative  for H. pylori.    Past Surgical History:  Procedure Laterality Date   BIOPSY  12/03/2020   Procedure: BIOPSY;  Surgeon: Eloise Harman, DO;  [facility];  Service: Endoscopy;;   BIOPSY  05/12/2021   Procedure: BIOPSY;  Surgeon: Eloise Harman, DO;  [facility];  Service: Endoscopy;;   ESOPHAGOGASTRODUODENOSCOPY (EGD) WITH PROPOFOL N/A 10/14/2020    Surgeon: Eloise Harman, DO;  one non-bleeding, non-obstructing gastric ulcer with visible vessel s/p bipolar cautery, normal examined esophagus and duodenum.    ESOPHAGOGASTRODUODENOSCOPY (EGD) WITH PROPOFOL N/A 12/03/2020    Surgeon: Eloise Harman, DO;   nonobstructing, nonbleeding gastric ulcer with clean base s/p biopsied, gastritis biopsied, normal examined duodenum.  Pathology with granulation tissue consistent with ulcer, mild chronic gastritis with focal intestinal metaplasia, H. pylori negative.  Repeat in 3 months.   ESOPHAGOGASTRODUODENOSCOPY (EGD) WITH PROPOFOL N/A 05/12/2021   Procedure: ESOPHAGOGASTRODUODENOSCOPY (EGD) WITH PROPOFOL;  Surgeon: Eloise Harman, DO;  [facility];  Service: Endoscopy;  Laterality: N/A;  10:30am   ESOPHAGOGASTRODUODENOSCOPY (EGD) WITH PROPOFOL N/A 06/19/2021   Procedure: ESOPHAGOGASTRODUODENOSCOPY (EGD) WITH PROPOFOL;  Surgeon: [name], MD;  Location: [facility];  Service: Endoscopy;  Laterality: N/A;   EUS N/A 06/19/2021   Procedure: UPPER ENDOSCOPIC ULTRASOUND (EUS) RADIAL;  Surgeon: [name], MD;  Location: [facility];  Service: Endoscopy;  Laterality: N/A;   PORTACATH PLACEMENT Left 06/23/2021   Procedure: INSERTION PORT-A-CATH;  Surgeon: Aviva Signs, MD;  Location: [facility];  Service: General;  Laterality: Left;    SOCIAL HISTORY:  Social History   Socioeconomic History   Marital status: Married    Spouse name: Not on file   Number of children: Not on file   Years of education: Not on file   Highest education level: Not on file   Occupational History   Not on file  Tobacco Use   Smoking status: Never   Smokeless tobacco: Never  Vaping Use   Vaping Use: Never used  Substance and Sexual Activity   Alcohol use: Not Currently    Alcohol/week: 57.0 - 58.0 standard drinks    Types: 54 Cans of beer, 3 - 4 Shots of liquor per week    Comment: Quit January 2022. Used to drink sixpack daily and 12-15 beers on Saturdays and Sundays.    Drug use: Never   Sexual activity: Yes  Other Topics Concern   Not on file  Social History Narrative   Not on file   Social Determinants of Health   Financial Resource Strain: Low Risk    Difficulty of Paying Living Expenses: Not hard at all  Food Insecurity: No Food Insecurity   Worried About Charity fundraiser in the Last Year: Never true   [place] in the Last Year: Never true  Transportation Needs: No Transportation Needs   Lack of Transportation (Medical): No   Lack of Transportation (Non-Medical): No  Physical Activity: Sufficiently Active   Days of Exercise per Week: 5 days   Minutes of Exercise per Session: 60 min  Stress: No Stress Concern Present   Feeling of Stress : Not at all  Social Connections: Moderately Isolated   Frequency of Communication with Friends and Family: More than three times a week   Frequency of Social Gatherings with Friends and Family: More than three times a week   Attends Religious Services: Never   Marine scientist or Organizations: No   Attends Music therapist: Never   Marital Status: Married  Human resources officer Violence: Not At Risk   Fear of Current or Ex-Partner: No   Emotionally Abused: No   Physically Abused: No   Sexually Abused: No    FAMILY HISTORY:  Family History  Problem Relation Age of Onset   Diabetes Mellitus II Mother    Hypertension Mother    Colon cancer Neg Hx    Colon polyps Neg Hx     CURRENT MEDICATIONS:  Current Outpatient Medications  Medication Sig Dispense Refill   [START ON  06/25/2021] DOCETAXEL IV Inject into the vein every 14 (fourteen) days.     [START ON 06/25/2021] fluorouracil CALGB 09983 2,400 mg/m2 in sodium chloride 0.9 % 150 mL Inject 2,600 mg/m2 into the vein over 24 hr.     [START ON 06/25/2021] FLUOROURACIL IV Inject into the vein every 14 (fourteen) days.     lidocaine-prilocaine (EMLA) cream Apply a small amount to port a cath site and cover with plastic wrap 1 hour prior to chemotherapy appointments 30 g 3   Multiple Vitamin (MULTIVITAMIN WITH MINERALS) TABS tablet Take 1 tablet by mouth in the morning.     [START ON 06/25/2021] OXALIPLATIN IV Inject into the vein every 14 (fourteen) days.     pantoprazole (PROTONIX) 40 MG tablet 1 tablet     sucralfate (CARAFATE) 1 GM/10ML suspension Take 10 mLs (1 g total) by mouth 4 (four) times daily -  with meals and at bedtime. 420 mL 0   HYDROcodone-acetaminophen (NORCO)  5-325 MG tablet Take 1 tablet by mouth every 4 (four) hours as needed for moderate pain. (Patient not taking: Reported on 06/24/2021) 20 tablet 0   prochlorperazine (COMPAZINE) 10 MG tablet Take 1 tablet (10 mg total) by mouth every 6 (six) hours as needed (Nausea or vomiting). (Patient not taking: Reported on 06/24/2021) 30 tablet 1   No current facility-administered medications for this visit.    ALLERGIES:  Not on File  PHYSICAL EXAM:  Performance status (ECOG): 0 - Asymptomatic  Vitals:   06/24/21 1342  BP: 131/82  Pulse: 92  Resp: 18  Temp: 99.3 F (37.4 C)  SpO2: 98%   Wt Readings from Last 3 Encounters:  06/24/21 158 lb 12.8 oz (72 kg)  06/23/21 156 lb 15.5 oz (71.2 kg)  06/19/21 157 lb (71.2 kg)   Physical Exam Vitals reviewed.  Constitutional:      Appearance: Normal appearance.  Neurological:     General: No focal deficit present.     Mental Status: He is alert and oriented to person, place, and time.  Psychiatric:        Mood and Affect: Mood normal.        Behavior: Behavior normal.     LABORATORY DATA:  I have  reviewed the labs as listed.  CBC Latest Ref Rng & Units 06/19/2021 04/14/2021 02/03/2021  WBC 4.0 - 10.5 K/uL 4.9 4.6 4.0  Hemoglobin 13.0 - 17.0 g/dL 10.3(L) 14.9 13.8  Hematocrit 39.0 - 52.0 % 32.0(L) 45.4 43.0  Platelets 150 - 400 K/uL 275 268 210   CMP Latest Ref Rng & Units 06/19/2021 05/07/2021 12/02/2020  Glucose 70 - 99 mg/dL 116(H) 86 111(H)  BUN 6 - 20 mg/dL _0 Creatinine 0.61 - 1.24 mg/dL 0.71 0.84 0.83  Sodium 135 - 145 mmol/L 137 137 139  Potassium 3.5 - 5.1 mmol/L 3.7 3.8 3.7  Chloride 98 - 111 mmol/L 105 104 106  CO2 22 - 32 mmol/L _1 Calcium 8.9 - 10.3 mg/dL 8.6(L) 8.8(L) 8.9  Total Protein 6.5 - 8.1 g/dL 7.0 7.1 7.2  Total Bilirubin 0.3 - 1.2 mg/dL 0.3 0.6 0.4  Alkaline Phos 38 - 126 U/L 78 75 62  AST 15 - 41 U/L _2 ALT 0 - 44 U/L _3 DIAGNOSTIC IMAGING:  I have independently reviewed the scans and discussed with the patient. NM PET Image Initial (PI) Skull Base To Thigh (F-18 FDG)  Result Date: 05/31/2021 CLINICAL DATA:  Initial treatment strategy for gastric adenocarcinoma. EXAM: NUCLEAR MEDICINE PET SKULL BASE TO THIGH TECHNIQUE: 7.54 mCi F-18 FDG was injected intravenously. Full-ring PET imaging was performed from the skull base to thigh after the radiotracer. CT data was obtained and used for attenuation correction and anatomic localization. Fasting blood glucose: 124 mg/dl COMPARISON:  05/14/2021 FINDINGS: Mediastinal blood pool activity: SUV max 1.14 Liver activity: SUV max NA NECK: No hypermetabolic lymph nodes in the neck. Incidental CT findings: Asymmetric opacification of the left maxillary sinus CHEST: No hypermetabolic mediastinal or hilar nodes. No suspicious pulmonary nodules on the CT scan. Incidental CT findings: 3 mm nodule identified in the right middle lobe, image 139/2. This is too small to characterize by PET-CT ABDOMEN/PELVIS: No abnormal hypermetabolic activity within the liver, pancreas, adrenal glands, or spleen. Mass  within the distal stomach measures approximately 4.9 x 2.8 cm with SUV max of 5.32. -Small gastrohepatic ligament lymph node (posterior to the antrum) measures 0.7 cm  with low-level FDG uptake, SUV max is 1.24, image 221/7. -lymph node within the perigastric fat (anterior to the pylorus) is stable measuring 5 mm and has an SUV max of 0.3, image 146/3. -portacaval node measures 1.1 cm and has an SUV max of 1.0, image 153/3. Incidental CT findings: none SKELETON: No focal hypermetabolic activity to suggest skeletal metastasis. Incidental CT findings: none IMPRESSION: 1. Mass within the gastric antrum is FDG avid within SUV max of 5.32 compatible with primary gastric adenocarcinoma. 2. No definite evidence for FDG avid nodal metastasis or distant metastatic disease. 3. There is only low-level FDG uptake associated with the previously described prominent upper abdominal lymph nodes, as described above. Electronically Signed   By: Kerby Moors M.D.   On: 05/31/2021 15:30   DG Chest Port 1 View  Result Date: 06/23/2021 CLINICAL DATA:  Post Port-A-Cath placement EXAM: PORTABLE CHEST 1 VIEW COMPARISON:  01/23/2020 FINDINGS: Interval placement of left chest port with catheter tip overlying the SVC. Normal cardiac and mediastinal contours. No focal pulmonary opacity. No pleural effusion pneumothorax. No acute osseous abnormality. IMPRESSION: 1. Status post placement of a left chest port with catheter tip overlying the SVC. 2. No active cardiopulmonary disease. Electronically Signed   By: Merilyn Baba M.D.   On: 06/23/2021 13:19   DG C-Arm 1-60 Min-No Report  Result Date: 06/23/2021 Fluoroscopy was utilized by the requesting physician.  No radiographic interpretation.     ASSESSMENT:  1.  Gastric antral adenocarcinoma: - EGD on 05/12/2021 gastric antral mass with large cratered ulcer.  Normal duodenal bulb and first part of the duodenum. - Biopsy of the antrum ulcer consistent with adenocarcinoma, at least  intramucosal.  Depth of invasion cannot be judged as the biopsy was superficial. - CT CAP on 05/14/2021 with gastric antral soft tissue thickening/mass measuring 3.7 x 3 cm.  1.2 cm portacaval lymph node.  Node within the perigastric fat anterior to the pylorus measures 5 mm.  No evidence of distant metastatic disease in the abdomen or pelvis.  Isolated 4 mm right middle lobe lung nodule most likely incidental/benign. - 13 pound weight loss in the last 3 months. - PET scan on 05/29/2021 showed mass in the gastric antrum FDG avid with SUV 5.32.  No definite evidence of FDG avid nodal metastasis or distant metastatic disease.  There is only low-level FDG uptake associated with previously described prominent upper abdominal lymph nodes. - EGD/EUS on 06/19/2021-uT3N1 4 cm cratered noncircumferential distal gastric adenocarcinoma.  There is one suspicious 9 mm perigastric lymph node nearby the primary gastric mass.  2.  Social/family history: - He is married and seen with his wife today. - He does Architect work. - He quit smoking cigarettes 13 years ago.  Quit drinking alcohol in March 2022. - No family history of malignancies.   PLAN:   1.  Stage III (uT3 N1) gastric adenocarcinoma: - PET scan results were discussed. - We reviewed results of EGD/EUS in detail.  Unfortunately biopsy was not done to send for MSI testing. - We reviewed neoadjuvant chemotherapy followed by surgical resection. - I have recommended FLOT regimen 48 cycles prior to surgical resection. - He already has port placed. - We discussed schedule of chemotherapy and side effects in detail. - We will likely start treatment tomorrow.  I plan to see him back in 2 weeks prior to cycle 3.  2.  Weight loss: - He had 13 pound weight loss in the last 3 months. - He has left  upper quadrant pain around 2 AM in the morning which subsides after eating. - He is eating 3 times daily and drinking boost 3 times daily.  His weight improved by  1 pound since last visit.  Pain is stable.  Denies any nausea vomiting.    Orders placed this encounter:  No orders of the defined types were placed in this encounter.  Total time spent is 40 minutes with more than 50% of the time spent face-to-face discussing scan results, procedure results, treatment plan, side effects, counseling and coordination of care.  Derek Jack, MD [place] [phone]   I, Thana Ates, am acting as a scribe for Dr. Derek Jack.  I, Derek Jack MD, have reviewed the above documentation for accuracy and completeness, and I agree with the above.

## 2021-06-23 NOTE — Anesthesia Procedure Notes (Addendum)
Date/Time: 06/23/2021 11:52 AM Performed by: Vista Deck, CRNA Pre-anesthesia Checklist: Patient identified, Emergency Drugs available, Suction available, Timeout performed and Patient being monitored Patient Re-evaluated:Patient Re-evaluated prior to induction Oxygen Delivery Method: Nasal Cannula

## 2021-06-23 NOTE — Interval H&P Note (Signed)
History and Physical Interval Note:  06/23/2021 11:11 AM  Steven Martinez  has presented today for surgery, with the diagnosis of Gastric carcinoma.  The various methods of treatment have been discussed with the patient and family. After consideration of risks, benefits and other options for treatment, the patient has consented to  Procedure(s): INSERTION PORT-A-CATH (Left) as a surgical intervention.  The patient's history has been reviewed, patient examined, no change in status, stable for surgery.  I have reviewed the patient's chart and labs.  Questions were answered to the patient's satisfaction.     Aviva Signs

## 2021-06-23 NOTE — Transfer of Care (Signed)
Immediate Anesthesia Transfer of Care Note  Patient: Steven Martinez  Procedure(s) Performed: INSERTION PORT-A-CATH (Left: Chest)  Patient Location: PACU  Anesthesia Type:General  Level of Consciousness: awake and patient cooperative  Airway & Oxygen Therapy: Patient Spontanous Breathing and Patient connected to nasal cannula oxygen  Post-op Assessment: Report given to RN and Post -op Vital signs reviewed and stable  Post vital signs: Reviewed and stable  Last Vitals:  Vitals Value Taken Time  BP 125/92 06/23/21 1245  Temp    Pulse 72 06/23/21 1246  Resp 14 06/23/21 1246  SpO2 100 % 06/23/21 1246  Vitals shown include unvalidated device data. SEE VITAL SIGN FLOW SHEET Last Pain:  Vitals:   06/23/21 1128  TempSrc: Oral  PainSc: 0-No pain      Patients Stated Pain Goal: 5 (89/37/34 2876)  Complications: No notable events documented.

## 2021-06-23 NOTE — Anesthesia Postprocedure Evaluation (Signed)
Anesthesia Post Note  Patient: Steven Martinez  Procedure(s) Performed: INSERTION PORT-A-CATH (Left: Chest)  Patient location during evaluation: Phase II Anesthesia Type: General Level of consciousness: awake Pain management: pain level controlled Vital Signs Assessment: post-procedure vital signs reviewed and stable Respiratory status: spontaneous breathing and respiratory function stable Cardiovascular status: blood pressure returned to baseline and stable Postop Assessment: no headache and no apparent nausea or vomiting Anesthetic complications: no Comments: Late entry   No notable events documented.   Last Vitals:  Vitals:   06/23/21 1330 06/23/21 1351  BP: (!) 139/94 (!) 149/89  Pulse: 75 78  Resp: 19 20  Temp:  36.7 C  SpO2: 100% 100%    Last Pain:  Vitals:   06/23/21 1351  TempSrc: Oral  PainSc: 0-No pain                 Louann Sjogren

## 2021-06-23 NOTE — Progress Notes (Signed)
The following biosimilar Udenyca ([id number]) has been selected for use in this patient.   Henreitta Leber, PharmD 06/23/21

## 2021-06-23 NOTE — Patient Instructions (Addendum)
[facility] Chemotherapy Teaching   You are diagnosed with gastric antral adenocarcinoma (cancer).  You will be treated in the clinic every 2 weeks for four cycles with a combination of chemotherapy drugs.  Those drugs are docetaxel (Taxotere), oxaliplatin, leucovorin, and fluorouracil.  The intent of treatment is cure.  You will see the doctor regularly throughout treatment.  We will obtain blood work from you prior to every treatment and monitor your results to make sure it is safe to give your treatment. The doctor monitors your response to treatment by the way you are feeling, your blood work, and by obtaining scans periodically.  There will be wait times while you are here for treatment.  It will take about 30 minutes to 1 hour for your lab work to result.  Then there will be wait times while pharmacy mixes your medications.     Medications you will receive in the clinic prior to your chemotherapy medications:  Aloxi:  ALOXI is used in adults to help prevent nausea and vomiting that happens with certain chemotherapy drugs.  Aloxi is a long acting medication, and will remain in your system for about two days.   Dexamethasone:  This is a steroid given prior to chemotherapy to help prevent allergic reactions; it may also help prevent and control nausea and diarrhea.     Docetaxel (Taxotere)   About This Drug   Docetaxel is used to treat cancer. It is given in the vein (IV). It will take 1 hour to infuse.  The first infusion will take longer (about 1.5 hours) due to the fact that we start this drug at a very slow rate and gradually increase the rate of infusion until the maximum rate is reached.  This is done to monitor for infusion reactions, and to make sure you will tolerated this drug without adverse effects.  If you tolerate the first infusion, all subsequent infusions will be started at the normal rate and given over 1 hour.  Possible Side Effects    Bone marrow suppression.  This is a decrease in the number of white blood cells, red blood cells, and platelets. This may raise your risk of infection, make you tired and weak, and raise your risk of bleeding.    Neutropenic fever. A type of fever that can develop when you have a very low number of white blood cells which can be life-threatening.    Soreness of the mouth and throat. You may have red areas, white patches, or sores that hurt.    Nausea and vomiting (throwing up)    Constipation (not able to move bowels)    Diarrhea (loose bowel movements)    Infections    Fluid retention - swelling of the hands, feet, or any other part of the body. Fluid may build-up around your lungs and/or heart.    Changes in the way food and drinks taste    Effects on the nerves are called peripheral neuropathy. You may feel numbness, tingling, or pain in your hands and feet. It may be hard for you to button your clothes, open jars, or walk as usual. The effect on the nerves may get worse with more doses of the drug. These effects get better in some people after the drug is stopped but it does not get better in all people.    Decreased appetite (decreased hunger)    Weakness    Pain    Muscle pain/aching    Trouble breathing  Changes in your nail color, you may have nail loss and/or brittle nail    Hair loss. Hair loss is often temporary, although there have been cases of permanent hair loss reported. Hair loss may happen suddenly or gradually. If you lose hair, you may lose it from your head, face, armpits, pubic area, chest, and/or legs. You may also notice your hair getting thin.    Allergic skin reaction. You may develop blisters on your skin that are filled with fluid or a severe red rash all over your body that may be painful.    Allergic reactions, including anaphylaxis are rare but may happen in some patients. Signs of allergic reaction to this drug may be swelling of the face, feeling like your tongue or  throat are swelling, trouble breathing, rash, itching, fever, chills, feeling dizzy, and/or feeling that your heart is beating in a fast or not normal way. If this happens, do not take another dose of this drug. You should get urgent medical treatment.   Note: Not all possible side effects are included above.   Warnings and Precautions    Severe bone marrow suppression, including febrile neutropenia which can be life-threatening    Severe allergic reactions, including anaphylaxis which can be life-threatening  Colitis, which is swelling (inflammation) in the colon - symptoms are diarrhea (loose bowel movements), stomach cramping, and sometimes blood in the bowel movements  Severe skin reactions, including redness, swelling, or peeling of skin  Severe swelling in the eye or other changes in eyesight    Severe fluid retention    If you have a history of abnormal liver function, receive high doses of docetaxel, or have a history of lung cancer and have received treatment with a platinum (type of chemotherapy medication), you have an increased risk of death.    Severe weakness    This drug may raise your risk of getting a second cancer such as leukemia, lymphoma, myelodysplastic syndrome, and kidney cancer.    Severe peripheral neuropathy    This drug contains alcohol and may affect your central nervous system. The central nervous system is made up of your brain and spinal cord. You may feel dizzy and very sleepy.    Tumor lysis syndrome: This drug may act on the cancer cells very quickly. This may affect how your kidneys work. Note: Some of the side effects above are very rare. If you have concerns and/or questions, please discuss them with your medical team. Important Information    This drug may be present in the saliva, tears, sweat, urine, stool, vomit, semen, and vaginal secretions. Talk to your doctor and/or your nurse about the necessary precautions to take during this time.    This  drug may impair your ability to drive or use machinery. Use caution and tell your nurse or doctor if you feel dizzy, very sleepy, and/or experience low blood pressure. Treating Side Effects    Manage tiredness by pacing your activities for the day.    Be sure to include periods of rest between energy-draining activities.    Get regular exercise. If you feel too tired to exercise vigorously, try taking a short walk.     To decrease the risk of infection, wash your hands regularly.    Avoid close contact with people who have a cold, the flu, or other infections.    Take your temperature as your doctor or nurse tells you, and whenever you feel like you may have a fever.  To help decrease the risk of bleeding, use a soft toothbrush. Check with your nurse before using dental floss.    Be very careful when using knives or tools.    Use an electric shaver instead of a razor.    Mouth care is very important and will help food taste better and improve your appetite. Your mouth care should consist of routine, gentle cleaning of your teeth or dentures and rinsing your mouth with a mixture of 1/2 teaspoon of salt in 8 ounces of water or 1/2 teaspoon of baking soda in 8 ounces of water. This should be done at least after each meal and at bedtime.    If you have mouth sores, avoid mouthwash that has alcohol. Also avoid alcohol and smoking because they can bother your mouth and throat.    Taking good care of your mouth may help food taste better and improve your appetite    Drink plenty of fluids (a minimum of eight glasses per day is recommended).    If you throw up or have diarrhea, you should drink more fluids so that you do not become dehydrated (lack of water in the body from losing too much fluid).    If you have diarrhea, eat low-fiber foods that are high in protein and calories and avoid foods that can irritate your digestive tracts or lead to cramping.    If you are not able to move your  bowels, check with your doctor or nurse before you use enemas, laxatives, or suppositories.    Ask your doctor or nurse about medicines that are available to help stop or lessen constipation and/ or diarrhea.    To help with nausea and vomiting, eat small, frequent meals instead of three large meals a day. Choose foods and drinks that are at room temperature. Ask your nurse or doctor about other helpful tips and medicine that is available to help stop or lessen these symptoms.    To help with decreased appetite, eat foods high in calories and protein, such as meat, poultry, fish, dry beans, tofu, eggs, nuts, milk, yogurt, cheese, ice cream, pudding, and nutritional supplements.    Consider using sauces and spices to increase taste. Daily exercise, with your doctor's approval, may increase your appetite.    Keeping your pain under control is important to your well-being. Please tell your doctor or nurse if you are experiencing pain.    If you get a rash do not put anything on it unless your doctor or nurse says you may. Keep the area around the rash clean and dry. Ask your doctor for medicine if your rash bothers you.    Keeping your nails moisturized may help with brittleness.    To help with hair loss, wash with a mild shampoo and avoid washing your hair every day. Avoid coloring your hair.    Avoid rubbing your scalp, pat your hair or scalp dry.    Limit your use of hair spray, electric curlers, blow dryers, and curling irons.    If you are interested in getting a wig, talk to your nurse and they can help you get in touch with programs in your local area.    If you have numbness and tingling in your hands and feet, be careful when cooking, walking, and handling sharp objects and hot liquids.   Food and Drug Interactions    This drug may interact with grapefruit and grapefruit juice. Talk to your doctor as this could make side  effects worse.    This drug may interact with other  medicines. Tell your doctor and pharmacist about all the prescription and over-the-counter medicines and dietary supplements (vitamins, minerals, herbs, and others) that you are taking at this time. Also, check with your doctor or pharmacist before starting any new prescription or over-the-counter medicines, or dietary supplements to make sure that there are no interactions.    This drug may interact with St. John's Wort and may lower the levels of the drug in your body, which can make it less effective. When to Call the Doctor Call your doctor or nurse if you have any of these symptoms and/or any new or unusual symptoms:    Fever of 100.4 F (38 C) or higher    Chills    Blurred vision or other changes in eyesight, excessive tearing    Symptoms of being drunk, confusion, or being very sleepy    Feeling dizzy or lightheaded  Tiredness and/or weakness that interferes with your daily activities  Easy bruising or bleeding    Wheezing and/or trouble breathing    Chest pain    Pain in your mouth or throat that makes it hard to eat or drink    Nausea that stops you from eating or drinking and/or is not relieved by prescribed medicines    Throwing up more than 3 times a day    Lasting loss of appetite or rapid weight loss of five pounds in a week    Diarrhea, 4 times in one day or diarrhea with lack of strength or a feeling of being dizzy    No bowel movement in 3 days or when you feel uncomfortable    Pain in your abdomen that does not go away    Blood in your stool    Swelling of the hands, feet, or any other part of the body    Weight gain of 5 pounds in one week (fluid retention)    New rash and/or itching  Rash that is not relieved by prescribed medicines    Signs of inflammation/infection (redness, swelling, pain) of the tissue around your nails.    Signs of allergic reaction: swelling of the face, feeling like your tongue or throat are swelling, trouble breathing, rash,  itching, fever, chills, feeling dizzy, and/or feeling that your heart is beating in a fast or not normal way. If this happens, call 911 for emergency care.    Flu-like symptoms: fever, headache, muscle and joint aches, and fatigue (low energy, feeling weak)    Signs of possible liver problems: dark urine, pale bowel movements, pain in your abdomen, feeling very tired and weak, unusual itching, or yellowing of the eyes or skin    Numbness, tingling, or pain in your hands and feet    Signs of tumor lysis: confusion or agitation, decreased urine, nausea/vomiting, diarrhea, muscle cramping, numbness and/or tingling, seizures.    General pain that does not go away or is not relieved by prescribed medicine    If you think you may be pregnant or have impregnated your partner   Reproduction Warnings    Pregnancy warning: This drug can have harmful effects on the unborn baby. Women of childbearing potential should use effective methods of birth control during your cancer treatment and for 6 months after stopping treatment. Men with male partners of childbearing potential should use effective methods of birth control during your cancer treatment and for 3 months after stopping treatment. Let your doctor know right away  if you think you may be pregnant or may have impregnated your partner.    Breastfeeding warning: Women should not breastfeed during treatment and for 1 week after stopping treatment because this drug could enter the breast milk and cause harm to a breastfeeding baby.    Fertility warning: In men, this drug may affect your ability to have children in the future. Talk with your doctor or nurse if you plan to have children. Ask for information on sperm banking.    Oxaliplatin (Eloxatin)  About This Drug  Oxaliplatin is used to treat cancer. It is given in the vein (IV).  It takes two hours to infuse.  Possible Side Effects   Bone marrow suppression. This is a decrease in the  number of white blood cells, red blood cells, and platelets. This may raise your risk of infection, make you tired and weak (fatigue), and raise your risk of bleeding.   Tiredness   Soreness of the mouth and throat. You may have red areas, white patches, or sores that hurt.   Nausea and vomiting (throwing up)   Diarrhea (loose bowel movements)   Changes in your liver function   Effects on the nerves called peripheral neuropathy. You may feel numbness, tingling, or pain in your hands and feet, and may be worse in cold temperatures. It may be hard for you to button your clothes, open jars, or walk as usual. The effect on the nerves may get worse with more doses of the drug. These effects get better in some people after the drug is stopped but it does not get better in all people  Note: Each of the side effects above was reported in 40% or greater of patients treated with oxaliplatin. Not all possible side effects are included above.   Warnings and Precautions   Allergic reactions, including anaphylaxis, which may be life-threatening are rare but may happen in some patients. Signs of allergic reaction to this drug may be swelling of the face, feeling like your tongue or throat are swelling, trouble breathing, rash, itching, fever, chills, feeling dizzy, and/or feeling that your heart is beating in a fast or not normal way. If this happens, do not take another dose of this drug. You should get urgent medical treatment.   Inflammation (swelling) of the lungs, which may be life-threatening. You may have a dry cough or trouble breathing.   Effects on the nerves (neuropathy) may resolve within 14 days, or it may persist beyond 14 days.   Severe decrease in white blood cells when combined with the chemotherapy agents 5-fluorouracil and leucovorin. This may be life-threatening.   Severe changes in your liver function   Abnormal heart beat and/or EKG, which can be life-threatening    Rhabdomyolysis- damage to your muscles which may release proteins in your blood and affect how your kidneys work, which can be life-threatening. You may have severe muscle weakness and/or pain, or dark urine.  Important Information   This drug may impair your ability to drive or use machinery. Talk to your doctor and/or nurse about precautions you may need to take.   This drug may be present in the saliva, tears, sweat, urine, stool, vomit, semen, and vaginal secretions. Talk to your doctor and/or your nurse about the necessary precautions to take during this time.  * The effects on the nerves can be aggravated by exposure to cold. Avoid cold beverages, use of ice and make sure you cover your skin and dress warmly prior to  being exposed to cold temperatures while you are receiving treatment with oxaliplatin*   Treating Side Effects   Manage tiredness by pacing your activities for the day.   Be sure to include periods of rest between energy-draining activities.   To decrease the risk of infection, wash your hands regularly.   Avoid close contact with people who have a cold, the flu, or other infections.  Take your temperature as your doctor or nurse tells you, and whenever you feel like you may have a fever.   To help decrease the risk of bleeding, use a soft toothbrush. Check with your nurse before using dental floss.   Be very careful when using knives or tools.   Use an electric shaver instead of a razor.   Drink plenty of fluids (a minimum of eight glasses per day is recommended).   Mouth care is very important. Your mouth care should consist of routine, gentle cleaning of your teeth or dentures and rinsing your mouth with a mixture of 1/2 teaspoon of salt in 8 ounces of water or 1/2 teaspoon of baking soda in 8 ounces of water. This should be done at least after each meal and at bedtime.   If you have mouth sores, avoid mouthwash that has alcohol. Also avoid alcohol and smoking  because they can bother your mouth and throat.   To help with nausea and vomiting, eat small, frequent meals instead of three large meals a day. Choose foods and drinks that are at room temperature. Ask your nurse or doctor about other helpful tips and medicine that is available to help stop or lessen these symptoms.   If you throw up or have loose bowel movements, you should drink more fluids so that you do not become dehydrated (lack of water in the body from losing too much fluid).   If you have diarrhea, eat low-fiber foods that are high in protein and calories and avoid foods that can irritate your digestive tracts or lead to cramping.   Ask your nurse or doctor about medicine that can lessen or stop your diarrhea.   If you have numbness and tingling in your hands and feet, be careful when cooking, walking, and handling sharp objects and hot liquids.   Do not drink cold drinks or use ice in beverages. Drink fluids at room temperature or warmer, and drink through a straw.   Wear gloves to touch cold objects, and wear warm clothing and cover you skin during cold weather.   Food and Drug Interactions   There are no known interactions of oxaliplatin with food and other medications.   This drug may interact with other medicines. Tell your doctor and pharmacist about all the prescription and over-the-counter medicines and dietary supplements (vitamins, minerals, herbs and others) that you are taking at this time. Also, check with your doctor or pharmacist before starting any new prescription or over-the-counter medicines, or dietary supplements to make sure that there are no interactions   When to Call the Doctor  Call your doctor or nurse if you have any of these symptoms and/or any new or unusual symptoms:   Fever of 100.4 F (38 C) or higher   Chills   Tiredness that interferes with your daily activities   Feeling dizzy or lightheaded   Easy bleeding or bruising   Feeling  that your heart is beating in a fast or not normal way (palpitations)   Pain in your chest   Dry cough   Trouble  breathing   Pain in your mouth or throat that makes it hard to eat or drink   Nausea that stops you from eating or drinking and/or is not relieved by prescribed medicines   Throwing up   Diarrhea, 4 times in one day or diarrhea with lack of strength or a feeling of being dizzy   Numbness, tingling, or pain in your hands and feet   Signs of possible liver problems: dark urine, pale bowel movements, bad stomach pain, feeling very tired and weak, unusual itching, or yellowing of the eyes or skin   Signs of rhabdomyolysis: decreased urine, very dark urine, muscle pain in the shoulders, thighs, or lower back; muscle weakness or trouble moving arms and legs   Signs of allergic reaction: swelling of the face, feeling like your tongue or throat are swelling, trouble breathing, rash, itching, fever, chills, feeling dizzy, and/or feeling that your heart is beating in a fast or not normal way. If this happens, call 911 for emergency care.   If you think you may be pregnant  Reproduction Warnings   Pregnancy warning: This drug may have harmful effects on the unborn baby. Women of childbearing potential should use effective methods of birth control during your cancer treatment. Let your doctor know right away if you think you may be pregnant or may have impregnated your partner.   Breastfeeding warning: It is not known if this drug passes into breast milk. For this reason, women should talk to their doctor about the risks and benefits of breastfeeding during treatment with this drug because this drug may enter the breast milk and cause harm to a breastfeeding baby.   Fertility warning: Human fertility studies have not been done with this drug. Talk with your doctor or nurse if you plan to have children. Ask for information on sperm or egg banking.   Leucovorin Calcium  About This  Drug  Leucovorin is a vitamin. It is used in combination with other cancer fighting drugs such as 5-fluorouracil and methotrexate. Leucovorin is given in the vein (IV).  This drug runs at the same time as the oxaliplatin and takes 2 hours to infuse.   Possible Side Effects  Rash and itching  Note: Leucovorin by itself has very few side effects. Other side effects you may have can be caused by the other drugs you are taking, such as 5-fluorouracil.   Warnings and Precautions   Allergic reactions, including anaphylaxis are rare but may happen in some patients. Signs of allergic reaction to this drug may be swelling of the face, feeling like your tongue or throat are swelling, trouble breathing, rash, itching, fever, chills, feeling dizzy, and/or feeling that your heart is beating in a fast or not normal way. If this happens, do not take another dose of this drug. You should get urgent medical treatment.  Food and Drug Interactions   There are no known interactions of leucovorin with food.   This drug may interact with other medicines. Tell your doctor and pharmacist about all the prescription and over-the-counter medicines and dietary supplements (vitamins, minerals, herbs and others) that you are taking at this time.   Also, check with your doctor or pharmacist before starting any new prescription or over-the-counter medicines, or dietary supplements to make sure that there are no interactions.   When to Call the Doctor  Call your doctor or nurse if you have any of these symptoms and/or any new or unusual symptoms:   A new rash or  a rash that is not relieved by prescribed medicines   Signs of allergic reaction: swelling of the face, feeling like your tongue or throat are swelling, trouble breathing, rash, itching, fever, chills, feeling dizzy, and/or feeling that your heart is beating in a fast or not normal way. If this happens, call 911 for emergency care.   If you think you may be  pregnant   Reproduction Warnings   Pregnancy warning: It is not known if this drug may harm an unborn child. For this reason, be sure to talk with your doctor if you are pregnant or planning to become pregnant while receiving this drug. Let your doctor know right away if you think you may be pregnant   Breastfeeding warning: It is not known if this drug passes into breast milk. For this reason, women should talk to their doctor about the risks and benefits of breastfeeding during treatment with this drug because this drug may enter the breast milk and cause harm to a breastfeeding baby.   Fertility warning: Human fertility studies have not been done with this drug. Talk with your doctor or nurse if you plan to have children. Ask for information on sperm or egg banking.   5-Fluorouracil (Adrucil; 5FU)  About This Drug  Fluorouracil is used to treat cancer. It is given in the vein (IV). It is given as an IV push from a syringe and also as a continuous infusion given via an ambulatory pump (a pump you take home and wear for a specified amount of time).  You will wear your pump for 24 hours, then return to the clinic to have it taken off.   Possible Side Effects   Bone marrow suppression. This is a decrease in the number of white blood cells, red blood cells, and platelets. This may raise your risk of infection, make you tired and weak (fatigue), and raise your risk of bleeding   Changes in the tissue of the heart and/or heart attack. Some changes may happen that can cause your heart to have less ability to pump blood.   Blurred vision or other changes in eyesight   Nausea and throwing up (vomiting)   Diarrhea (loose bowel movements)   Ulcers - sores that may cause pain or bleeding in your digestive tract, which includes your mouth, esophagus, stomach, small/large intestines and rectum   Soreness of the mouth and throat. You may have red areas, white patches, or sores that hurt.    Allergic reactions, including anaphylaxis are rare but may happen in some patients. Signs of allergic reaction to this drug may be swelling of the face, feeling like your tongue or throat are swelling, trouble breathing, rash, itching, fever, chills, feeling dizzy, and/or feeling that your heart is beating in a fast or not normal way. If this happens, do not take another dose of this drug. You should get urgent medical treatment.   Sensitivity to light (photosensitivity). Photosensitivity means that you may become more sensitive to the sun and/or light. You may get a skin rash/reaction if you are in the sun or are exposed to sun lamps and tanning beds. Your eyes may water more, mostly in bright light.   Changes in your nail color, nail loss and/or brittle nail   Darkening of the skin, or changes to the color of your skin and/or veins used for infusion   Rash, dry skin, or itching  Note: Not all possible side effects are included above.  Warnings and Precautions  Hand-and-foot syndrome. The palms of your hands or soles of your feet may tingle, become numb, painful, swollen, or red.   Changes in your central nervous system can happen. The central nervous system is made up of your brain and spinal cord. You could feel extreme tiredness, agitation, confusion, hallucinations (see or hear things that are not there), trouble understanding or speaking, loss of control of your bowels or bladder, eyesight changes, numbness or lack of strength to your arms, legs, face, or body, or coma. If you start to have any of these symptoms let your doctor know right away.   Side effects of this drug may be unexpectedly severe in some patients  Note: Some of the side effects above are very rare. If you have concerns and/or questions, please discuss them with your medical team.   Important Information   This drug may be present in the saliva, tears, sweat, urine, stool, vomit, semen, and vaginal secretions. Talk  to your doctor and/or your nurse about the necessary precautions to take during this time.   Treating Side Effects   Manage tiredness by pacing your activities for the day.   Be sure to include periods of rest between energy-draining activities.   To help decrease the risk of infections, wash your hands regularly.   Avoid close contact with people who have a cold, the flu, or other infections.   Take your temperature as your doctor or nurse tells you, and whenever you feel like you may have a fever.   Use a soft toothbrush. Check with your nurse before using dental floss.   Be very careful when using knives or tools.   Use an electric shaver instead of a razor.   If you have a nose bleed, sit with your head tipped slightly forward. Apply pressure by lightly pinching the bridge of your nose between your thumb and forefinger. Call your doctor if you feel dizzy or faint or if the bleeding doesn't stop after 10 to 15 minutes.   Drink plenty of fluids (a minimum of eight glasses per day is recommended).   If you throw up or have loose bowel movements, you should drink more fluids so that you do not  become dehydrated (lack of water in the body from losing too much fluid).   To help with nausea and vomiting, eat small, frequent meals instead of three large meals a day. Choose foods and drinks that are at room temperature. Ask your nurse or doctor about other helpful tips and medicine that is available to help, stop, or lessen these symptoms.   If you have diarrhea, eat low-fiber foods that are high in protein and calories and avoid foods that can irritate your digestive tracts or lead to cramping.   Ask your nurse or doctor about medicine that can lessen or stop your diarrhea.   Mouth care is very important. Your mouth care should consist of routine, gentle cleaning of your teeth or dentures and rinsing your mouth with a mixture of 1/2 teaspoon of salt in 8 ounces of water or 1/2  teaspoon of baking soda in 8 ounces of water. This should be done at least after each meal and at bedtime.   If you have mouth sores, avoid mouthwash that has alcohol. Also avoid alcohol and smoking because they can bother your mouth and throat.   Keeping your nails moisturized may help with brittleness.   To help with itching, moisturize your skin several times day.   Use sunscreen  with SPF 30 or higher when you are outdoors even for a short time. Cover up when you are out in the sun. Wear wide-brimmed hats, long-sleeved shirts, and pants. Keep your neck, chest, and back covered. Wear dark sun glasses when in the sun or bright lights.   If you get a rash do not put anything on it unless your doctor or nurse says you may. Keep the area around the rash clean and dry. Ask your doctor for medicine if your rash bothers you.   Keeping your pain under control is important to your well-being. Please tell your doctor or nurse if you are experiencing pain.   Food and Drug Interactions   There are no known interactions of fluorouracil with food.   Check with your doctor or pharmacist about all other prescription medicines and over-the-counter medicines and dietary supplements (vitamins, minerals, herbs and others) you are taking before starting this medicine as there are known drug interactions with 5-fluoroucacil. Also, check with your doctor or pharmacist before starting any new prescription or over-the-counter medicines, or dietary supplements to make sure that there are no interactions.  When to Call the Doctor  Call your doctor or nurse if you have any of these symptoms and/or any new or unusual symptoms:   Fever of 100.4 F (38 C) or higher   Chills   Easy bleeding or bruising   Nose bleed that doesn't stop bleeding after 10-15 minutes   Trouble breathing   Feeling dizzy or lightheaded   Feeling that your heart is beating in a fast or not normal way (palpitations)   Chest pain or  symptoms of a heart attack. Most heart attacks involve pain in the center of the chest that lasts more than a few minutes. The pain may go away and come back or it can be constant. It can feel like pressure, squeezing, fullness, or pain. Sometimes pain is felt in one or both arms, the back, neck, jaw, or stomach. If any of these symptoms last 2 minutes, call 911.   Confusion and/or agitation   Hallucinations   Trouble understanding or speaking   Loss of control of bowels or bladder   Blurry vision or changes in your eyesight   Headache that does not go away   Numbness or lack of strength to your arms, legs, face, or body   Nausea that stops you from eating or drinking and/or is not relieved by prescribed medicines   Throwing up more than 3 times a day   Diarrhea, 4 times in one day or diarrhea with lack of strength or a feeling of being dizzy   Pain in your mouth or throat that makes it hard to eat or drink   Pain along the digestive tract - especially if worse after eating   Blood in your vomit (bright red or coffee-ground) and/or stools (bright red, or black/tarry)   Coughing up blood   Tiredness that interferes with your daily activities   Painful, red, or swollen areas on your hands or feet or around your nails   A new rash or a rash that is not relieved by prescribed medicines   Develop sensitivity to sunlight/light   Numbness and/or tingling of your hands and/or feet   Signs of allergic reaction: swelling of the face, feeling like your tongue or throat are swelling, trouble breathing, rash, itching, fever, chills, feeling dizzy, and/or feeling that your heart is beating in a fast or not normal way. If this happens,  call 911 for emergency care.   If you think you are pregnant or may have impregnated your partner  Reproduction Warnings   Pregnancy warning: This drug may have harmful effects on the unborn baby. Women of child bearing potential should use effective  methods of birth control during your cancer treatment and 3 months after treatment. Men with male partners of childbearing potential should use effective methods of birth control during your cancer treatment and for 3 months after your cancer treatment. Let your doctor know right away if you think you may be pregnant or may have impregnated your partner.   Breastfeeding warning: It is not known if this drug passes into breast milk. For this reason, Women should not breastfeed during treatment because this drug could enter the breast milk and cause harm to a breastfeeding baby.   Fertility warning: In men and women both, this drug may affect your ability to have children in the future. Talk with your doctor or nurse if you plan to have children. Ask for information on sperm or egg banking.    SELF CARE ACTIVITIES WHILE ON CHEMOTHERAPY/IMMUNOTHERAPY:  Hydration Increase your fluid intake 48 hours prior to treatment and drink at least 8 to 12 cups (64 ounces) of water/decaffeinated beverages per day after treatment. You can still have your cup of coffee or soda but these beverages do not count as part of your 8 to 12 cups that you need to drink daily. No alcohol intake.  Medications Continue taking your normal prescription medication as prescribed.  If you start any new herbal or new supplements please let us know first to make sure it is safe.  Mouth Care Have teeth cleaned professionally before starting treatment. Keep dentures and partial plates clean. Use soft toothbrush and do not use mouthwashes that contain alcohol. Biotene is a good mouthwash that is available at most pharmacies or may be ordered by calling [phone]. Use warm salt water gargles (1 teaspoon salt per 1 quart warm water) before and after meals and at bedtime. Or you may rinse with 2 tablespoons of three-percent hydrogen peroxide mixed in eight ounces of water. If you are still having problems with your mouth or sores in  your mouth please call the clinic. If you need dental work, please let the doctor know before you go for your appointment so that we can coordinate the best possible time for you in regards to your chemo regimen. You need to also let your dentist know that you are actively taking chemo. We may need to do labs prior to your dental appointment.  Skin Care Always use sunscreen that has not expired and with SPF (Sun Protection Factor) of 50 or higher. Wear hats to protect your head from the sun. Remember to use sunscreen on your hands, ears, face, & feet.  Use good moisturizing lotions such as udder cream, eucerin, or even Vaseline. Some chemotherapies can cause dry skin, color changes in your skin and nails.    Avoid long, hot showers or baths. Use gentle, fragrance-free soaps and laundry detergent. Use moisturizers, preferably creams or ointments rather than lotions because the thicker consistency is better at preventing skin dehydration. Apply the cream or ointment within 15 minutes of showering. Reapply moisturizer at night, and moisturize your hands every time after you wash them.   Infection Prevention Please wash your hands for at least 30 seconds using warm soapy water. Handwashing is the #1 way to prevent the spread of germs. Stay away from  sick people or people who are getting over a cold. If you develop respiratory systems such as green/yellow mucus production or productive cough or persistent cough let us know and we will see if you need an antibiotic. It is a good idea to keep a pair of gloves on when going into grocery stores/[employer] to decrease your risk of coming into contact with germs on the carts, etc. Carry alcohol hand gel with you at all times and use it frequently if out in public. If your temperature reaches 100.5 or higher please call the clinic and let us know.  If it is after hours or on the weekend please go to the ER if your temperature is over 100.4.  Please have your own  personal thermometer at home to use.    Sex and bodily fluids If you are going to have sex, a condom must be used to protect the person that isn't taking immunotherapy. For a few days after treatment, immunotherapy can be excreted through your bodily fluids.  When using the toilet please close the lid and flush the toilet twice.  Do this for a few day after you have had immunotherapy.   Contraception It is not known for sure whether or not immunotherapy drugs can be passed on through semen or secretions from the vagina. Because of this some doctors advise people to use a barrier method if you have sex during treatment. This applies to vaginal, anal or oral sex.  Generally, doctors advise a barrier method only for the time you are actually having the treatment and for about a week after your treatment.  Advice like this can be worrying, but this does not mean that you have to avoid being intimate with your partner. You can still have close contact with your partner and continue to enjoy sex.  Animals If you have cats or birds we just ask that you not change the litter or change the cage.  Please have someone else do this for you while you are on immunotherapy.   Food Safety During and After Cancer Treatment Food safety is important for people both during and after cancer treatment. Cancer and cancer treatments, such as chemotherapy, radiation therapy, and stem cell/bone marrow transplantation, often weaken the immune system. This makes it harder for your body to protect itself from foodborne illness, also called food poisoning. Foodborne illness is caused by eating food that contains harmful bacteria, parasites, or viruses.  Foods to avoid Some foods have a higher risk of becoming tainted with bacteria. These include: Unwashed fresh fruit and vegetables, especially leafy vegetables that can hide dirt and other contaminants Raw sprouts, such as alfalfa sprouts Raw or undercooked beef, especially  ground beef, or other raw or undercooked meat and poultry Fatty, fried, or spicy foods immediately before or after treatment.  These can sit heavy on your stomach and make you feel nauseous. Raw or undercooked shellfish, such as oysters. Sushi and sashimi, which often contain raw fish.  Unpasteurized beverages, such as unpasteurized fruit juices, raw milk, raw yogurt, or cider Undercooked eggs, such as soft boiled, over easy, and poached; raw, unpasteurized eggs; or foods made with raw egg, such as homemade raw cookie dough and homemade mayonnaise  Simple steps for food safety  Shop smart. Do not buy food stored or displayed in an unclean area. Do not buy bruised or damaged fruits or vegetables. Do not buy cans that have cracks, dents, or bulges. Pick up foods that can spoil at the  end of your shopping trip and store them in a cooler on the way home.  Prepare and clean up foods carefully. Rinse all fresh fruits and vegetables under running water, and dry them with a clean towel or paper towel. Clean the top of cans before opening them. After preparing food, wash your hands for 20 seconds with hot water and soap. Pay special attention to areas between fingers and under nails. Clean your utensils and dishes with hot water and soap. Disinfect your kitchen and cutting boards using 1 teaspoon of liquid, unscented bleach mixed into 1 quart of water.    Dispose of old food. Eat canned and packaged food before its expiration date (the "use by" or "best before" date). Consume refrigerated leftovers within 3 to 4 days. After that time, throw out the food. Even if the food does not smell or look spoiled, it still may be unsafe. Some bacteria, such as Listeria, can grow even on foods stored in the refrigerator if they are kept for too long.  Take precautions when eating out. At restaurants, avoid buffets and salad bars where food sits out for a long time and comes in contact with many people. Food can  become contaminated when someone with a virus, often a norovirus, or another "bug" handles it. Put any leftover food in a "to-go" container yourself, rather than having the server do it. And, refrigerate leftovers as soon as you get home. Choose restaurants that are clean and that are willing to prepare your food as you order it cooked.    SYMPTOMS TO REPORT AS SOON AS POSSIBLE AFTER TREATMENT:  FEVER GREATER THAN 100.4 F CHILLS WITH OR WITHOUT FEVER NAUSEA AND VOMITING THAT IS NOT CONTROLLED WITH YOUR NAUSEA MEDICATION UNUSUAL SHORTNESS OF BREATH UNUSUAL BRUISING OR BLEEDING TENDERNESS IN MOUTH AND THROAT WITH OR WITHOUT PRESENCE OF ULCERS URINARY PROBLEMS BOWEL PROBLEMS UNUSUAL RASH     Wear comfortable clothing and clothing appropriate for easy access to any Portacath or PICC line. Let us know if there is anything that we can do to make your therapy better!   What to do if you need assistance after hours or on the weekends: CALL [phone].  HOLD on the line, do not hang up.  You will hear multiple messages but at the end you will be connected with a nurse triage line.  They will contact the doctor if necessary.  Most of the time they will be able to assist you.  Do not call the hospital operator.    I have been informed and understand all of the instructions given to me and have received a copy. I have been instructed to call the clinic [phone] or my family physician as soon as possible for continued medical care, if indicated. I do not have any more questions at this time but understand that I may call the [facility] or the Patient Navigator at [phone] during office hours should I have questions or need assistance in obtaining follow-up care.

## 2021-06-23 NOTE — Op Note (Signed)
Patient:  Steven Martinez  DOB:  Dec 31, 1990  MRN:  [id number]   Preop Diagnosis: Gastric carcinoma, need for central venous access  Postop Diagnosis: Same  Procedure: Port-A-Cath insertion  Surgeon: Aviva Signs, MD  Anes: MAC  Indications: Patient is a 30 year old Hispanic male who was recently diagnosed with gastric cancer who is now about to undergo neoadjuvant chemotherapy.  He needs a Port-A-Cath for central venous access.  The risks and benefits of the procedure including bleeding, infection, and pneumothorax were fully explained to the patient through an interpreter, gave informed consent.  Procedure note: The patient was placed in the Trendelenburg position after the left upper chest was prepped and draped using the usual sterile technique with ChloraPrep.  Surgical site confirmation was performed.  1% Xylocaine was used for local anesthesia.  Incision was made below the left clavicle.  A subcutaneous pocket was formed.  A needle was advanced into the left subclavian vein using the Seldinger technique.  A guidewire was then advanced into the right atrium under fluoroscopic guidance.  An introducer and peel-away sheath were placed over the guidewire.  The catheter was inserted through the peel-away sheath and the peel-away sheath was removed.  The catheter was then attached to the port and the port placed in subcutaneous pocket.  Adequate positioning was confirmed by fluoroscopy.  Good backflow of venous blood was noted on aspiration of the port.  The port was flushed with heparin flush.  Subcutaneous layer was reapproximated using 3-0 Vicryl interrupted suture.  The skin was closed using a 4-0 Monocryl subcuticular suture.  Dermabond was applied.  All tape and needle counts were correct at the end of the procedure.  The patient was awakened and transferred to PACU in stable condition.  A chest x-ray will be performed at that time.  Complications: None  EBL: Minimal  Specimen:  None

## 2021-06-23 NOTE — Anesthesia Preprocedure Evaluation (Signed)
Anesthesia Evaluation  Patient identified by MRN, date of birth, ID band Patient awake    Reviewed: Allergy & Precautions, H&P , NPO status , Patient's Chart, lab work & pertinent test results, reviewed documented beta blocker date and time   Airway Mallampati: II  TM Distance: >3 FB Neck ROM: full    Dental no notable dental hx.    Pulmonary neg pulmonary ROS,    Pulmonary exam normal breath sounds clear to auscultation       Cardiovascular Exercise Tolerance: Good negative cardio ROS   Rhythm:regular Rate:Normal     Neuro/Psych negative neurological ROS  negative psych ROS   GI/Hepatic Neg liver ROS, PUD, GERD  Medicated,  Endo/Other  negative endocrine ROS  Renal/GU negative Renal ROS  negative genitourinary   Musculoskeletal   Abdominal   Peds  Hematology  (+) Blood dyscrasia, anemia ,   Anesthesia Other Findings   Reproductive/Obstetrics negative OB ROS                             Anesthesia Physical Anesthesia Plan  ASA: 3  Anesthesia Plan: General   Post-op Pain Management:    Induction:   PONV Risk Score and Plan: Propofol infusion  Airway Management Planned:   Additional Equipment:   Intra-op Plan:   Post-operative Plan:   Informed Consent: I have reviewed the patients History and Physical, chart, labs and discussed the procedure including the risks, benefits and alternatives for the proposed anesthesia with the patient or authorized representative who has indicated his/her understanding and acceptance.     Dental Advisory Given  Plan Discussed with: CRNA  Anesthesia Plan Comments:         Anesthesia Quick Evaluation

## 2021-06-24 ENCOUNTER — Encounter (HOSPITAL_COMMUNITY): Payer: Self-pay | Admitting: General Surgery

## 2021-06-24 ENCOUNTER — Inpatient Hospital Stay (HOSPITAL_COMMUNITY): Payer: 59 | Attending: Hematology

## 2021-06-24 ENCOUNTER — Encounter (HOSPITAL_COMMUNITY): Payer: Self-pay | Admitting: Hematology

## 2021-06-24 ENCOUNTER — Inpatient Hospital Stay (HOSPITAL_BASED_OUTPATIENT_CLINIC_OR_DEPARTMENT_OTHER): Payer: 59 | Admitting: Hematology

## 2021-06-24 ENCOUNTER — Inpatient Hospital Stay (HOSPITAL_COMMUNITY): Payer: 59 | Admitting: General Practice

## 2021-06-24 VITALS — BP 131/82 | HR 92 | Temp 99.3°F | Resp 18 | Wt 158.8 lb

## 2021-06-24 DIAGNOSIS — K5900 Constipation, unspecified: Secondary | ICD-10-CM | POA: Diagnosis not present

## 2021-06-24 DIAGNOSIS — F1010 Alcohol abuse, uncomplicated: Secondary | ICD-10-CM | POA: Insufficient documentation

## 2021-06-24 DIAGNOSIS — Z79899 Other long term (current) drug therapy: Secondary | ICD-10-CM | POA: Insufficient documentation

## 2021-06-24 DIAGNOSIS — R634 Abnormal weight loss: Secondary | ICD-10-CM | POA: Insufficient documentation

## 2021-06-24 DIAGNOSIS — C169 Malignant neoplasm of stomach, unspecified: Secondary | ICD-10-CM

## 2021-06-24 DIAGNOSIS — Z95828 Presence of other vascular implants and grafts: Secondary | ICD-10-CM

## 2021-06-24 DIAGNOSIS — R109 Unspecified abdominal pain: Secondary | ICD-10-CM | POA: Insufficient documentation

## 2021-06-24 DIAGNOSIS — Z5189 Encounter for other specified aftercare: Secondary | ICD-10-CM | POA: Diagnosis not present

## 2021-06-24 DIAGNOSIS — C163 Malignant neoplasm of pyloric antrum: Secondary | ICD-10-CM

## 2021-06-24 DIAGNOSIS — Z23 Encounter for immunization: Secondary | ICD-10-CM | POA: Insufficient documentation

## 2021-06-24 DIAGNOSIS — K219 Gastro-esophageal reflux disease without esophagitis: Secondary | ICD-10-CM | POA: Diagnosis not present

## 2021-06-24 DIAGNOSIS — D649 Anemia, unspecified: Secondary | ICD-10-CM | POA: Insufficient documentation

## 2021-06-24 DIAGNOSIS — Z5111 Encounter for antineoplastic chemotherapy: Secondary | ICD-10-CM | POA: Diagnosis not present

## 2021-06-24 MED ORDER — INFLUENZA VAC SPLIT QUAD 0.5 ML IM SUSY: 0.5000 mL | PREFILLED_SYRINGE | Freq: Once | INTRAMUSCULAR | Status: DC

## 2021-06-24 NOTE — Patient Instructions (Addendum)
[place] [facility] Discharge Instructions  You were seen today by Dr. Delton Coombes. He went over your recent results. You will be scheduled to start a chemotherapy treatment called FLOT. This treatment can cause cold sensitivity; avoid cold foods and liquids for the first week following treatment. The treatment can also cause diarrhea. Purchase Imodium over the counter and take 2 tablets at the onset of diarrhea, and 1 tablet with every subsequent watery bowel movement. Other side effects of this treatment can include: neuropathy (tingling or numbness) and hair loss.   Dr. Delton Coombes will see you back in 2 weeks after your first treatment for labs and follow up.   Thank you for choosing [place] at [facility] to provide your oncology and hematology care.  To afford each patient quality time with our provider, please arrive at least 15 minutes before your scheduled appointment time.   If you have a lab appointment with the [facility] please come in thru the Main Entrance and check in at the main information desk  You need to re-schedule your appointment should you arrive 10 or more minutes late.  We strive to give you quality time with our providers, and arriving late affects you and other patients whose appointments are after yours.  Also, if you no show three or more times for appointments you may be dismissed from the clinic at the providers discretion.     Again, thank you for choosing [facility].  Our hope is that these requests will decrease the amount of time that you wait before being seen by our physicians.       _____________________________________________________________  Should you have questions after your visit to [facility], please contact our office at [phone] between the hours of 8:00 a.m. and 4:30 p.m.  Voicemails left after 4:00 p.m. will not be returned until the following business day.   For prescription refill requests, have your pharmacy contact our office and allow 72 hours.    Cancer Center Support Programs:   > Cancer Support Group  2nd Tuesday of the month 1pm-2pm, Journey Room

## 2021-06-24 NOTE — Progress Notes (Signed)
Pharmacist Chemotherapy Monitoring - Initial Assessment    Anticipated start date: 10/05/022   The following has been reviewed per standard work regarding the patient's treatment regimen: The patient's diagnosis, treatment plan and drug doses, and organ/hematologic function Lab orders and baseline tests specific to treatment regimen  The treatment plan start date, drug sequencing, and pre-medications Prior authorization status  Patient's documented medication list, including drug-drug interaction screen and prescriptions for anti-emetics and supportive care specific to the treatment regimen The drug concentrations, fluid compatibility, administration routes, and timing of the medications to be used The patient's access for treatment and lifetime cumulative dose history, if applicable  The patient's medication allergies and previous infusion related reactions, if applicable   Changes made to treatment plan:  treatment plan date  Follow up needed:  N/A   Wynona Neat, [facility], 06/24/2021  4:46 PM

## 2021-06-24 NOTE — Progress Notes (Signed)

## 2021-06-24 NOTE — Progress Notes (Signed)
[facility] CSW Progress Notes  Met with patient and wife in exam room after chemo education. He is scheduled to begin chemotherapy tomorrow for gastric cancer and is nervous about this process.  Wife is asking about emergency Medicaid, concerned about medical bills and financial stress.  Patient is now unable to work at all.  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  Family has 4 children (ages 38 - 74), do not have Physicist, medical.  Depend on wife's income as a CNA, she is trying to pick up extra shifts/jobs as possible.  Overall their financial picture now includes significant stress as they have lost his income.    Referred patient to [facility] for help w applying for Social Security disability, Government social research officer for [employer] program, [place] for their small grant program.  Also messaged [place] so she can meet w them re the [employer].    Per wife, they have not been contacted by anyone to assist w Medicaid application, they are in contact w [place].  Designer, multimedia for status update on this process.  Edwyna Shell, LCSW Clinical Social Worker Phone:  [phone]

## 2021-06-25 ENCOUNTER — Encounter (HOSPITAL_COMMUNITY): Payer: Self-pay | Admitting: Hematology

## 2021-06-25 ENCOUNTER — Inpatient Hospital Stay (HOSPITAL_COMMUNITY): Payer: 59

## 2021-06-25 ENCOUNTER — Other Ambulatory Visit: Payer: Self-pay

## 2021-06-25 VITALS — BP 140/85 | HR 91 | Temp 99.1°F | Resp 18 | Wt 160.6 lb

## 2021-06-25 DIAGNOSIS — Z5111 Encounter for antineoplastic chemotherapy: Secondary | ICD-10-CM | POA: Diagnosis not present

## 2021-06-25 DIAGNOSIS — C163 Malignant neoplasm of pyloric antrum: Secondary | ICD-10-CM

## 2021-06-25 DIAGNOSIS — Z95828 Presence of other vascular implants and grafts: Secondary | ICD-10-CM

## 2021-06-25 LAB — CBC WITH DIFFERENTIAL/PLATELET
Abs Immature Granulocytes: 0.01 10*3/uL (ref 0.00–0.07)
Basophils Absolute: 0 10*3/uL (ref 0.0–0.1)
Basophils Relative: 1 %
Eosinophils Absolute: 0.2 10*3/uL (ref 0.0–0.5)
Eosinophils Relative: 3 %
HCT: 30.2 % — ABNORMAL LOW (ref 39.0–52.0)
Hemoglobin: 9.8 g/dL — ABNORMAL LOW (ref 13.0–17.0)
Immature Granulocytes: 0 %
Lymphocytes Relative: 23 %
Lymphs Abs: 1.3 10*3/uL (ref 0.7–4.0)
MCH: 26.2 pg (ref 26.0–34.0)
MCHC: 32.5 g/dL (ref 30.0–36.0)
MCV: 80.7 fL (ref 80.0–100.0)
Monocytes Absolute: 0.3 10*3/uL (ref 0.1–1.0)
Monocytes Relative: 6 %
Neutro Abs: 4 10*3/uL (ref 1.7–7.7)
Neutrophils Relative %: 67 %
Platelets: 268 10*3/uL (ref 150–400)
RBC: 3.74 MIL/uL — ABNORMAL LOW (ref 4.22–5.81)
RDW: 13.3 % (ref 11.5–15.5)
WBC: 5.8 10*3/uL (ref 4.0–10.5)
nRBC: 0 % (ref 0.0–0.2)

## 2021-06-25 LAB — COMPREHENSIVE METABOLIC PANEL
ALT: 19 U/L (ref 0–44)
AST: 25 U/L (ref 15–41)
Albumin: 3.8 g/dL (ref 3.5–5.0)
Alkaline Phosphatase: 75 U/L (ref 38–126)
Anion gap: 7 (ref 5–15)
BUN: 16 mg/dL (ref 6–20)
CO2: 27 mmol/L (ref 22–32)
Calcium: 8.4 mg/dL — ABNORMAL LOW (ref 8.9–10.3)
Chloride: 103 mmol/L (ref 98–111)
Creatinine, Ser: 0.79 mg/dL (ref 0.61–1.24)
GFR, Estimated: >60 mL/min (ref >60)
Glucose, Bld: 166 mg/dL — ABNORMAL HIGH (ref 70–99)
Potassium: 3.8 mmol/L (ref 3.5–5.1)
Sodium: 137 mmol/L (ref 135–145)
Total Bilirubin: 0.5 mg/dL (ref 0.3–1.2)
Total Protein: 6.9 g/dL (ref 6.5–8.1)

## 2021-06-25 MED ORDER — LEUCOVORIN CALCIUM INJECTION 350 MG
200.0000 mg/m2 | Freq: Once | INTRAVENOUS | Status: AC
  Administered 2021-06-25: 366 mg via INTRAVENOUS
  Filled 2021-06-25: qty 18.3

## 2021-06-25 MED ORDER — SODIUM CHLORIDE 0.9 % IV SOLN
50.0000 mg/m2 | Freq: Once | INTRAVENOUS | Status: AC
  Administered 2021-06-25: 90 mg via INTRAVENOUS
  Filled 2021-06-25: qty 9

## 2021-06-25 MED ORDER — INFLUENZA VAC SPLIT QUAD 0.5 ML IM SUSY
0.5000 mL | PREFILLED_SYRINGE | Freq: Once | INTRAMUSCULAR | Status: AC
  Administered 2021-06-25: 0.5 mL via INTRAMUSCULAR
  Filled 2021-06-25: qty 0.5

## 2021-06-25 MED ORDER — SODIUM CHLORIDE 0.9 % IV SOLN
10.0000 mg | Freq: Once | INTRAVENOUS | Status: AC
  Administered 2021-06-25: 10 mg via INTRAVENOUS
  Filled 2021-06-25: qty 10

## 2021-06-25 MED ORDER — DEXTROSE 5 % IV SOLN: Freq: Once | INTRAVENOUS | Status: AC

## 2021-06-25 MED ORDER — DEXTROSE 5 % IV SOLN: INTRAVENOUS | Status: DC

## 2021-06-25 MED ORDER — OXALIPLATIN CHEMO INJECTION 100 MG/20ML
85.0000 mg/m2 | Freq: Once | INTRAVENOUS | Status: AC
  Administered 2021-06-25: 155 mg via INTRAVENOUS
  Filled 2021-06-25: qty 21

## 2021-06-25 MED ORDER — LEUCOVORIN CALCIUM INJECTION 350 MG
200.0000 mg/m2 | Freq: Once | INTRAVENOUS | Status: DC
  Filled 2021-06-25: qty 18.3

## 2021-06-25 MED ORDER — SODIUM CHLORIDE 0.9 % IV SOLN: INTRAVENOUS | Status: DC

## 2021-06-25 MED ORDER — PALONOSETRON HCL INJECTION 0.25 MG/5ML
0.2500 mg | Freq: Once | INTRAVENOUS | Status: AC
  Administered 2021-06-25: 0.25 mg via INTRAVENOUS
  Filled 2021-06-25: qty 5

## 2021-06-25 MED ORDER — SODIUM CHLORIDE 0.9 % IV SOLN
5000.0000 mg | INTRAVENOUS | Status: DC
  Administered 2021-06-25: 5000 mg via INTRAVENOUS
  Filled 2021-06-25: qty 100

## 2021-06-25 NOTE — Patient Instructions (Addendum)
[facility]  Discharge Instructions: Thank you for choosing [facility] to provide your oncology and hematology care.  If you have a lab appointment with the [facility], please come in thru the Main Entrance and check in at the main information desk.  Wear comfortable clothing and clothing appropriate for easy access to any Portacath or PICC line.   We strive to give you quality time with your provider. You may need to reschedule your appointment if you arrive late (15 or more minutes).  Arriving late affects you and other patients whose appointments are after yours.  Also, if you miss three or more appointments without notifying the office, you may be dismissed from the clinic at the provider's discretion.      For prescription refill requests, have your pharmacy contact our office and allow 72 hours for refills to be completed.    Today you received the following chemotherapy and/or immunotherapy agents Taxotere,Oxaliplatin, Leucovorin, and 5FU.   The chemotherapy medication bag should finish at 24 hours, 46 hours, 96 hours, or 7 days. For example, if your pump is scheduled for 46 hours and it was put on at 4:00 p.m., it should finish at 2:00 p.m. the day it is scheduled to come off regardless of your appointment time.     Estimated time to finish at 3:00 pm   If the display on your pump reads "Low Volume" and it is beeping, take the batteries out of the pump and come to the cancer center for it to be taken off.   If the pump alarms go off prior to the pump reading "Low Volume" then call [phone] and someone can assist you.  If the plunger comes out and the chemotherapy medication is leaking out, please use your home chemo spill kit to clean up the spill. Do NOT use paper towels or other household products.  If you have problems or questions regarding your pump, please call either [phone] (24 hours a day) or the cancer center Monday-Friday 8:00 a.m.-  4:30 p.m. at the clinic number and we will assist you. If you are unable to get assistance, then go to the nearest Emergency Department and ask the staff to contact the IV team for assistance.   To help prevent nausea and vomiting after your treatment, we encourage you to take your nausea medication as directed.  BELOW ARE SYMPTOMS THAT SHOULD BE REPORTED IMMEDIATELY: *FEVER GREATER THAN 100.4 F (38 C) OR HIGHER *CHILLS OR SWEATING *NAUSEA AND VOMITING THAT IS NOT CONTROLLED WITH YOUR NAUSEA MEDICATION *UNUSUAL SHORTNESS OF BREATH *UNUSUAL BRUISING OR BLEEDING *URINARY PROBLEMS (pain or burning when urinating, or frequent urination) *BOWEL PROBLEMS (unusual diarrhea, constipation, pain near the anus) TENDERNESS IN MOUTH AND THROAT WITH OR WITHOUT PRESENCE OF ULCERS (sore throat, sores in mouth, or a toothache) UNUSUAL RASH, SWELLING OR PAIN  UNUSUAL VAGINAL DISCHARGE OR ITCHING   Items with * indicate a potential emergency and should be followed up as soon as possible or go to the Emergency Department if any problems should occur.  Please show the CHEMOTHERAPY ALERT CARD or IMMUNOTHERAPY ALERT CARD at check-in to the Emergency Department and triage nurse.  Should you have questions after your visit or need to cancel or reschedule your appointment, please contact [facility] [phone]  and follow the prompts.  Office hours are 8:00 a.m. to 4:30 p.m. Monday - Friday. Please note that voicemails left after 4:00 p.m. may not be returned until the  following business day.  We are closed weekends and major holidays. You have access to a nurse at all times for urgent questions. Please call the main number to the clinic [phone] and follow the prompts.  For any non-urgent questions, you may also contact your provider using MyChart. We now offer e-Visits for anyone 40 and older to request care online for non-urgent symptoms. For details visit [email or web address].   Also download  the MyChart app! Go to the app store, search "MyChart", open the app, select [facility], and log in with your MyChart username and password.  Due to Covid, a mask is required upon entering the hospital/clinic. If you do not have a mask, one will be given to you upon arrival. For doctor visits, patients may have 1 support person aged 32 or older with them. For treatment visits, patients cannot have anyone with them due to current Covid guidelines and our immunocompromised population. [facility]  Discharge Instructions: Thank you for choosing [facility] to provide your oncology and hematology care.  If you have a lab appointment with the [facility], please come in thru the Main Entrance and check in at the main information desk.  Wear comfortable clothing and clothing appropriate for easy access to any Portacath or PICC line.   We strive to give you quality time with your provider. You may need to reschedule your appointment if you arrive late (15 or more minutes).  Arriving late affects you and other patients whose appointments are after yours.  Also, if you miss three or more appointments without notifying the office, you may be dismissed from the clinic at the provider's discretion.      For prescription refill requests, have your pharmacy contact our office and allow 72 hours for refills to be completed.

## 2021-06-25 NOTE — Progress Notes (Signed)
Pt presents today of Taxotere, Oxaliplatin,Leucovorin and 5FU and a flu shot. Vitals and labs WNL parameters for treatment. Okay to proceed  with treatment today.  Education provided on ambulatory pump and spill kit given patient and pt's wife verbalized understanding. Infusion system pump video watched by patient in Spanish and by wife in [place].  Treatment given today per MD orders. Tolerated infusion without adverse affects. Vital signs stable. No complaints at this time. Discharged from clinic ambulatory in stable condition. Alert and oriented x 3. F/U with [facility] as scheduled.  5FU ambulatory pump infusing.

## 2021-06-26 ENCOUNTER — Ambulatory Visit (HOSPITAL_COMMUNITY): Payer: Self-pay | Admitting: Hematology

## 2021-06-26 ENCOUNTER — Inpatient Hospital Stay (HOSPITAL_COMMUNITY): Payer: 59

## 2021-06-26 VITALS — BP 130/83 | HR 99 | Temp 99.2°F | Resp 18

## 2021-06-26 DIAGNOSIS — Z95828 Presence of other vascular implants and grafts: Secondary | ICD-10-CM

## 2021-06-26 DIAGNOSIS — C163 Malignant neoplasm of pyloric antrum: Secondary | ICD-10-CM

## 2021-06-26 DIAGNOSIS — Z5111 Encounter for antineoplastic chemotherapy: Secondary | ICD-10-CM | POA: Diagnosis not present

## 2021-06-26 MED ORDER — PEGFILGRASTIM-CBQV 6 MG/0.6ML ~~LOC~~ SOSY
6.0000 mg | PREFILLED_SYRINGE | Freq: Once | SUBCUTANEOUS | Status: AC
  Administered 2021-06-26: 6 mg via SUBCUTANEOUS
  Filled 2021-06-26: qty 0.6

## 2021-06-26 MED ORDER — PROCHLORPERAZINE MALEATE 10 MG PO TABS
10.0000 mg | ORAL_TABLET | Freq: Once | ORAL | Status: AC
  Administered 2021-06-26: 10 mg via ORAL
  Filled 2021-06-26: qty 1

## 2021-06-26 MED ORDER — HEPARIN SOD (PORK) LOCK FLUSH 100 UNIT/ML IV SOLN
500.0000 [IU] | Freq: Once | INTRAVENOUS | Status: AC | PRN
  Administered 2021-06-26: 500 [IU]

## 2021-06-26 MED ORDER — SODIUM CHLORIDE 0.9% FLUSH
10.0000 mL | INTRAVENOUS | Status: DC | PRN
  Administered 2021-06-26: 10 mL

## 2021-06-26 NOTE — Progress Notes (Signed)
.  Steven Martinez presents today for injection and chemotherapy pump disconnection per the provider's orders. Port flushed easily with 59ml NS and 5 ml of heparin. Needle removed intact  Udenyca administration without incident; injection site WNL; see MAR for injection details.  Patient tolerated procedure well and without incident.  No questions or complaints noted at this time.  Pt c/o nausea and requested a compazine 10 mg p.o. Message send to Dr. Raliegh Ip and he stated to give pt 10 mg of compazine.   Chemotherapy pump disconnection and Udenyca injection and given today per MD orders. Tolerated infusion without adverse affects. Vital signs stable. No complaints at this time. Discharged from clinic ambulatory in stable condition. Alert and oriented x 3. F/U with [facility] as scheduled.

## 2021-06-26 NOTE — Patient Instructions (Signed)
[facility]  Discharge Instructions: Thank you for choosing [facility] to provide your oncology and hematology care.  If you have a lab appointment with the [facility], please come in thru the Main Entrance and check in at the main information desk.  Wear comfortable clothing and clothing appropriate for easy access to any Portacath or PICC line.   We strive to give you quality time with your provider. You may need to reschedule your appointment if you arrive late (15 or more minutes).  Arriving late affects you and other patients whose appointments are after yours.  Also, if you miss three or more appointments without notifying the office, you may be dismissed from the clinic at the provider's discretion.      For prescription refill requests, have your pharmacy contact our office and allow 72 hours for refills to be completed.    Today you received the following chemotherapy and/or immunotherapy agents Udenyca injection and pump disconnection.   To help prevent nausea and vomiting after your treatment, we encourage you to take your nausea medication as directed.  BELOW ARE SYMPTOMS THAT SHOULD BE REPORTED IMMEDIATELY: *FEVER GREATER THAN 100.4 F (38 C) OR HIGHER *CHILLS OR SWEATING *NAUSEA AND VOMITING THAT IS NOT CONTROLLED WITH YOUR NAUSEA MEDICATION *UNUSUAL SHORTNESS OF BREATH *UNUSUAL BRUISING OR BLEEDING *URINARY PROBLEMS (pain or burning when urinating, or frequent urination) *BOWEL PROBLEMS (unusual diarrhea, constipation, pain near the anus) TENDERNESS IN MOUTH AND THROAT WITH OR WITHOUT PRESENCE OF ULCERS (sore throat, sores in mouth, or a toothache) UNUSUAL RASH, SWELLING OR PAIN  UNUSUAL VAGINAL DISCHARGE OR ITCHING   Items with * indicate a potential emergency and should be followed up as soon as possible or go to the Emergency Department if any problems should occur.  Please show the CHEMOTHERAPY ALERT CARD or IMMUNOTHERAPY ALERT CARD at  check-in to the Emergency Department and triage nurse.  Should you have questions after your visit or need to cancel or reschedule your appointment, please contact [facility] [phone]  and follow the prompts.  Office hours are 8:00 a.m. to 4:30 p.m. Monday - Friday. Please note that voicemails left after 4:00 p.m. may not be returned until the following business day.  We are closed weekends and major holidays. You have access to a nurse at all times for urgent questions. Please call the main number to the clinic [phone] and follow the prompts.  For any non-urgent questions, you may also contact your provider using MyChart. We now offer e-Visits for anyone 66 and older to request care online for non-urgent symptoms. For details visit [email or web address].   Also download the MyChart app! Go to the app store, search "MyChart", open the app, select [facility], and log in with your MyChart username and password.  Due to Covid, a mask is required upon entering the hospital/clinic. If you do not have a mask, one will be given to you upon arrival. For doctor visits, patients may have 1 support person aged 34 or older with them. For treatment visits, patients cannot have anyone with them due to current Covid guidelines and our immunocompromised population.

## 2021-06-27 ENCOUNTER — Telehealth (HOSPITAL_COMMUNITY): Payer: Self-pay | Admitting: *Deleted

## 2021-06-27 NOTE — Telephone Encounter (Signed)
Pt called for 24 hour chemotherapy follow-up. Pt stated he has been experiencing nausea and fatigue. Pt is taking 1 10 mg compazine every 6 hours  as prescribed and 1 4mg   Zofran every 8 hours as needed for N&V. Pt advised to call the clinic if needed.

## 2021-06-30 ENCOUNTER — Telehealth (HOSPITAL_COMMUNITY): Payer: Self-pay | Admitting: Dietician

## 2021-06-30 NOTE — Telephone Encounter (Signed)
Nutrition  Attempted to contact patient and wife via telephone for nutrition follow-up, however no answer. Voice mailbox is full and unable to leave message. Will continue trying to contact patient for follow-up as able.

## 2021-07-03 ENCOUNTER — Telehealth (HOSPITAL_COMMUNITY): Payer: Self-pay | Admitting: *Deleted

## 2021-07-03 NOTE — Telephone Encounter (Signed)
Received TC from wife, [place] stating that he has been constipated x 3 days with breakthrough watery stool and abdominal pain.  Advised to start Colace 2 in the am and 2 in the pm, along with Miralax bid until he has a normal bowel movement and back off to 2 tablets in the am and add Miralax if needed.  Verbalized understanding.

## 2021-07-08 ENCOUNTER — Encounter (HOSPITAL_COMMUNITY): Payer: Self-pay | Admitting: General Practice

## 2021-07-08 NOTE — Progress Notes (Signed)
[facility] CSW Progress Notes  Call to wife to check in on progress.  They have been linked w [place] and are receiving assistance from this agency  They have heard from [place] re the [employer] program.  Per notes from [employer], he is not eligible for Medicaid but can apply for [employer] .  Message sent to [employer] to clarify whether she will assist with this process.  Message sent to [place] as patient would like to apply for [employer].  Wife is the only breadwinner in home as patient is unable to work at this time.  She is working reduced hours due to need to care for patient.  They are parents of 4 children.  CSW will call again in 2 weeks to assess progress.  Edwyna Shell, LCSW Clinical Social Worker Phone:  [phone]

## 2021-07-08 NOTE — Progress Notes (Signed)
[place], [place] [address]   CLINIC:  Medical Oncology/Hematology  PCP:  [facility] [address] / [place] [id number] [phone]   REASON FOR VISIT:  Follow-up for adenocarcinoma of the stomach  PRIOR THERAPY: none  NGS Results: not done  CURRENT THERAPY: Gastroesophageal FLOT every 2 weeks for 4 cycles  BRIEF ONCOLOGIC HISTORY:  Oncology History  Gastric cancer ([place])  06/19/2021 Initial Diagnosis   Gastric cancer ([place])   06/25/2021 -  Chemotherapy   Patient is on Treatment Plan : GASTROESOPHAGEAL FLOT q14d X 4 cycles       CANCER STAGING: Cancer Staging Gastric cancer ([place]) Staging form: Stomach, AJCC 8th Edition - Clinical stage from 06/19/2021: Stage III (cT3, cN1, cM0) - Unsigned   INTERVAL HISTORY:  Steven Martinez, a 30 y.o. male, returns for routine follow-up and consideration for next cycle of chemotherapy. Steven Martinez was last seen on 06/24/2021.  Due for cycle #2 of FLOT today.   Overall, he tells me he has been feeling pretty well, and he is accompanied by his wife. He reports nausea which was helped by compazine, he denies vomiting. He reports good appetite. He reports one episode of cold sensitivity. He denies tingling/numbness. He reports intermittent diarrhea and constipation. He drinks 2-3 boost daily along with solid meals. His abdominal pain has improved, and he does not currently require pain medications.   Overall, he feels ready for next cycle of chemo today.   REVIEW OF SYSTEMS:  Review of Systems  Constitutional:  Negative for appetite change and fatigue (75%).  Gastrointestinal:  Positive for constipation, diarrhea and nausea. Negative for abdominal pain (improved) and vomiting.  Neurological:  Positive for headaches. Negative for numbness.  All other systems reviewed and are negative.  PAST MEDICAL/SURGICAL HISTORY:  Past Medical History:  Diagnosis Date   Acute upper  GI bleed 10/13/2020   gastric ulcer with visible vessel   Alcohol abuse 10/13/2020   Quit Jan 2022   GERD (gastroesophageal reflux disease)    Port-A-Cath in place 06/23/2021   Positive H. pylori test 09/2020   H pylori IgG + Jan 2022 s/p treatment with Prevpac. EGD March 2022 with gastric biopsy negative for H. pylori.    Past Surgical History:  Procedure Laterality Date   BIOPSY  12/03/2020   Procedure: BIOPSY;  Surgeon: Eloise Harman, DO;  [facility];  Service: Endoscopy;;   BIOPSY  05/12/2021   Procedure: BIOPSY;  Surgeon: Eloise Harman, DO;  [facility];  Service: Endoscopy;;   ESOPHAGOGASTRODUODENOSCOPY (EGD) WITH PROPOFOL N/A 10/14/2020    Surgeon: Eloise Harman, DO;  one non-bleeding, non-obstructing gastric ulcer with visible vessel s/p bipolar cautery, normal examined esophagus and duodenum.    ESOPHAGOGASTRODUODENOSCOPY (EGD) WITH PROPOFOL N/A 12/03/2020    Surgeon: Eloise Harman, DO;   nonobstructing, nonbleeding gastric ulcer with clean base s/p biopsied, gastritis biopsied, normal examined duodenum.  Pathology with granulation tissue consistent with ulcer, mild chronic gastritis with focal intestinal metaplasia, H. pylori negative.  Repeat in 3 months.   ESOPHAGOGASTRODUODENOSCOPY (EGD) WITH PROPOFOL N/A 05/12/2021   Procedure: ESOPHAGOGASTRODUODENOSCOPY (EGD) WITH PROPOFOL;  Surgeon: Eloise Harman, DO;  [facility];  Service: Endoscopy;  Laterality: N/A;  10:30am   ESOPHAGOGASTRODUODENOSCOPY (EGD) WITH PROPOFOL N/A 06/19/2021   Procedure: ESOPHAGOGASTRODUODENOSCOPY (EGD) WITH PROPOFOL;  Surgeon: [name], MD;  [facility];  Service: Endoscopy;  Laterality: N/A;  EUS N/A 06/19/2021   Procedure: UPPER ENDOSCOPIC ULTRASOUND (EUS) RADIAL;  Surgeon: [name], MD;  Location: [facility];  Service: Endoscopy;  Laterality: N/A;   PORTACATH PLACEMENT Left 06/23/2021   Procedure: INSERTION PORT-A-CATH;   Surgeon: Aviva Signs, MD;  Location: [facility];  Service: General;  Laterality: Left;    SOCIAL HISTORY:  Social History   Socioeconomic History   Marital status: Married    Spouse name: Not on file   Number of children: Not on file   Years of education: Not on file   Highest education level: Not on file  Occupational History   Not on file  Tobacco Use   Smoking status: Never   Smokeless tobacco: Never  Vaping Use   Vaping Use: Never used  Substance and Sexual Activity   Alcohol use: Not Currently    Alcohol/week: 57.0 - 58.0 standard drinks    Types: 54 Cans of beer, 3 - 4 Shots of liquor per week    Comment: Quit January 2022. Used to drink sixpack daily and 12-15 beers on Saturdays and Sundays.    Drug use: Never   Sexual activity: Yes  Other Topics Concern   Not on file  Social History Narrative   Not on file   Social Determinants of Health   Financial Resource Strain: High Risk   Difficulty of Paying Living Expenses: Very hard  Food Insecurity: No Food Insecurity   Worried About Running Out of Food in the Last Year: Never true   Ran Out of Food in the Last Year: Never true  Transportation Needs: No Transportation Needs   Lack of Transportation (Medical): No   Lack of Transportation (Non-Medical): No  Physical Activity: Sufficiently Active   Days of Exercise per Week: 5 days   Minutes of Exercise per Session: 60 min  Stress: No Stress Concern Present   Feeling of Stress : Not at all  Social Connections: Moderately Isolated   Frequency of Communication with Friends and Family: More than three times a week   Frequency of Social Gatherings with Friends and Family: More than three times a week   Attends Religious Services: Never   Marine scientist or Organizations: No   Attends Music therapist: Never   Marital Status: Married  Human resources officer Violence: Not At Risk   Fear of Current or Ex-Partner: No   Emotionally Abused: No   Physically  Abused: No   Sexually Abused: No    FAMILY HISTORY:  Family History  Problem Relation Age of Onset   Diabetes Mellitus II Mother    Hypertension Mother    Colon cancer Neg Hx    Colon polyps Neg Hx     CURRENT MEDICATIONS:  Current Outpatient Medications  Medication Sig Dispense Refill   DOCETAXEL IV Inject into the vein every 14 (fourteen) days.     fluorouracil CALGB 54492 2,400 mg/m2 in sodium chloride 0.9 % 150 mL Inject 2,600 mg/m2 into the vein over 24 hr.     FLUOROURACIL IV Inject into the vein every 14 (fourteen) days.     HYDROcodone-acetaminophen (NORCO) 5-325 MG tablet Take 1 tablet by mouth every 4 (four) hours as needed for moderate pain. 20 tablet 0   lidocaine-prilocaine (EMLA) cream Apply a small amount to port a cath site and cover with plastic wrap 1 hour prior to chemotherapy appointments 30 g 3   Multiple Vitamin (MULTIVITAMIN WITH MINERALS) TABS tablet Take 1 tablet by mouth in the morning.  OXALIPLATIN IV Inject into the vein every 14 (fourteen) days.     pantoprazole (PROTONIX) 40 MG tablet 1 tablet     prochlorperazine (COMPAZINE) 10 MG tablet Take 1 tablet (10 mg total) by mouth every 6 (six) hours as needed (Nausea or vomiting). 30 tablet 1   sucralfate (CARAFATE) 1 GM/10ML suspension Take 10 mLs (1 g total) by mouth 4 (four) times daily -  with meals and at bedtime. 420 mL 0   No current facility-administered medications for this visit.    ALLERGIES:  Not on File  PHYSICAL EXAM:  Performance status (ECOG): 0 - Asymptomatic  There were no vitals filed for this visit. Wt Readings from Last 3 Encounters:  06/25/21 160 lb 9.6 oz (72.8 kg)  06/24/21 158 lb 12.8 oz (72 kg)  06/23/21 156 lb 15.5 oz (71.2 kg)   Physical Exam Vitals reviewed.  Constitutional:      Appearance: Normal appearance.  Cardiovascular:     Rate and Rhythm: Normal rate and regular rhythm.     Pulses: Normal pulses.     Heart sounds: Normal heart sounds.  Pulmonary:      Effort: Pulmonary effort is normal.     Breath sounds: Normal breath sounds.  Abdominal:     Palpations: Abdomen is soft. There is no hepatomegaly, splenomegaly or mass.     Tenderness: There is no abdominal tenderness.  Neurological:     General: No focal deficit present.     Mental Status: He is alert and oriented to person, place, and time.  Psychiatric:        Mood and Affect: Mood normal.        Behavior: Behavior normal.    LABORATORY DATA:  I have reviewed the labs as listed.  CBC Latest Ref Rng & Units 06/25/2021 06/19/2021 04/14/2021  WBC 4.0 - 10.5 K/uL 5.8 4.9 4.6  Hemoglobin 13.0 - 17.0 g/dL 9.8(L) 10.3(L) 14.9  Hematocrit 39.0 - 52.0 % 30.2(L) 32.0(L) 45.4  Platelets 150 - 400 K/uL 268 275 268   CMP Latest Ref Rng & Units 06/25/2021 06/19/2021 05/07/2021  Glucose 70 - 99 mg/dL 166(H) 116(H) 86  BUN 6 - 20 mg/dL 16 10 14   Creatinine 0.61 - 1.24 mg/dL 0.79 0.71 0.84  Sodium 135 - 145 mmol/L 137 137 137  Potassium 3.5 - 5.1 mmol/L 3.8 3.7 3.8  Chloride 98 - 111 mmol/L 103 105 104  CO2 22 - 32 mmol/L 27 25 28   Calcium 8.9 - 10.3 mg/dL 8.4(L) 8.6(L) 8.8(L)  Total Protein 6.5 - 8.1 g/dL 6.9 7.0 7.1  Total Bilirubin 0.3 - 1.2 mg/dL 0.5 0.3 0.6  Alkaline Phos 38 - 126 U/L 75 78 75  AST 15 - 41 U/L 25 20 21   ALT 0 - 44 U/L 19 21 22     DIAGNOSTIC IMAGING:  I have independently reviewed the scans and discussed with the patient. DG Chest Port 1 View  Result Date: 06/23/2021 CLINICAL DATA:  Post Port-A-Cath placement EXAM: PORTABLE CHEST 1 VIEW COMPARISON:  01/23/2020 FINDINGS: Interval placement of left chest port with catheter tip overlying the SVC. Normal cardiac and mediastinal contours. No focal pulmonary opacity. No pleural effusion pneumothorax. No acute osseous abnormality. IMPRESSION: 1. Status post placement of a left chest port with catheter tip overlying the SVC. 2. No active cardiopulmonary disease. Electronically Signed   By: Merilyn Baba M.D.   On: 06/23/2021 13:19    DG C-Arm 1-60 Min-No Report  Result Date: 06/23/2021 Fluoroscopy was utilized  by the requesting physician.  No radiographic interpretation.     ASSESSMENT:  1.  Gastric antral adenocarcinoma: - EGD on 05/12/2021 gastric antral mass with large cratered ulcer.  Normal duodenal bulb and first part of the duodenum. - Biopsy of the antrum ulcer consistent with adenocarcinoma, at least intramucosal.  Depth of invasion cannot be judged as the biopsy was superficial. - CT CAP on 05/14/2021 with gastric antral soft tissue thickening/mass measuring 3.7 x 3 cm.  1.2 cm portacaval lymph node.  Node within the perigastric fat anterior to the pylorus measures 5 mm.  No evidence of distant metastatic disease in the abdomen or pelvis.  Isolated 4 mm right middle lobe lung nodule most likely incidental/benign. - 13 pound weight loss in the last 3 months. - PET scan on 05/29/2021 showed mass in the gastric antrum FDG avid with SUV 5.32.  No definite evidence of FDG avid nodal metastasis or distant metastatic disease.  There is only low-level FDG uptake associated with previously described prominent upper abdominal lymph nodes. - EGD/EUS on 06/19/2021-uT3N1 4 cm cratered noncircumferential distal gastric adenocarcinoma.  There is one suspicious 9 mm perigastric lymph node nearby the primary gastric mass.  2.  Social/family history: - He is married and seen with his wife today. - He does Architect work. - He quit smoking cigarettes 13 years ago.  Quit drinking alcohol in March 2022. - No family history of malignancies.   PLAN:  1.  Stage III (uT3 N1) gastric adenocarcinoma: - He had nausea after first cycle but denied any vomiting.  Compazine helped. - He felt tired for 3 to 4 days after first cycle.  Second week was better. - Denies any tingling or numbness.  Had 1 episode of cold sensitivity. - Reviewed labs today which showed normal CBC.  Hemoglobin is 10.3 with MCV 78.  LFTs are normal. - He will proceed  with cycle 2 without any dose modifications.  RTC 2 weeks for follow-up for cycle 3.  If he is tolerating well, we will plan for all 8 cycles prior to surgery.  2.  Weight loss: - He lost 13 pounds in the last 3 months. - He started eating better after first cycle.  He is drinking about 2 to 3 cans of boost per day. - He gained about 1 pound in the last 2 weeks.  3.  Abdominal pain: - He had abdominal pain in the mornings around 2 AM which have gotten better after eating. - Since the first cycle of chemotherapy, he reports improvement in abdominal pain.  He did not require any pain medication.  4.  Constipation: - He was told to start stool softener daily and hold it if he had diarrhea.  5.  Normocytic anemia: - We will check ferritin, iron panel, C58 and folic acid.   Orders placed this encounter:  No orders of the defined types were placed in this encounter.    Derek Jack, MD [facility] [phone]   I, Thana Ates, am acting as a scribe for Dr. Derek Jack.  I, Derek Jack MD, have reviewed the above documentation for accuracy and completeness, and I agree with the above.

## 2021-07-09 ENCOUNTER — Other Ambulatory Visit (HOSPITAL_COMMUNITY): Payer: Self-pay

## 2021-07-09 ENCOUNTER — Inpatient Hospital Stay (HOSPITAL_COMMUNITY): Payer: 59

## 2021-07-09 ENCOUNTER — Other Ambulatory Visit: Payer: Self-pay

## 2021-07-09 ENCOUNTER — Inpatient Hospital Stay (HOSPITAL_BASED_OUTPATIENT_CLINIC_OR_DEPARTMENT_OTHER): Payer: 59 | Admitting: Hematology

## 2021-07-09 VITALS — BP 128/82 | HR 74 | Temp 96.9°F | Resp 18

## 2021-07-09 DIAGNOSIS — C163 Malignant neoplasm of pyloric antrum: Secondary | ICD-10-CM

## 2021-07-09 DIAGNOSIS — C169 Malignant neoplasm of stomach, unspecified: Secondary | ICD-10-CM

## 2021-07-09 DIAGNOSIS — Z95828 Presence of other vascular implants and grafts: Secondary | ICD-10-CM

## 2021-07-09 DIAGNOSIS — Z5111 Encounter for antineoplastic chemotherapy: Secondary | ICD-10-CM | POA: Diagnosis not present

## 2021-07-09 LAB — COMPREHENSIVE METABOLIC PANEL
ALT: 32 U/L (ref 0–44)
AST: 24 U/L (ref 15–41)
Albumin: 3.9 g/dL (ref 3.5–5.0)
Alkaline Phosphatase: 92 U/L (ref 38–126)
Anion gap: 6 (ref 5–15)
BUN: 14 mg/dL (ref 6–20)
CO2: 27 mmol/L (ref 22–32)
Calcium: 8.7 mg/dL — ABNORMAL LOW (ref 8.9–10.3)
Chloride: 103 mmol/L (ref 98–111)
Creatinine, Ser: 0.75 mg/dL (ref 0.61–1.24)
GFR, Estimated: >60 mL/min (ref >60)
Glucose, Bld: 122 mg/dL — ABNORMAL HIGH (ref 70–99)
Potassium: 3.7 mmol/L (ref 3.5–5.1)
Sodium: 136 mmol/L (ref 135–145)
Total Bilirubin: 0.2 mg/dL — ABNORMAL LOW (ref 0.3–1.2)
Total Protein: 7.3 g/dL (ref 6.5–8.1)

## 2021-07-09 LAB — CBC WITH DIFFERENTIAL/PLATELET
Abs Immature Granulocytes: 0.13 10*3/uL — ABNORMAL HIGH (ref 0.00–0.07)
Basophils Absolute: 0.1 10*3/uL (ref 0.0–0.1)
Basophils Relative: 1 %
Eosinophils Absolute: 0.1 10*3/uL (ref 0.0–0.5)
Eosinophils Relative: 1 %
HCT: 32.9 % — ABNORMAL LOW (ref 39.0–52.0)
Hemoglobin: 10.3 g/dL — ABNORMAL LOW (ref 13.0–17.0)
Immature Granulocytes: 2 %
Lymphocytes Relative: 37 %
Lymphs Abs: 2.4 10*3/uL (ref 0.7–4.0)
MCH: 24.4 pg — ABNORMAL LOW (ref 26.0–34.0)
MCHC: 31.3 g/dL (ref 30.0–36.0)
MCV: 78 fL — ABNORMAL LOW (ref 80.0–100.0)
Monocytes Absolute: 0.4 10*3/uL (ref 0.1–1.0)
Monocytes Relative: 6 %
Neutro Abs: 3.3 10*3/uL (ref 1.7–7.7)
Neutrophils Relative %: 53 %
Platelets: 299 10*3/uL (ref 150–400)
RBC: 4.22 MIL/uL (ref 4.22–5.81)
RDW: 13.5 % (ref 11.5–15.5)
WBC: 6.4 10*3/uL (ref 4.0–10.5)
nRBC: 0 % (ref 0.0–0.2)

## 2021-07-09 MED ORDER — DEXTROSE 5 % IV SOLN: Freq: Once | INTRAVENOUS | Status: AC

## 2021-07-09 MED ORDER — OXALIPLATIN CHEMO INJECTION 100 MG/20ML
83.0000 mg/m2 | Freq: Once | INTRAVENOUS | Status: AC
  Administered 2021-07-09: 150 mg via INTRAVENOUS
  Filled 2021-07-09: qty 20

## 2021-07-09 MED ORDER — SODIUM CHLORIDE 0.9% FLUSH
10.0000 mL | INTRAVENOUS | Status: DC | PRN
  Administered 2021-07-09: 10 mL

## 2021-07-09 MED ORDER — ONDANSETRON 4 MG PO TBDP: 4.0000 mg | ORAL_TABLET | Freq: Three times a day (TID) | ORAL | 6 refills | Status: DC | PRN

## 2021-07-09 MED ORDER — SODIUM CHLORIDE 0.9 % IV SOLN
5000.0000 mg | INTRAVENOUS | Status: DC
  Administered 2021-07-09: 5000 mg via INTRAVENOUS
  Filled 2021-07-09: qty 100

## 2021-07-09 MED ORDER — PALONOSETRON HCL INJECTION 0.25 MG/5ML
0.2500 mg | Freq: Once | INTRAVENOUS | Status: AC
  Administered 2021-07-09: 0.25 mg via INTRAVENOUS
  Filled 2021-07-09: qty 5

## 2021-07-09 MED ORDER — SODIUM CHLORIDE 0.9 % IV SOLN
10.0000 mg | Freq: Once | INTRAVENOUS | Status: AC
  Administered 2021-07-09: 10 mg via INTRAVENOUS
  Filled 2021-07-09: qty 10

## 2021-07-09 MED ORDER — SODIUM CHLORIDE 0.9 % IV SOLN
50.0000 mg/m2 | Freq: Once | INTRAVENOUS | Status: AC
  Administered 2021-07-09: 90 mg via INTRAVENOUS
  Filled 2021-07-09: qty 9

## 2021-07-09 MED ORDER — LEUCOVORIN CALCIUM INJECTION 350 MG
200.0000 mg/m2 | Freq: Once | INTRAVENOUS | Status: AC
  Administered 2021-07-09: 366 mg via INTRAVENOUS
  Filled 2021-07-09: qty 18.3

## 2021-07-09 NOTE — Progress Notes (Signed)
Patient has been assessed, vital signs and labs have been reviewed by Dr. Katragadda. ANC, Creatinine, LFTs, and Platelets are within treatment parameters per Dr. Katragadda. The patient is good to proceed with treatment at this time. Primary RN and pharmacy aware.  

## 2021-07-09 NOTE — Progress Notes (Signed)
Patient presents today for FLOT. Patient complains of nausea at home, a prescription for Zofran was sent into patient's pharmacy via verbal orders from Dr. Delton Coombes.  Patient tolerated chemotherapy with no complaints voiced. Side effects with management reviewed understanding verbalized. Port site clean and dry with no bruising or swelling noted at site. Good blood return noted before and after administration of chemotherapy. Chemo pump connected with no alarms noted. Patient left in satisfactory condition with VSS and no s/s of distress noted.

## 2021-07-09 NOTE — Patient Instructions (Signed)
[place] at [facility] Discharge Instructions  You were seen and examined today by Dr. Delton Coombes. Use stool softener twice daily for 3-4 days if you have constipation. If starts having diarrhea discontinue use of stool softener. If has diarrhea after treatment use imodium.  Please follow up as scheduled.   Thank you for choosing [facility] at [facility] to provide your oncology and hematology care.  To afford each patient quality time with our provider, please arrive at least 15 minutes before your scheduled appointment time.   If you have a lab appointment with the [facility] please come in thru the Main Entrance and check in at the main information desk.  You need to re-schedule your appointment should you arrive 10 or more minutes late.  We strive to give you quality time with our providers, and arriving late affects you and other patients whose appointments are after yours.  Also, if you no show three or more times for appointments you may be dismissed from the clinic at the providers discretion.     Again, thank you for choosing [facility].  Our hope is that these requests will decrease the amount of time that you wait before being seen by our physicians.       _____________________________________________________________  Should you have questions after your visit to [facility], please contact our office at [phone] and follow the prompts.  Our office hours are 8:00 a.m. and 4:30 p.m. Monday - Friday.  Please note that voicemails left after 4:00 p.m. may not be returned until the following business day.  We are closed weekends and major holidays.  You do have access to a nurse 24-7, just call the main number to the clinic [phone] and do not press any options, hold on the line and a nurse will answer the phone.    For prescription refill requests, have your pharmacy contact our office and allow 72 hours.     Due to Covid, you will need to wear a mask upon entering the hospital. If you do not have a mask, a mask will be given to you at the Main Entrance upon arrival. For doctor visits, patients may have 1 support person age 64 or older with them. For treatment visits, patients can not have anyone with them due to social distancing guidelines and our immunocompromised population.

## 2021-07-09 NOTE — Patient Instructions (Signed)
[facility]  Discharge Instructions: Thank you for choosing [facility] to provide your oncology and hematology care.  If you have a lab appointment with the [facility], please come in thru the Main Entrance and check in at the main information desk.  Wear comfortable clothing and clothing appropriate for easy access to any Portacath or PICC line.   We strive to give you quality time with your provider. You may need to reschedule your appointment if you arrive late (15 or more minutes).  Arriving late affects you and other patients whose appointments are after yours.  Also, if you miss three or more appointments without notifying the office, you may be dismissed from the clinic at the provider's discretion.      For prescription refill requests, have your pharmacy contact our office and allow 72 hours for refills to be completed.    Today you received the following chemotherapy and/or immunotherapy agents: Docetaxel, oxaliplatin, leucovorin and 5FU pump. A prescription for Zofran was sent into your pharmacy. Return as scheduled.    To help prevent nausea and vomiting after your treatment, we encourage you to take your nausea medication as directed.  BELOW ARE SYMPTOMS THAT SHOULD BE REPORTED IMMEDIATELY: *FEVER GREATER THAN 100.4 F (38 C) OR HIGHER *CHILLS OR SWEATING *NAUSEA AND VOMITING THAT IS NOT CONTROLLED WITH YOUR NAUSEA MEDICATION *UNUSUAL SHORTNESS OF BREATH *UNUSUAL BRUISING OR BLEEDING *URINARY PROBLEMS (pain or burning when urinating, or frequent urination) *BOWEL PROBLEMS (unusual diarrhea, constipation, pain near the anus) TENDERNESS IN MOUTH AND THROAT WITH OR WITHOUT PRESENCE OF ULCERS (sore throat, sores in mouth, or a toothache) UNUSUAL RASH, SWELLING OR PAIN  UNUSUAL VAGINAL DISCHARGE OR ITCHING   Items with * indicate a potential emergency and should be followed up as soon as possible or go to the Emergency Department if any problems should  occur.  Please show the CHEMOTHERAPY ALERT CARD or IMMUNOTHERAPY ALERT CARD at check-in to the Emergency Department and triage nurse.  Should you have questions after your visit or need to cancel or reschedule your appointment, please contact [facility] [phone]  and follow the prompts.  Office hours are 8:00 a.m. to 4:30 p.m. Monday - Friday. Please note that voicemails left after 4:00 p.m. may not be returned until the following business day.  We are closed weekends and major holidays. You have access to a nurse at all times for urgent questions. Please call the main number to the clinic [phone] and follow the prompts.  For any non-urgent questions, you may also contact your provider using MyChart. We now offer e-Visits for anyone 2 and older to request care online for non-urgent symptoms. For details visit [email or web address].   Also download the MyChart app! Go to the app store, search "MyChart", open the app, select [facility], and log in with your MyChart username and password.  Due to Covid, a mask is required upon entering the hospital/clinic. If you do not have a mask, one will be given to you upon arrival. For doctor visits, patients may have 1 support person aged 45 or older with them. For treatment visits, patients cannot have anyone with them due to current Covid guidelines and our immunocompromised population.

## 2021-07-09 NOTE — Progress Notes (Signed)
Port flushed with good blood return noted. No bruising or swelling at site. Patient awaiting labs for treatment.  

## 2021-07-10 ENCOUNTER — Inpatient Hospital Stay (HOSPITAL_COMMUNITY): Payer: 59

## 2021-07-10 ENCOUNTER — Telehealth (HOSPITAL_COMMUNITY): Payer: Self-pay | Admitting: Hematology

## 2021-07-10 ENCOUNTER — Encounter (HOSPITAL_COMMUNITY): Payer: Self-pay

## 2021-07-10 VITALS — BP 125/86 | HR 87 | Temp 97.7°F | Resp 18

## 2021-07-10 DIAGNOSIS — C163 Malignant neoplasm of pyloric antrum: Secondary | ICD-10-CM

## 2021-07-10 DIAGNOSIS — Z95828 Presence of other vascular implants and grafts: Secondary | ICD-10-CM

## 2021-07-10 DIAGNOSIS — Z5111 Encounter for antineoplastic chemotherapy: Secondary | ICD-10-CM | POA: Diagnosis not present

## 2021-07-10 MED ORDER — SODIUM CHLORIDE 0.9% FLUSH
10.0000 mL | INTRAVENOUS | Status: DC | PRN
  Administered 2021-07-10: 10 mL

## 2021-07-10 MED ORDER — HEPARIN SOD (PORK) LOCK FLUSH 100 UNIT/ML IV SOLN
500.0000 [IU] | Freq: Once | INTRAVENOUS | Status: AC | PRN
  Administered 2021-07-10: 500 [IU]

## 2021-07-10 MED ORDER — PEGFILGRASTIM-CBQV 6 MG/0.6ML ~~LOC~~ SOSY
6.0000 mg | PREFILLED_SYRINGE | Freq: Once | SUBCUTANEOUS | Status: AC
  Administered 2021-07-10: 6 mg via SUBCUTANEOUS
  Filled 2021-07-10: qty 0.6

## 2021-07-10 NOTE — Telephone Encounter (Signed)
Enrolled pt into [employer].  Pt is out of work and wife is only able to work pt. Couple has 4 kids. They requested Visa cards to help with food and gas and also submitted [employer] and [employer] .

## 2021-07-10 NOTE — Progress Notes (Signed)
Patient presents today for pump d/c and udenyca injection. Port flushed with good blood return noted. No bruising or swelling at site. Bandaid applied.  Patient tolerated injection with no complaints voiced. Site clean and dry with no bruising or swelling noted at site. See MAR for details. Band aid applied.  Patient stable during and after injection. VSS with discharge and left in satisfactory condition with no s/s of distress noted.

## 2021-07-10 NOTE — Patient Instructions (Signed)
[facility]  Discharge Instructions: Thank you for choosing [facility] to provide your oncology and hematology care.  If you have a lab appointment with the [facility], please come in thru the Main Entrance and check in at the main information desk.  Wear comfortable clothing and clothing appropriate for easy access to any Portacath or PICC line.   We strive to give you quality time with your provider. You may need to reschedule your appointment if you arrive late (15 or more minutes).  Arriving late affects you and other patients whose appointments are after yours.  Also, if you miss three or more appointments without notifying the office, you may be dismissed from the clinic at the provider's discretion.      For prescription refill requests, have your pharmacy contact our office and allow 72 hours for refills to be completed.    Today you received the following Udencya and your chemo pump was removed.   To help prevent nausea and vomiting after your treatment, we encourage you to take your nausea medication as directed.  BELOW ARE SYMPTOMS THAT SHOULD BE REPORTED IMMEDIATELY: *FEVER GREATER THAN 100.4 F (38 C) OR HIGHER *CHILLS OR SWEATING *NAUSEA AND VOMITING THAT IS NOT CONTROLLED WITH YOUR NAUSEA MEDICATION *UNUSUAL SHORTNESS OF BREATH *UNUSUAL BRUISING OR BLEEDING *URINARY PROBLEMS (pain or burning when urinating, or frequent urination) *BOWEL PROBLEMS (unusual diarrhea, constipation, pain near the anus) TENDERNESS IN MOUTH AND THROAT WITH OR WITHOUT PRESENCE OF ULCERS (sore throat, sores in mouth, or a toothache) UNUSUAL RASH, SWELLING OR PAIN  UNUSUAL VAGINAL DISCHARGE OR ITCHING   Items with * indicate a potential emergency and should be followed up as soon as possible or go to the Emergency Department if any problems should occur.  Please show the CHEMOTHERAPY ALERT CARD or IMMUNOTHERAPY ALERT CARD at check-in to the Emergency Department and  triage nurse.  Should you have questions after your visit or need to cancel or reschedule your appointment, please contact [facility] [phone]  and follow the prompts.  Office hours are 8:00 a.m. to 4:30 p.m. Monday - Friday. Please note that voicemails left after 4:00 p.m. may not be returned until the following business day.  We are closed weekends and major holidays. You have access to a nurse at all times for urgent questions. Please call the main number to the clinic [phone] and follow the prompts.  For any non-urgent questions, you may also contact your provider using MyChart. We now offer e-Visits for anyone 30 and older to request care online for non-urgent symptoms. For details visit [email or web address].   Also download the MyChart app! Go to the app store, search "MyChart", open the app, select [facility], and log in with your MyChart username and password.  Due to Covid, a mask is required upon entering the hospital/clinic. If you do not have a mask, one will be given to you upon arrival. For doctor visits, patients may have 1 support person aged 30 or older with them. For treatment visits, patients cannot have anyone with them due to current Covid guidelines and our immunocompromised population.

## 2021-07-11 ENCOUNTER — Encounter (HOSPITAL_COMMUNITY): Payer: 59

## 2021-07-22 ENCOUNTER — Inpatient Hospital Stay (HOSPITAL_COMMUNITY): Payer: 59 | Attending: Hematology | Admitting: General Practice

## 2021-07-22 DIAGNOSIS — D649 Anemia, unspecified: Secondary | ICD-10-CM | POA: Insufficient documentation

## 2021-07-22 DIAGNOSIS — C163 Malignant neoplasm of pyloric antrum: Secondary | ICD-10-CM

## 2021-07-22 DIAGNOSIS — R110 Nausea: Secondary | ICD-10-CM | POA: Insufficient documentation

## 2021-07-22 DIAGNOSIS — Z79899 Other long term (current) drug therapy: Secondary | ICD-10-CM | POA: Insufficient documentation

## 2021-07-22 DIAGNOSIS — Z5111 Encounter for antineoplastic chemotherapy: Secondary | ICD-10-CM | POA: Insufficient documentation

## 2021-07-22 DIAGNOSIS — F1019 Alcohol abuse with unspecified alcohol-induced disorder: Secondary | ICD-10-CM | POA: Insufficient documentation

## 2021-07-22 DIAGNOSIS — Z87891 Personal history of nicotine dependence: Secondary | ICD-10-CM | POA: Insufficient documentation

## 2021-07-22 DIAGNOSIS — K5900 Constipation, unspecified: Secondary | ICD-10-CM | POA: Insufficient documentation

## 2021-07-22 DIAGNOSIS — C169 Malignant neoplasm of stomach, unspecified: Secondary | ICD-10-CM | POA: Insufficient documentation

## 2021-07-22 NOTE — Progress Notes (Signed)
[facility] CSW Progress Notes  Call to wife to check on progress towards goals of reducing financial stress.  They  have been linked w [facility], is awaiting phone appointment from [place] to continue application process.  He is not eligible for Medicaid, he is in process of applying for [facility] Financial Assistance to assist with medical bills.  He now has health insurance through his wife.  He has received help from [employer] via [facility].   He is also receiving help from [facility].  Wife is currently working as much as she can to bring income into the home, as well as care for husband and children. Will call her tomorrow for phone visit for caregiver support.  Edwyna Shell, LCSW Clinical Social Worker Phone:  [phone]

## 2021-07-22 NOTE — Progress Notes (Signed)
[place], [place] [address]   CLINIC:  Medical Oncology/Hematology  PCP:  [address] / [place] [address] [phone]   REASON FOR VISIT:  Follow-up for adenocarcinoma of the stomach  PRIOR THERAPY: none  NGS Results: not done  CURRENT THERAPY: Gastroesophageal FLOT every 2 weeks for 4 cycles  BRIEF ONCOLOGIC HISTORY:  Oncology History  Gastric cancer ([place])  06/19/2021 Initial Diagnosis   Gastric cancer ([place])   06/25/2021 -  Chemotherapy   Patient is on Treatment Plan : GASTROESOPHAGEAL FLOT q14d X 4 cycles       CANCER STAGING: Cancer Staging Gastric cancer ([place]) Staging form: Stomach, AJCC 8th Edition - Clinical stage from 06/19/2021: Stage III (cT3, cN1, cM0) - Unsigned   INTERVAL HISTORY:  [name], a 30 y.o. male, returns for routine follow-up and consideration for next cycle of chemotherapy. Eaton was last seen on 07/09/2021.  Due for cycle #3 of FLOT today.   Overall, he tells me he has been feeling pretty well, and he is accompanied by his wife who is acting as interpreter. He reports nausea for 2 days following treatment for which he is taking Zofran, and soft diarrhea for 2 days following treatment for which he is taking Imodium, but he denies vomiting. He denies tingling/numbness, cold sensitivity, and abdominal pain. He reports mild sore throat in the morning.   Overall, he feels ready for next cycle of chemo today.   REVIEW OF SYSTEMS:  Review of Systems  Constitutional:  Negative for appetite change and fatigue.  HENT:   Positive for sore throat.   Gastrointestinal:  Positive for diarrhea and nausea. Negative for abdominal pain and vomiting.  Neurological:  Negative for numbness.  Psychiatric/Behavioral:  The patient is nervous/anxious.   All other systems reviewed and are negative.  PAST MEDICAL/SURGICAL HISTORY:  Past Medical History:  Diagnosis Date    Acute upper GI bleed 10/13/2020   gastric ulcer with visible vessel   Alcohol abuse 10/13/2020   Quit Jan 2022   GERD (gastroesophageal reflux disease)    Port-A-Cath in place 06/23/2021   Positive H. pylori test 09/2020   H pylori IgG + Jan 2022 s/p treatment with Prevpac. EGD March 2022 with gastric biopsy negative for H. pylori.    Past Surgical History:  Procedure Laterality Date   BIOPSY  12/03/2020   Procedure: BIOPSY;  Surgeon: Eloise Harman, DO;  [facility];  Service: Endoscopy;;   BIOPSY  05/12/2021   Procedure: BIOPSY;  Surgeon: Eloise Harman, DO;  [facility];  Service: Endoscopy;;   ESOPHAGOGASTRODUODENOSCOPY (EGD) WITH PROPOFOL N/A 10/14/2020    Surgeon: Eloise Harman, DO;  one non-bleeding, non-obstructing gastric ulcer with visible vessel s/p bipolar cautery, normal examined esophagus and duodenum.    ESOPHAGOGASTRODUODENOSCOPY (EGD) WITH PROPOFOL N/A 12/03/2020    Surgeon: Eloise Harman, DO;   nonobstructing, nonbleeding gastric ulcer with clean base s/p biopsied, gastritis biopsied, normal examined duodenum.  Pathology with granulation tissue consistent with ulcer, mild chronic gastritis with focal intestinal metaplasia, H. pylori negative.  Repeat in 3 months.   ESOPHAGOGASTRODUODENOSCOPY (EGD) WITH PROPOFOL N/A 05/12/2021   Procedure: ESOPHAGOGASTRODUODENOSCOPY (EGD) WITH PROPOFOL;  Surgeon: Eloise Harman, DO;  [facility];  Service: Endoscopy;  Laterality: N/A;  10:30am   ESOPHAGOGASTRODUODENOSCOPY (EGD) WITH PROPOFOL N/A 06/19/2021   Procedure: ESOPHAGOGASTRODUODENOSCOPY (EGD) WITH PROPOFOL;  Surgeon: [name],  MD;  [facility];  Service: Endoscopy;  Laterality: N/A;   EUS N/A 06/19/2021   Procedure: UPPER ENDOSCOPIC ULTRASOUND (EUS) RADIAL;  Surgeon: [name], MD;  Location: [facility];  Service: Endoscopy;  Laterality: N/A;   PORTACATH PLACEMENT Left 06/23/2021   Procedure: INSERTION  PORT-A-CATH;  Surgeon: Aviva Signs, MD;  Location: [facility];  Service: General;  Laterality: Left;    SOCIAL HISTORY:  Social History   Socioeconomic History   Marital status: Married    Spouse name: Not on file   Number of children: Not on file   Years of education: Not on file   Highest education level: Not on file  Occupational History   Not on file  Tobacco Use   Smoking status: Never   Smokeless tobacco: Never  Vaping Use   Vaping Use: Never used  Substance and Sexual Activity   Alcohol use: Not Currently    Alcohol/week: 57.0 - 58.0 standard drinks    Types: 54 Cans of beer, 3 - 4 Shots of liquor per week    Comment: Quit January 2022. Used to drink sixpack daily and 12-15 beers on Saturdays and Sundays.    Drug use: Never   Sexual activity: Yes  Other Topics Concern   Not on file  Social History Narrative   Not on file   Social Determinants of Health   Financial Resource Strain: High Risk   Difficulty of Paying Living Expenses: Very hard  Food Insecurity: No Food Insecurity   Worried About Running Out of Food in the Last Year: Never true   Ran Out of Food in the Last Year: Never true  Transportation Needs: No Transportation Needs   Lack of Transportation (Medical): No   Lack of Transportation (Non-Medical): No  Physical Activity: Sufficiently Active   Days of Exercise per Week: 5 days   Minutes of Exercise per Session: 60 min  Stress: No Stress Concern Present   Feeling of Stress : Not at all  Social Connections: Moderately Isolated   Frequency of Communication with Friends and Family: More than three times a week   Frequency of Social Gatherings with Friends and Family: More than three times a week   Attends Religious Services: Never   Marine scientist or Organizations: No   Attends Music therapist: Never   Marital Status: Married  Human resources officer Violence: Not At Risk   Fear of Current or Ex-Partner: No   Emotionally Abused: No    Physically Abused: No   Sexually Abused: No    FAMILY HISTORY:  Family History  Problem Relation Age of Onset   Diabetes Mellitus II Mother    Hypertension Mother    Colon cancer Neg Hx    Colon polyps Neg Hx     CURRENT MEDICATIONS:  Current Outpatient Medications  Medication Sig Dispense Refill   DOCETAXEL IV Inject into the vein every 14 (fourteen) days.     fluorouracil CALGB 19622 2,400 mg/m2 in sodium chloride 0.9 % 150 mL Inject 2,600 mg/m2 into the vein over 24 hr.     FLUOROURACIL IV Inject into the vein every 14 (fourteen) days.     HYDROcodone-acetaminophen (NORCO) 5-325 MG tablet Take 1 tablet by mouth every 4 (four) hours as needed for moderate pain. (Patient not taking: No sig reported) 20 tablet 0   lidocaine-prilocaine (EMLA) cream Apply a small amount to port a cath site and cover with plastic wrap 1 hour prior to chemotherapy appointments 30  g 3   Multiple Vitamin (MULTIVITAMIN WITH MINERALS) TABS tablet Take 1 tablet by mouth in the morning.     ondansetron (ZOFRAN ODT) 4 MG disintegrating tablet Take 1 tablet (4 mg total) by mouth every 8 (eight) hours as needed for nausea or vomiting. 60 tablet 6   OXALIPLATIN IV Inject into the vein every 14 (fourteen) days.     pantoprazole (PROTONIX) 40 MG tablet 1 tablet     prochlorperazine (COMPAZINE) 10 MG tablet Take 1 tablet (10 mg total) by mouth every 6 (six) hours as needed (Nausea or vomiting). 30 tablet 1   sucralfate (CARAFATE) 1 GM/10ML suspension Take 10 mLs (1 g total) by mouth 4 (four) times daily -  with meals and at bedtime. 420 mL 0   No current facility-administered medications for this visit.    ALLERGIES:  Not on File  PHYSICAL EXAM:  Performance status (ECOG): 0 - Asymptomatic  There were no vitals filed for this visit. Wt Readings from Last 3 Encounters:  07/09/21 159 lb 6.4 oz (72.3 kg)  06/25/21 160 lb 9.6 oz (72.8 kg)  06/24/21 158 lb 12.8 oz (72 kg)   Physical Exam Vitals reviewed.   Constitutional:      Appearance: Normal appearance.  Cardiovascular:     Rate and Rhythm: Normal rate and regular rhythm.     Pulses: Normal pulses.     Heart sounds: Normal heart sounds.  Pulmonary:     Effort: Pulmonary effort is normal.     Breath sounds: Normal breath sounds.  Abdominal:     Palpations: Abdomen is soft. There is no mass.     Tenderness: There is no abdominal tenderness.  Musculoskeletal:     Right lower leg: No edema.     Left lower leg: No edema.  Neurological:     General: No focal deficit present.     Mental Status: He is alert and oriented to person, place, and time.  Psychiatric:        Mood and Affect: Mood normal.        Behavior: Behavior normal.    LABORATORY DATA:  I have reviewed the labs as listed.  CBC Latest Ref Rng & Units 07/09/2021 06/25/2021 06/19/2021  WBC 4.0 - 10.5 K/uL 6.4 5.8 4.9  Hemoglobin 13.0 - 17.0 g/dL 10.3(L) 9.8(L) 10.3(L)  Hematocrit 39.0 - 52.0 % 32.9(L) 30.2(L) 32.0(L)  Platelets 150 - 400 K/uL 299 268 275   CMP Latest Ref Rng & Units 07/09/2021 06/25/2021 06/19/2021  Glucose 70 - 99 mg/dL 122(H) 166(H) 116(H)  BUN 6 - 20 mg/dL 14 16 10   Creatinine 0.61 - 1.24 mg/dL 0.75 0.79 0.71  Sodium 135 - 145 mmol/L 136 137 137  Potassium 3.5 - 5.1 mmol/L 3.7 3.8 3.7  Chloride 98 - 111 mmol/L 103 103 105  CO2 22 - 32 mmol/L 27 27 25   Calcium 8.9 - 10.3 mg/dL 8.7(L) 8.4(L) 8.6(L)  Total Protein 6.5 - 8.1 g/dL 7.3 6.9 7.0  Total Bilirubin 0.3 - 1.2 mg/dL 0.2(L) 0.5 0.3  Alkaline Phos 38 - 126 U/L 92 75 78  AST 15 - 41 U/L 24 25 20   ALT 0 - 44 U/L 32 19 21    DIAGNOSTIC IMAGING:  I have independently reviewed the scans and discussed with the patient. DG Chest Port 1 View  Result Date: 06/23/2021 CLINICAL DATA:  Post Port-A-Cath placement EXAM: PORTABLE CHEST 1 VIEW COMPARISON:  01/23/2020 FINDINGS: Interval placement of left chest port with catheter tip  overlying the SVC. Normal cardiac and mediastinal contours. No focal  pulmonary opacity. No pleural effusion pneumothorax. No acute osseous abnormality. IMPRESSION: 1. Status post placement of a left chest port with catheter tip overlying the SVC. 2. No active cardiopulmonary disease. Electronically Signed   By: Merilyn Baba M.D.   On: 06/23/2021 13:19   DG C-Arm 1-60 Min-No Report  Result Date: 06/23/2021 Fluoroscopy was utilized by the requesting physician.  No radiographic interpretation.     ASSESSMENT:  1.  Gastric antral adenocarcinoma: - EGD on 05/12/2021 gastric antral mass with large cratered ulcer.  Normal duodenal bulb and first part of the duodenum. - Biopsy of the antrum ulcer consistent with adenocarcinoma, at least intramucosal.  Depth of invasion cannot be judged as the biopsy was superficial. - CT CAP on 05/14/2021 with gastric antral soft tissue thickening/mass measuring 3.7 x 3 cm.  1.2 cm portacaval lymph node.  Node within the perigastric fat anterior to the pylorus measures 5 mm.  No evidence of distant metastatic disease in the abdomen or pelvis.  Isolated 4 mm right middle lobe lung nodule most likely incidental/benign. - 13 pound weight loss in the last 3 months. - PET scan on 05/29/2021 showed mass in the gastric antrum FDG avid with SUV 5.32.  No definite evidence of FDG avid nodal metastasis or distant metastatic disease.  There is only low-level FDG uptake associated with previously described prominent upper abdominal lymph nodes. - EGD/EUS on 06/19/2021-uT3N1 4 cm cratered noncircumferential distal gastric adenocarcinoma.  There is one suspicious 9 mm perigastric lymph node nearby the primary gastric mass.  2.  Social/family history: - He is married and seen with his wife today. - He does Architect work. - He quit smoking cigarettes 13 years ago.  Quit drinking alcohol in March 2022. - No family history of malignancies.   PLAN:  1.  Stage III (uT3 N1) gastric adenocarcinoma: - He had slightly more side effects after cycle 2. - He  had nausea for 2 days but denied any vomiting.  Compazine helped. - He had diarrhea for 2 days, 3 times per day, soft. - He had cold sensitivity for 2 days.  Denies any tingling or numbness. - Reviewed labs today which showed normal LFTs and CBC. - Proceed with cycle 3 today.  RTC 2 weeks for follow-up.  2.  Weight loss: - He lost 13 pounds in the last 3 months.  However his weight has been stable in the last 4 weeks.  3.  Abdominal pain: - He used to have abdominal pain in the mornings around 2 AM which got better after eating. - He does not report any pains since cycle 2.  4.  Constipation: - He was taking stool softener daily. - He developed diarrhea for 2 days after last cycle, 3 times per day, soft stools.  He was told to back off on stool softener when he gets diarrhea.  5.  Normocytic anemia: - Due to combination anemia from a low iron and myelosuppression. - Ferritin was 18, percent saturation 8.  Folic acid and O27 was normal.  Hemoglobin 10.1. - We will give him parenteral iron therapy.   Orders placed this encounter:  No orders of the defined types were placed in this encounter.    Derek Jack, MD [place] [phone]   I, Thana Ates, am acting as a scribe for Dr. Derek Jack.  I, Derek Jack MD, have reviewed the above documentation for accuracy and completeness, and  I agree with the above.

## 2021-07-23 ENCOUNTER — Inpatient Hospital Stay: Payer: 59 | Attending: Hematology | Admitting: General Practice

## 2021-07-23 ENCOUNTER — Other Ambulatory Visit: Payer: Self-pay

## 2021-07-23 ENCOUNTER — Inpatient Hospital Stay (HOSPITAL_BASED_OUTPATIENT_CLINIC_OR_DEPARTMENT_OTHER): Payer: 59 | Admitting: Hematology

## 2021-07-23 ENCOUNTER — Inpatient Hospital Stay (HOSPITAL_COMMUNITY): Payer: 59

## 2021-07-23 VITALS — BP 134/99 | HR 75 | Temp 98.7°F | Resp 18 | Wt 158.9 lb

## 2021-07-23 VITALS — BP 127/82 | HR 80 | Temp 98.2°F | Resp 18

## 2021-07-23 DIAGNOSIS — C163 Malignant neoplasm of pyloric antrum: Secondary | ICD-10-CM

## 2021-07-23 DIAGNOSIS — Z5111 Encounter for antineoplastic chemotherapy: Secondary | ICD-10-CM | POA: Diagnosis not present

## 2021-07-23 DIAGNOSIS — Z87891 Personal history of nicotine dependence: Secondary | ICD-10-CM | POA: Diagnosis not present

## 2021-07-23 DIAGNOSIS — D649 Anemia, unspecified: Secondary | ICD-10-CM | POA: Diagnosis not present

## 2021-07-23 DIAGNOSIS — Z79899 Other long term (current) drug therapy: Secondary | ICD-10-CM | POA: Diagnosis not present

## 2021-07-23 DIAGNOSIS — C169 Malignant neoplasm of stomach, unspecified: Secondary | ICD-10-CM

## 2021-07-23 DIAGNOSIS — Z95828 Presence of other vascular implants and grafts: Secondary | ICD-10-CM

## 2021-07-23 DIAGNOSIS — F1019 Alcohol abuse with unspecified alcohol-induced disorder: Secondary | ICD-10-CM | POA: Diagnosis not present

## 2021-07-23 DIAGNOSIS — K5900 Constipation, unspecified: Secondary | ICD-10-CM | POA: Diagnosis not present

## 2021-07-23 DIAGNOSIS — R110 Nausea: Secondary | ICD-10-CM | POA: Diagnosis not present

## 2021-07-23 LAB — COMPREHENSIVE METABOLIC PANEL
ALT: 27 U/L (ref 0–44)
AST: 24 U/L (ref 15–41)
Albumin: 3.9 g/dL (ref 3.5–5.0)
Alkaline Phosphatase: 120 U/L (ref 38–126)
Anion gap: 6 (ref 5–15)
BUN: 17 mg/dL (ref 6–20)
CO2: 27 mmol/L (ref 22–32)
Calcium: 8.8 mg/dL — ABNORMAL LOW (ref 8.9–10.3)
Chloride: 105 mmol/L (ref 98–111)
Creatinine, Ser: 0.69 mg/dL (ref 0.61–1.24)
GFR, Estimated: >60 mL/min (ref >60)
Glucose, Bld: 106 mg/dL — ABNORMAL HIGH (ref 70–99)
Potassium: 3.9 mmol/L (ref 3.5–5.1)
Sodium: 138 mmol/L (ref 135–145)
Total Bilirubin: 0.4 mg/dL (ref 0.3–1.2)
Total Protein: 6.9 g/dL (ref 6.5–8.1)

## 2021-07-23 LAB — CBC WITH DIFFERENTIAL/PLATELET
Abs Immature Granulocytes: 0.85 10*3/uL — ABNORMAL HIGH (ref 0.00–0.07)
Basophils Absolute: 0.1 10*3/uL (ref 0.0–0.1)
Basophils Relative: 1 %
Eosinophils Absolute: 0.1 10*3/uL (ref 0.0–0.5)
Eosinophils Relative: 1 %
HCT: 32.3 % — ABNORMAL LOW (ref 39.0–52.0)
Hemoglobin: 10.1 g/dL — ABNORMAL LOW (ref 13.0–17.0)
Immature Granulocytes: 9 %
Lymphocytes Relative: 23 %
Lymphs Abs: 2.3 10*3/uL (ref 0.7–4.0)
MCH: 23.9 pg — ABNORMAL LOW (ref 26.0–34.0)
MCHC: 31.3 g/dL (ref 30.0–36.0)
MCV: 76.4 fL — ABNORMAL LOW (ref 80.0–100.0)
Monocytes Absolute: 0.8 10*3/uL (ref 0.1–1.0)
Monocytes Relative: 8 %
Neutro Abs: 5.7 10*3/uL (ref 1.7–7.7)
Neutrophils Relative %: 58 %
Platelets: 235 10*3/uL (ref 150–400)
RBC: 4.23 MIL/uL (ref 4.22–5.81)
RDW: 14.5 % (ref 11.5–15.5)
WBC: 9.9 10*3/uL (ref 4.0–10.5)
nRBC: 0.4 % — ABNORMAL HIGH (ref 0.0–0.2)

## 2021-07-23 LAB — FOLATE: Folate: 41.3 ng/mL (ref >5.9)

## 2021-07-23 LAB — IRON AND TIBC
Iron: 33 ug/dL — ABNORMAL LOW (ref 45–182)
Saturation Ratios: 8 % — ABNORMAL LOW (ref 17.9–39.5)
TIBC: 415 ug/dL (ref 250–450)
UIBC: 382 ug/dL

## 2021-07-23 LAB — MAGNESIUM: Magnesium: 2.4 mg/dL (ref 1.7–2.4)

## 2021-07-23 LAB — FERRITIN: Ferritin: 18 ng/mL — ABNORMAL LOW (ref 24–336)

## 2021-07-23 LAB — VITAMIN B12: Vitamin B-12: 4211 pg/mL — ABNORMAL HIGH (ref 180–914)

## 2021-07-23 MED ORDER — SODIUM CHLORIDE 0.9 % IV SOLN
50.0000 mg/m2 | Freq: Once | INTRAVENOUS | Status: AC
  Administered 2021-07-23: 90 mg via INTRAVENOUS
  Filled 2021-07-23: qty 9

## 2021-07-23 MED ORDER — DEXTROSE 5 % IV SOLN: Freq: Once | INTRAVENOUS | Status: AC

## 2021-07-23 MED ORDER — HEPARIN SOD (PORK) LOCK FLUSH 100 UNIT/ML IV SOLN: 500.0000 [IU] | Freq: Once | INTRAVENOUS | Status: DC | PRN

## 2021-07-23 MED ORDER — SODIUM CHLORIDE 0.9 % IV SOLN
5000.0000 mg | INTRAVENOUS | Status: DC
  Administered 2021-07-23: 5000 mg via INTRAVENOUS
  Filled 2021-07-23: qty 100

## 2021-07-23 MED ORDER — SODIUM CHLORIDE 0.9 % IV SOLN
10.0000 mg | Freq: Once | INTRAVENOUS | Status: AC
  Administered 2021-07-23: 10 mg via INTRAVENOUS
  Filled 2021-07-23: qty 10

## 2021-07-23 MED ORDER — LEUCOVORIN CALCIUM INJECTION 350 MG
200.0000 mg/m2 | Freq: Once | INTRAVENOUS | Status: AC
  Administered 2021-07-23: 366 mg via INTRAVENOUS
  Filled 2021-07-23: qty 18.3

## 2021-07-23 MED ORDER — SODIUM CHLORIDE 0.9 % IV SOLN: INTRAVENOUS | Status: DC

## 2021-07-23 MED ORDER — PALONOSETRON HCL INJECTION 0.25 MG/5ML
0.2500 mg | Freq: Once | INTRAVENOUS | Status: AC
  Administered 2021-07-23: 0.25 mg via INTRAVENOUS
  Filled 2021-07-23: qty 5

## 2021-07-23 MED ORDER — SODIUM CHLORIDE 0.9% FLUSH: 10.0000 mL | INTRAVENOUS | Status: DC | PRN

## 2021-07-23 MED ORDER — OXALIPLATIN CHEMO INJECTION 100 MG/20ML
83.0000 mg/m2 | Freq: Once | INTRAVENOUS | Status: AC
  Administered 2021-07-23: 150 mg via INTRAVENOUS
  Filled 2021-07-23: qty 10

## 2021-07-23 NOTE — Progress Notes (Signed)
[facility] CSW Progress Notes  Call to wife to assess for support needs. She is taking on the burden of financial support for family - has increased her work hours significantly.  Four children at home.  Patient is very fatigued for several days post treatment and cannot help w childcare or household chores, nor can he maintain his normal work schedule.  He has applied for [employer] but hopes to return to work as soon as he is physically able.    Wife has limited support - sister in law who would normally help w practical details like meals and childcare is out of country.  Wife's mother lives in [place] area and watches children while wife works on [employer].  Children are stressed by father's medical situation - increased behavioral difficulties at school for older children.  Most pressing needs per wife are help with children and financial help.  They do not have Food Stamps - referred to [employer] for mobile market and possible help w SNAP EBT application.  Will investigate options for respite care support via community grants if available.  They are getting some help from [employer] and [facility].  CSW will continue to follow family.  Edwyna Shell, LCSW Clinical Social Worker Phone:  [phone]

## 2021-07-23 NOTE — Progress Notes (Signed)
Patient has been examined, vital signs and labs have been reviewed by Dr. Katragadda. ANC, Creatinine, LFTs, hemoglobin, and platelets are within treatment parameters per Dr. Katragadda. Patient may proceed with treatment per M.D.   

## 2021-07-23 NOTE — Progress Notes (Signed)
Patients port flushed without difficulty.  Good blood return noted with no bruising or swelling noted at site.  Antimicrobial disc in place.   Patient remains accessed for chemotherapy treatment.

## 2021-07-23 NOTE — Progress Notes (Signed)
Patient presents today for FLOT per providers order.  Vital signs and labs within parameters for treatment.  Patient has no new complaints at this time.  FLOT given today per MD orders.  Stable during infusion without adverse affects.  5FU pump connected and verified RUN on the screen with the patient.  Vital signs stable.  No complaints at this time.  Discharge from clinic ambulatory in stable condition.  Alert and oriented X 3.  Follow up with [facility] as scheduled.

## 2021-07-23 NOTE — Patient Instructions (Signed)
[facility]  Discharge Instructions: Thank you for choosing [facility] to provide your oncology and hematology care.  If you have a lab appointment with the [facility], please come in thru the Main Entrance and check in at the main information desk.  Wear comfortable clothing and clothing appropriate for easy access to any Portacath or PICC line.   We strive to give you quality time with your provider. You may need to reschedule your appointment if you arrive late (15 or more minutes).  Arriving late affects you and other patients whose appointments are after yours.  Also, if you miss three or more appointments without notifying the office, you may be dismissed from the clinic at the provider's discretion.      For prescription refill requests, have your pharmacy contact our office and allow 72 hours for refills to be completed.    Today you received the following chemotherapy and/or immunotherapy agents FLOT      To help prevent nausea and vomiting after your treatment, we encourage you to take your nausea medication as directed.  BELOW ARE SYMPTOMS THAT SHOULD BE REPORTED IMMEDIATELY: *FEVER GREATER THAN 100.4 F (38 C) OR HIGHER *CHILLS OR SWEATING *NAUSEA AND VOMITING THAT IS NOT CONTROLLED WITH YOUR NAUSEA MEDICATION *UNUSUAL SHORTNESS OF BREATH *UNUSUAL BRUISING OR BLEEDING *URINARY PROBLEMS (pain or burning when urinating, or frequent urination) *BOWEL PROBLEMS (unusual diarrhea, constipation, pain near the anus) TENDERNESS IN MOUTH AND THROAT WITH OR WITHOUT PRESENCE OF ULCERS (sore throat, sores in mouth, or a toothache) UNUSUAL RASH, SWELLING OR PAIN  UNUSUAL VAGINAL DISCHARGE OR ITCHING   Items with * indicate a potential emergency and should be followed up as soon as possible or go to the Emergency Department if any problems should occur.  Please show the CHEMOTHERAPY ALERT CARD or IMMUNOTHERAPY ALERT CARD at check-in to the Emergency Department  and triage nurse.  Should you have questions after your visit or need to cancel or reschedule your appointment, please contact [facility] [phone]  and follow the prompts.  Office hours are 8:00 a.m. to 4:30 p.m. Monday - Friday. Please note that voicemails left after 4:00 p.m. may not be returned until the following business day.  We are closed weekends and major holidays. You have access to a nurse at all times for urgent questions. Please call the main number to the clinic [phone] and follow the prompts.  For any non-urgent questions, you may also contact your provider using MyChart. We now offer e-Visits for anyone 30 and older to request care online for non-urgent symptoms. For details visit [email or web address].   Also download the MyChart app! Go to the app store, search "MyChart", open the app, select [facility], and log in with your MyChart username and password.  Due to Covid, a mask is required upon entering the hospital/clinic. If you do not have a mask, one will be given to you upon arrival. For doctor visits, patients may have 1 support person aged 30 or older with them. For treatment visits, patients cannot have anyone with them due to current Covid guidelines and our immunocompromised population.

## 2021-07-23 NOTE — Patient Instructions (Signed)
[place] at [facility] Discharge Instructions  You were seen and examined today by Dr. Delton Coombes. We will proceed with cycle 3 of your treatment today. Return as scheduled for lab work and treatment.    Thank you for choosing [place] at [facility] to provide your oncology and hematology care.  To afford each patient quality time with our provider, please arrive at least 15 minutes before your scheduled appointment time.   If you have a lab appointment with the [facility] please come in thru the Main Entrance and check in at the main information desk.  You need to re-schedule your appointment should you arrive 10 or more minutes late.  We strive to give you quality time with our providers, and arriving late affects you and other patients whose appointments are after yours.  Also, if you no show three or more times for appointments you may be dismissed from the clinic at the providers discretion.     Again, thank you for choosing [facility].  Our hope is that these requests will decrease the amount of time that you wait before being seen by our physicians.       _____________________________________________________________  Should you have questions after your visit to [facility], please contact our office at [phone] and follow the prompts.  Our office hours are 8:00 a.m. and 4:30 p.m. Monday - Friday.  Please note that voicemails left after 4:00 p.m. may not be returned until the following business day.  We are closed weekends and major holidays.  You do have access to a nurse 24-7, just call the main number to the clinic [phone] and do not press any options, hold on the line and a nurse will answer the phone.    For prescription refill requests, have your pharmacy contact our office and allow 72 hours.    Due to Covid, you will need to wear a mask upon entering the hospital. If you do not have a mask,  a mask will be given to you at the Main Entrance upon arrival. For doctor visits, patients may have 1 support person age 28 or older with them. For treatment visits, patients can not have anyone with them due to social distancing guidelines and our immunocompromised population.

## 2021-07-24 ENCOUNTER — Inpatient Hospital Stay (HOSPITAL_COMMUNITY): Payer: 59

## 2021-07-24 VITALS — BP 142/93 | HR 83 | Temp 98.8°F | Resp 18

## 2021-07-24 DIAGNOSIS — C163 Malignant neoplasm of pyloric antrum: Secondary | ICD-10-CM

## 2021-07-24 DIAGNOSIS — Z5111 Encounter for antineoplastic chemotherapy: Secondary | ICD-10-CM | POA: Diagnosis not present

## 2021-07-24 DIAGNOSIS — D509 Iron deficiency anemia, unspecified: Secondary | ICD-10-CM | POA: Insufficient documentation

## 2021-07-24 DIAGNOSIS — Z95828 Presence of other vascular implants and grafts: Secondary | ICD-10-CM

## 2021-07-24 LAB — CEA: CEA: 6 ng/mL — ABNORMAL HIGH (ref 0.0–4.7)

## 2021-07-24 MED ORDER — HEPARIN SOD (PORK) LOCK FLUSH 100 UNIT/ML IV SOLN
500.0000 [IU] | Freq: Once | INTRAVENOUS | Status: AC | PRN
  Administered 2021-07-24: 500 [IU]

## 2021-07-24 MED ORDER — PEGFILGRASTIM-CBQV 6 MG/0.6ML ~~LOC~~ SOSY
6.0000 mg | PREFILLED_SYRINGE | Freq: Once | SUBCUTANEOUS | Status: AC
  Administered 2021-07-24: 6 mg via SUBCUTANEOUS
  Filled 2021-07-24: qty 0.6

## 2021-07-24 MED ORDER — SODIUM CHLORIDE 0.9% FLUSH
10.0000 mL | INTRAVENOUS | Status: DC | PRN
  Administered 2021-07-24: 10 mL

## 2021-07-24 NOTE — Patient Instructions (Signed)
[facility]  Discharge Instructions: Thank you for choosing [facility] to provide your oncology and hematology care.  If you have a lab appointment with the [facility], please come in thru the Main Entrance and check in at the main information desk.  Wear comfortable clothing and clothing appropriate for easy access to any Portacath or PICC line.   We strive to give you quality time with your provider. You may need to reschedule your appointment if you arrive late (15 or more minutes).  Arriving late affects you and other patients whose appointments are after yours.  Also, if you miss three or more appointments without notifying the office, you may be dismissed from the clinic at the provider's discretion.      For prescription refill requests, have your pharmacy contact our office and allow 72 hours for refills to be completed.    Today you received the following chemotherapy and/or immunotherapy agents Pump DC and Udenyca injection      To help prevent nausea and vomiting after your treatment, we encourage you to take your nausea medication as directed.  BELOW ARE SYMPTOMS THAT SHOULD BE REPORTED IMMEDIATELY: *FEVER GREATER THAN 100.4 F (38 C) OR HIGHER *CHILLS OR SWEATING *NAUSEA AND VOMITING THAT IS NOT CONTROLLED WITH YOUR NAUSEA MEDICATION *UNUSUAL SHORTNESS OF BREATH *UNUSUAL BRUISING OR BLEEDING *URINARY PROBLEMS (pain or burning when urinating, or frequent urination) *BOWEL PROBLEMS (unusual diarrhea, constipation, pain near the anus) TENDERNESS IN MOUTH AND THROAT WITH OR WITHOUT PRESENCE OF ULCERS (sore throat, sores in mouth, or a toothache) UNUSUAL RASH, SWELLING OR PAIN  UNUSUAL VAGINAL DISCHARGE OR ITCHING   Items with * indicate a potential emergency and should be followed up as soon as possible or go to the Emergency Department if any problems should occur.  Please show the CHEMOTHERAPY ALERT CARD or IMMUNOTHERAPY ALERT CARD at check-in to  the Emergency Department and triage nurse.  Should you have questions after your visit or need to cancel or reschedule your appointment, please contact [facility] [phone]  and follow the prompts.  Office hours are 8:00 a.m. to 4:30 p.m. Monday - Friday. Please note that voicemails left after 4:00 p.m. may not be returned until the following business day.  We are closed weekends and major holidays. You have access to a nurse at all times for urgent questions. Please call the main number to the clinic [phone] and follow the prompts.  For any non-urgent questions, you may also contact your provider using MyChart. We now offer e-Visits for anyone 31 and older to request care online for non-urgent symptoms. For details visit [email or web address].   Also download the MyChart app! Go to the app store, search "MyChart", open the app, select [facility], and log in with your MyChart username and password.  Due to Covid, a mask is required upon entering the hospital/clinic. If you do not have a mask, one will be given to you upon arrival. For doctor visits, patients may have 1 support person aged 72 or older with them. For treatment visits, patients cannot have anyone with them due to current Covid guidelines and our immunocompromised population.

## 2021-07-24 NOTE — Progress Notes (Signed)
Patient presents today for 5FU pump stop and disconnection after 46 hour continous infusion.   5FU pump deaccessed.  Patients port flushed without difficulty.  Good blood return noted with no bruising or swelling noted at site.  needle removed intact.  Band aid applied.  VSS with discharge and left in satisfactory condition via wheelchair with no s/s of distress noted.   Udenyca administration without incident; injection site WNL; see MAR for injection details.  Patient tolerated procedure well and without incident.  No questions or complaints noted at this time.  Discharge from clinic ambulatory in stable condition.  Alert and oriented X 3.  Follow up with [facility] as scheduled.

## 2021-07-24 NOTE — Progress Notes (Signed)
Orders received for patient to receive 2 doses of Feraheme weekly.  Plan added.  T.O. Dr Erasmo Leventhal, RN/[facility]isman Ronnald Ramp, PharmD

## 2021-07-25 ENCOUNTER — Other Ambulatory Visit (HOSPITAL_COMMUNITY): Payer: Self-pay

## 2021-07-25 ENCOUNTER — Encounter (HOSPITAL_COMMUNITY): Payer: 59

## 2021-07-25 NOTE — Telephone Encounter (Signed)
Patient's wife called reporting bloating and increased stomach acid. Patient reportedly stopped Protonix. Instructed to restart Protonix and use Maalox and Gas-X to relieve symptoms per Dr. Delton Coombes.

## 2021-07-31 ENCOUNTER — Other Ambulatory Visit: Payer: Self-pay

## 2021-07-31 ENCOUNTER — Inpatient Hospital Stay (HOSPITAL_COMMUNITY): Payer: 59

## 2021-07-31 VITALS — BP 128/91 | HR 76 | Temp 97.4°F | Resp 18

## 2021-07-31 DIAGNOSIS — D509 Iron deficiency anemia, unspecified: Secondary | ICD-10-CM

## 2021-07-31 DIAGNOSIS — Z5111 Encounter for antineoplastic chemotherapy: Secondary | ICD-10-CM | POA: Diagnosis not present

## 2021-07-31 MED ORDER — SODIUM CHLORIDE 0.9% FLUSH
10.0000 mL | Freq: Once | INTRAVENOUS | Status: AC | PRN
  Administered 2021-07-31: 10 mL

## 2021-07-31 MED ORDER — HEPARIN SOD (PORK) LOCK FLUSH 100 UNIT/ML IV SOLN
500.0000 [IU] | Freq: Once | INTRAVENOUS | Status: AC | PRN
  Administered 2021-07-31: 500 [IU]

## 2021-07-31 MED ORDER — SODIUM CHLORIDE 0.9 % IV SOLN
510.0000 mg | Freq: Once | INTRAVENOUS | Status: AC
  Administered 2021-07-31: 510 mg via INTRAVENOUS
  Filled 2021-07-31: qty 510

## 2021-07-31 MED ORDER — SODIUM CHLORIDE 0.9 % IV SOLN: Freq: Once | INTRAVENOUS | Status: AC

## 2021-07-31 MED ORDER — ACETAMINOPHEN 325 MG PO TABS
650.0000 mg | ORAL_TABLET | Freq: Once | ORAL | Status: AC
  Administered 2021-07-31: 650 mg via ORAL
  Filled 2021-07-31: qty 2

## 2021-07-31 MED ORDER — LORATADINE 10 MG PO TABS
10.0000 mg | ORAL_TABLET | Freq: Once | ORAL | Status: AC
  Administered 2021-07-31: 10 mg via ORAL
  Filled 2021-07-31: qty 1

## 2021-07-31 NOTE — Patient Instructions (Signed)
[facility]  Discharge Instructions: Thank you for choosing [facility] to provide your oncology and hematology care.  If you have a lab appointment with the Cancer Center, please come in thru the Main Entrance and check in at the main information desk.  Wear comfortable clothing and clothing appropriate for easy access to any Portacath or PICC line.   We strive to give you quality time with your provider. You may need to reschedule your appointment if you arrive late (15 or more minutes).  Arriving late affects you and other patients whose appointments are after yours.  Also, if you miss three or more appointments without notifying the office, you may be dismissed from the clinic at the provider's discretion.      For prescription refill requests, have your pharmacy contact our office and allow 72 hours for refills to be completed.        To help prevent nausea and vomiting after your treatment, we encourage you to take your nausea medication as directed.  BELOW ARE SYMPTOMS THAT SHOULD BE REPORTED IMMEDIATELY: *FEVER GREATER THAN 100.4 F (38 C) OR HIGHER *CHILLS OR SWEATING *NAUSEA AND VOMITING THAT IS NOT CONTROLLED WITH YOUR NAUSEA MEDICATION *UNUSUAL SHORTNESS OF BREATH *UNUSUAL BRUISING OR BLEEDING *URINARY PROBLEMS (pain or burning when urinating, or frequent urination) *BOWEL PROBLEMS (unusual diarrhea, constipation, pain near the anus) TENDERNESS IN MOUTH AND THROAT WITH OR WITHOUT PRESENCE OF ULCERS (sore throat, sores in mouth, or a toothache) UNUSUAL RASH, SWELLING OR PAIN  UNUSUAL VAGINAL DISCHARGE OR ITCHING   Items with * indicate a potential emergency and should be followed up as soon as possible or go to the Emergency Department if any problems should occur.  Please show the CHEMOTHERAPY ALERT CARD or IMMUNOTHERAPY ALERT CARD at check-in to the Emergency Department and triage nurse.  Should you have questions after your visit or need to cancel  or reschedule your appointment, please contact [facility] [phone]  and follow the prompts.  Office hours are 8:00 a.m. to 4:30 p.m. Monday - Friday. Please note that voicemails left after 4:00 p.m. may not be returned until the following business day.  We are closed weekends and major holidays. You have access to a nurse at all times for urgent questions. Please call the main number to the clinic [phone] and follow the prompts.  For any non-urgent questions, you may also contact your provider using MyChart. We now offer e-Visits for anyone 18 and older to request care online for non-urgent symptoms. For details visit [email or web address].   Also download the MyChart app! Go to the app store, search "MyChart", open the app, select [facility], and log in with your MyChart username and password.  Due to Covid, a mask is required upon entering the hospital/clinic. If you do not have a mask, one will be given to you upon arrival. For doctor visits, patients may have 1 support person aged 18 or older with them. For treatment visits, patients cannot have anyone with them due to current Covid guidelines and our immunocompromised population.  

## 2021-07-31 NOTE — Progress Notes (Signed)
Patient presents today for iron infusion.  Patient is in satisfactory condition with no complaints voiced.  Vital signs are stable.  We will proceed with treatment per MD orders.  Patient tolerated treatment well with no complaints voiced.  Patient left ambulatory in stable condition.  Vital signs stable at discharge.  Follow up as scheduled.    

## 2021-08-05 NOTE — Progress Notes (Signed)
[place], [place] [address]   CLINIC:  Medical Oncology/Hematology  PCP:  [address] / [place] [address] [phone]   REASON FOR VISIT:  Follow-up for adenocarcinoma of the stomach  PRIOR THERAPY: none  NGS Results: not done  CURRENT THERAPY: Gastroesophageal FLOT every 2 weeks for 4 cycles  BRIEF ONCOLOGIC HISTORY:  Oncology History  Gastric cancer ([place])  06/19/2021 Initial Diagnosis   Gastric cancer ([facility])   06/25/2021 -  Chemotherapy   Patient is on Treatment Plan : GASTROESOPHAGEAL FLOT q14d X 4 cycles       CANCER STAGING: Cancer Staging Gastric cancer ([place]) Staging form: Stomach, AJCC 8th Edition - Clinical stage from 06/19/2021: Stage III (cT3, cN1, cM0) - Unsigned   INTERVAL HISTORY:  [name], a 30 y.o. male, returns for routine follow-up and consideration for next cycle of chemotherapy. Jermel was last seen on 07/23/2021.  Due for cycle #4 of FLOT today.   Overall, he tells me he has been feeling pretty well, and he is accompanied by his wife who is acting as an Astronomer. He has lost 3 pounds since his last visit. He is not currently consuming protein or drinking Boost/Ensure. His wife reports black discoloration on his tongue and right upper thigh, but he denies any associated itching. He denies tingling/numbness, cold sensitivity, and n/v/d. His energy has improved following his previous Feraheme infusion. He reports regular BM, and he no longer requires stool softener.   Overall, he feels ready for next cycle of chemo today.   REVIEW OF SYSTEMS:  Review of Systems  Constitutional:  Positive for unexpected weight change (-3 lbs). Negative for appetite change and fatigue (80%).  Gastrointestinal:  Negative for constipation, diarrhea, nausea and vomiting.  Skin:  Negative for itching.  Neurological:  Negative for numbness.  All other systems reviewed and are  negative.  PAST MEDICAL/SURGICAL HISTORY:  Past Medical History:  Diagnosis Date   Acute upper GI bleed 10/13/2020   gastric ulcer with visible vessel   Alcohol abuse 10/13/2020   Quit Jan 2022   GERD (gastroesophageal reflux disease)    Port-A-Cath in place 06/23/2021   Positive H. pylori test 09/2020   H pylori IgG + Jan 2022 s/p treatment with Prevpac. EGD March 2022 with gastric biopsy negative for H. pylori.    Past Surgical History:  Procedure Laterality Date   BIOPSY  12/03/2020   Procedure: BIOPSY;  Surgeon: Eloise Harman, DO;  [facility];  Service: Endoscopy;;   BIOPSY  05/12/2021   Procedure: BIOPSY;  Surgeon: Eloise Harman, DO;  [facility];  Service: Endoscopy;;   ESOPHAGOGASTRODUODENOSCOPY (EGD) WITH PROPOFOL N/A 10/14/2020    Surgeon: Eloise Harman, DO;  one non-bleeding, non-obstructing gastric ulcer with visible vessel s/p bipolar cautery, normal examined esophagus and duodenum.    ESOPHAGOGASTRODUODENOSCOPY (EGD) WITH PROPOFOL N/A 12/03/2020    Surgeon: Eloise Harman, DO;   nonobstructing, nonbleeding gastric ulcer with clean base s/p biopsied, gastritis biopsied, normal examined duodenum.  Pathology with granulation tissue consistent with ulcer, mild chronic gastritis with focal intestinal metaplasia, H. pylori negative.  Repeat in 3 months.   ESOPHAGOGASTRODUODENOSCOPY (EGD) WITH PROPOFOL N/A 05/12/2021   Procedure: ESOPHAGOGASTRODUODENOSCOPY (EGD) WITH PROPOFOL;  Surgeon: Eloise Harman, DO;  [facility];  Service: Endoscopy;  Laterality: N/A;  10:30am   ESOPHAGOGASTRODUODENOSCOPY (EGD) WITH PROPOFOL N/A 06/19/2021   Procedure:  ESOPHAGOGASTRODUODENOSCOPY (EGD) WITH PROPOFOL;  Surgeon: [name], MD;  Location: [facility];  Service: Endoscopy;  Laterality: N/A;   EUS N/A 06/19/2021   Procedure: UPPER ENDOSCOPIC ULTRASOUND (EUS) RADIAL;  Surgeon: [name], MD;  Location: [facility];  Service: Endoscopy;   Laterality: N/A;   PORTACATH PLACEMENT Left 06/23/2021   Procedure: INSERTION PORT-A-CATH;  Surgeon: Aviva Signs, MD;  Location: [facility];  Service: General;  Laterality: Left;    SOCIAL HISTORY:  Social History   Socioeconomic History   Marital status: Married    Spouse name: Not on file   Number of children: Not on file   Years of education: Not on file   Highest education level: Not on file  Occupational History   Not on file  Tobacco Use   Smoking status: Never   Smokeless tobacco: Never  Vaping Use   Vaping Use: Never used  Substance and Sexual Activity   Alcohol use: Not Currently    Alcohol/week: 57.0 - 58.0 standard drinks    Types: 54 Cans of beer, 3 - 4 Shots of liquor per week    Comment: Quit January 2022. Used to drink sixpack daily and 12-15 beers on Saturdays and Sundays.    Drug use: Never   Sexual activity: Yes  Other Topics Concern   Not on file  Social History Narrative   Not on file   Social Determinants of Health   Financial Resource Strain: High Risk   Difficulty of Paying Living Expenses: Very hard  Food Insecurity: No Food Insecurity   Worried About Running Out of Food in the Last Year: Never true   Ran Out of Food in the Last Year: Never true  Transportation Needs: No Transportation Needs   Lack of Transportation (Medical): No   Lack of Transportation (Non-Medical): No  Physical Activity: Sufficiently Active   Days of Exercise per Week: 5 days   Minutes of Exercise per Session: 60 min  Stress: No Stress Concern Present   Feeling of Stress : Not at all  Social Connections: Moderately Isolated   Frequency of Communication with Friends and Family: More than three times a week   Frequency of Social Gatherings with Friends and Family: More than three times a week   Attends Religious Services: Never   Marine scientist or Organizations: No   Attends Music therapist: Never   Marital Status: Married  Human resources officer Violence:  Not At Risk   Fear of Current or Ex-Partner: No   Emotionally Abused: No   Physically Abused: No   Sexually Abused: No    FAMILY HISTORY:  Family History  Problem Relation Age of Onset   Diabetes Mellitus II Mother    Hypertension Mother    Colon cancer Neg Hx    Colon polyps Neg Hx     CURRENT MEDICATIONS:  Current Outpatient Medications  Medication Sig Dispense Refill   DOCETAXEL IV Inject into the vein every 14 (fourteen) days.     fluorouracil CALGB 72094 2,400 mg/m2 in sodium chloride 0.9 % 150 mL Inject 2,600 mg/m2 into the vein over 24 hr.     FLUOROURACIL IV Inject into the vein every 14 (fourteen) days.     lidocaine-prilocaine (EMLA) cream Apply a small amount to port a cath site and cover with plastic wrap 1 hour prior to chemotherapy appointments 30 g 3   Multiple Vitamin (MULTIVITAMIN WITH MINERALS) TABS tablet Take 1 tablet by mouth in the morning.  OXALIPLATIN IV Inject into the vein every 14 (fourteen) days.     pantoprazole (PROTONIX) 40 MG tablet 1 tablet     sucralfate (CARAFATE) 1 GM/10ML suspension Take 10 mLs (1 g total) by mouth 4 (four) times daily -  with meals and at bedtime. 420 mL 0   HYDROcodone-acetaminophen (NORCO) 5-325 MG tablet Take 1 tablet by mouth every 4 (four) hours as needed for moderate pain. (Patient not taking: Reported on 08/06/2021) 20 tablet 0   ondansetron (ZOFRAN ODT) 4 MG disintegrating tablet Take 1 tablet (4 mg total) by mouth every 8 (eight) hours as needed for nausea or vomiting. (Patient not taking: Reported on 08/06/2021) 60 tablet 6   prochlorperazine (COMPAZINE) 10 MG tablet Take 1 tablet (10 mg total) by mouth every 6 (six) hours as needed (Nausea or vomiting). (Patient not taking: Reported on 08/06/2021) 30 tablet 1   No current facility-administered medications for this visit.    ALLERGIES:  No Known Allergies  PHYSICAL EXAM:  Performance status (ECOG): 0 - Asymptomatic  There were no vitals filed for this  visit. Wt Readings from Last 3 Encounters:  08/06/21 154 lb 6 oz (70 kg)  07/23/21 158 lb 14.4 oz (72.1 kg)  07/09/21 159 lb 6.4 oz (72.3 kg)   Physical Exam Vitals reviewed.  Constitutional:      Appearance: Normal appearance.  HENT:     Mouth/Throat:     Comments: Mild dark discoloration on sides of tongue bilaterally Cardiovascular:     Rate and Rhythm: Normal rate and regular rhythm.     Pulses: Normal pulses.     Heart sounds: Normal heart sounds.  Pulmonary:     Effort: Pulmonary effort is normal.     Breath sounds: Normal breath sounds.  Musculoskeletal:     Right lower leg: No edema.     Left lower leg: No edema.  Neurological:     General: No focal deficit present.     Mental Status: He is alert and oriented to person, place, and time.  Psychiatric:        Mood and Affect: Mood normal.        Behavior: Behavior normal.    LABORATORY DATA:  I have reviewed the labs as listed.  CBC Latest Ref Rng & Units 08/06/2021 07/23/2021 07/09/2021  WBC 4.0 - 10.5 K/uL 6.8 9.9 6.4  Hemoglobin 13.0 - 17.0 g/dL 10.9(L) 10.1(L) 10.3(L)  Hematocrit 39.0 - 52.0 % 35.1(L) 32.3(L) 32.9(L)  Platelets 150 - 400 K/uL 207 235 299   CMP Latest Ref Rng & Units 08/06/2021 07/23/2021 07/09/2021  Glucose 70 - 99 mg/dL 163(H) 106(H) 122(H)  BUN 6 - 20 mg/dL 6 17 14   Creatinine 0.61 - 1.24 mg/dL 0.79 0.69 0.75  Sodium 135 - 145 mmol/L 137 138 136  Potassium 3.5 - 5.1 mmol/L 3.8 3.9 3.7  Chloride 98 - 111 mmol/L 105 105 103  CO2 22 - 32 mmol/L 26 27 27   Calcium 8.9 - 10.3 mg/dL 8.6(L) 8.8(L) 8.7(L)  Total Protein 6.5 - 8.1 g/dL 6.8 6.9 7.3  Total Bilirubin 0.3 - 1.2 mg/dL 0.6 0.4 0.2(L)  Alkaline Phos 38 - 126 U/L 118 120 92  AST 15 - 41 U/L 35 24 24  ALT 0 - 44 U/L 50(H) 27 32    DIAGNOSTIC IMAGING:  I have independently reviewed the scans and discussed with the patient. No results found.   ASSESSMENT:  1.  Gastric antral adenocarcinoma: - EGD on 05/12/2021 gastric antral mass  with  large cratered ulcer.  Normal duodenal bulb and first part of the duodenum. - Biopsy of the antrum ulcer consistent with adenocarcinoma, at least intramucosal.  Depth of invasion cannot be judged as the biopsy was superficial. - CT CAP on 05/14/2021 with gastric antral soft tissue thickening/mass measuring 3.7 x 3 cm.  1.2 cm portacaval lymph node.  Node within the perigastric fat anterior to the pylorus measures 5 mm.  No evidence of distant metastatic disease in the abdomen or pelvis.  Isolated 4 mm right middle lobe lung nodule most likely incidental/benign. - 13 pound weight loss in the last 3 months. - PET scan on 05/29/2021 showed mass in the gastric antrum FDG avid with SUV 5.32.  No definite evidence of FDG avid nodal metastasis or distant metastatic disease.  There is only low-level FDG uptake associated with previously described prominent upper abdominal lymph nodes. - EGD/EUS on 06/19/2021-uT3N1 4 cm cratered noncircumferential distal gastric adenocarcinoma.  There is one suspicious 9 mm perigastric lymph node nearby the primary gastric mass.  2.  Social/family history: - He is married and seen with his wife today. - He does Architect work. - He quit smoking cigarettes 13 years ago.  Quit drinking alcohol in March 2022. - No family history of malignancies.   PLAN:  1.  Stage III (uT3 N1) gastric adenocarcinoma: - Denies any tingling or numbness in the extremities.  Denies any cold sensitivity. - There is mild darker pigmentation of the tongue on the lateral margins.  No mucositis. - No GI symptoms. - Reviewed labs today which showed ALT 50.  Rest of LFTs are normal.  ALT lites are normal.  CBC shows white count and platelet count was normal.  Last CEA was 6.0. - Proceed with cycle 4 today.  RTC 2 weeks for follow-up.  2.  Weight loss: - He lost 3 pounds in the last 2 weeks.  He is reportedly eating fruits and vegetables. - He is not drinking boost.  He is apparently not eating any  meats.  He was told to increase his protein intake.  3.  Abdominal pain: - Abdominal pain has improved since cycle 2.  No pain medications.  4.  Constipation: - Constipation improved.  He is not requiring stool softener.  5.  Normocytic anemia: - He is status post Feraheme on 07/31/2021.  Noticed improvement in energy levels.  Hemoglobin today improved to 10.9.  He will receive second infusion today.   Orders placed this encounter:  No orders of the defined types were placed in this encounter.    Derek Jack, MD [facility] [phone]   I, Thana Ates, am acting as a scribe for Dr. Derek Jack.  I, Derek Jack MD, have reviewed the above documentation for accuracy and completeness, and I agree with the above.

## 2021-08-06 ENCOUNTER — Inpatient Hospital Stay (HOSPITAL_COMMUNITY): Payer: 59

## 2021-08-06 ENCOUNTER — Inpatient Hospital Stay (HOSPITAL_BASED_OUTPATIENT_CLINIC_OR_DEPARTMENT_OTHER): Payer: 59 | Admitting: Hematology

## 2021-08-06 ENCOUNTER — Other Ambulatory Visit: Payer: Self-pay

## 2021-08-06 VITALS — BP 127/85 | HR 67 | Temp 98.2°F | Resp 18

## 2021-08-06 DIAGNOSIS — C169 Malignant neoplasm of stomach, unspecified: Secondary | ICD-10-CM

## 2021-08-06 DIAGNOSIS — C163 Malignant neoplasm of pyloric antrum: Secondary | ICD-10-CM

## 2021-08-06 DIAGNOSIS — Z95828 Presence of other vascular implants and grafts: Secondary | ICD-10-CM

## 2021-08-06 DIAGNOSIS — Z5111 Encounter for antineoplastic chemotherapy: Secondary | ICD-10-CM | POA: Diagnosis not present

## 2021-08-06 DIAGNOSIS — D509 Iron deficiency anemia, unspecified: Secondary | ICD-10-CM

## 2021-08-06 LAB — COMPREHENSIVE METABOLIC PANEL
ALT: 50 U/L — ABNORMAL HIGH (ref 0–44)
AST: 35 U/L (ref 15–41)
Albumin: 3.9 g/dL (ref 3.5–5.0)
Alkaline Phosphatase: 118 U/L (ref 38–126)
Anion gap: 6 (ref 5–15)
BUN: 6 mg/dL (ref 6–20)
CO2: 26 mmol/L (ref 22–32)
Calcium: 8.6 mg/dL — ABNORMAL LOW (ref 8.9–10.3)
Chloride: 105 mmol/L (ref 98–111)
Creatinine, Ser: 0.79 mg/dL (ref 0.61–1.24)
GFR, Estimated: >60 mL/min (ref >60)
Glucose, Bld: 163 mg/dL — ABNORMAL HIGH (ref 70–99)
Potassium: 3.8 mmol/L (ref 3.5–5.1)
Sodium: 137 mmol/L (ref 135–145)
Total Bilirubin: 0.6 mg/dL (ref 0.3–1.2)
Total Protein: 6.8 g/dL (ref 6.5–8.1)

## 2021-08-06 LAB — CBC WITH DIFFERENTIAL/PLATELET
Abs Immature Granulocytes: 0.36 10*3/uL — ABNORMAL HIGH (ref 0.00–0.07)
Basophils Absolute: 0.1 10*3/uL (ref 0.0–0.1)
Basophils Relative: 1 %
Eosinophils Absolute: 0.1 10*3/uL (ref 0.0–0.5)
Eosinophils Relative: 2 %
HCT: 35.1 % — ABNORMAL LOW (ref 39.0–52.0)
Hemoglobin: 10.9 g/dL — ABNORMAL LOW (ref 13.0–17.0)
Immature Granulocytes: 5 %
Lymphocytes Relative: 25 %
Lymphs Abs: 1.7 10*3/uL (ref 0.7–4.0)
MCH: 24 pg — ABNORMAL LOW (ref 26.0–34.0)
MCHC: 31.1 g/dL (ref 30.0–36.0)
MCV: 77.1 fL — ABNORMAL LOW (ref 80.0–100.0)
Monocytes Absolute: 0.4 10*3/uL (ref 0.1–1.0)
Monocytes Relative: 6 %
Neutro Abs: 4.2 10*3/uL (ref 1.7–7.7)
Neutrophils Relative %: 61 %
Platelets: 207 10*3/uL (ref 150–400)
RBC: 4.55 MIL/uL (ref 4.22–5.81)
RDW: 18.9 % — ABNORMAL HIGH (ref 11.5–15.5)
WBC: 6.8 10*3/uL (ref 4.0–10.5)
nRBC: 0.3 % — ABNORMAL HIGH (ref 0.0–0.2)

## 2021-08-06 LAB — MAGNESIUM: Magnesium: 2.5 mg/dL — ABNORMAL HIGH (ref 1.7–2.4)

## 2021-08-06 MED ORDER — LEUCOVORIN CALCIUM INJECTION 350 MG
200.0000 mg/m2 | Freq: Once | INTRAVENOUS | Status: AC
  Administered 2021-08-06: 366 mg via INTRAVENOUS
  Filled 2021-08-06: qty 18.3

## 2021-08-06 MED ORDER — DEXTROSE 5 % IV SOLN: Freq: Once | INTRAVENOUS | Status: AC

## 2021-08-06 MED ORDER — SODIUM CHLORIDE 0.9 % IV SOLN: Freq: Once | INTRAVENOUS | Status: AC

## 2021-08-06 MED ORDER — LORATADINE 10 MG PO TABS
10.0000 mg | ORAL_TABLET | Freq: Once | ORAL | Status: AC
  Administered 2021-08-06: 10 mg via ORAL
  Filled 2021-08-06: qty 1

## 2021-08-06 MED ORDER — SODIUM CHLORIDE 0.9 % IV SOLN
510.0000 mg | Freq: Once | INTRAVENOUS | Status: AC
  Administered 2021-08-06: 510 mg via INTRAVENOUS
  Filled 2021-08-06: qty 510

## 2021-08-06 MED ORDER — SODIUM CHLORIDE 0.9 % IV SOLN
10.0000 mg | Freq: Once | INTRAVENOUS | Status: AC
  Administered 2021-08-06: 10 mg via INTRAVENOUS
  Filled 2021-08-06: qty 10

## 2021-08-06 MED ORDER — SODIUM CHLORIDE 0.9 % IV SOLN
50.0000 mg/m2 | Freq: Once | INTRAVENOUS | Status: AC
  Administered 2021-08-06: 90 mg via INTRAVENOUS
  Filled 2021-08-06: qty 9

## 2021-08-06 MED ORDER — PALONOSETRON HCL INJECTION 0.25 MG/5ML
0.2500 mg | Freq: Once | INTRAVENOUS | Status: AC
  Administered 2021-08-06: 0.25 mg via INTRAVENOUS
  Filled 2021-08-06: qty 5

## 2021-08-06 MED ORDER — ACETAMINOPHEN 325 MG PO TABS
650.0000 mg | ORAL_TABLET | Freq: Once | ORAL | Status: AC
  Administered 2021-08-06: 650 mg via ORAL
  Filled 2021-08-06: qty 2

## 2021-08-06 MED ORDER — OXALIPLATIN CHEMO INJECTION 100 MG/20ML
83.0000 mg/m2 | Freq: Once | INTRAVENOUS | Status: AC
  Administered 2021-08-06: 150 mg via INTRAVENOUS
  Filled 2021-08-06: qty 20

## 2021-08-06 MED ORDER — SODIUM CHLORIDE 0.9 % IV SOLN
5000.0000 mg | INTRAVENOUS | Status: DC
  Administered 2021-08-06: 5000 mg via INTRAVENOUS
  Filled 2021-08-06: qty 100

## 2021-08-06 NOTE — Patient Instructions (Signed)
[facility]  Discharge Instructions: Thank you for choosing [facility] to provide your oncology and hematology care.  If you have a lab appointment with the Cancer Center, please come in thru the Main Entrance and check in at the main information desk.  Wear comfortable clothing and clothing appropriate for easy access to any Portacath or PICC line.   We strive to give you quality time with your provider. You may need to reschedule your appointment if you arrive late (15 or more minutes).  Arriving late affects you and other patients whose appointments are after yours.  Also, if you miss three or more appointments without notifying the office, you may be dismissed from the clinic at the provider's discretion.      For prescription refill requests, have your pharmacy contact our office and allow 72 hours for refills to be completed.        To help prevent nausea and vomiting after your treatment, we encourage you to take your nausea medication as directed.  BELOW ARE SYMPTOMS THAT SHOULD BE REPORTED IMMEDIATELY: *FEVER GREATER THAN 100.4 F (38 C) OR HIGHER *CHILLS OR SWEATING *NAUSEA AND VOMITING THAT IS NOT CONTROLLED WITH YOUR NAUSEA MEDICATION *UNUSUAL SHORTNESS OF BREATH *UNUSUAL BRUISING OR BLEEDING *URINARY PROBLEMS (pain or burning when urinating, or frequent urination) *BOWEL PROBLEMS (unusual diarrhea, constipation, pain near the anus) TENDERNESS IN MOUTH AND THROAT WITH OR WITHOUT PRESENCE OF ULCERS (sore throat, sores in mouth, or a toothache) UNUSUAL RASH, SWELLING OR PAIN  UNUSUAL VAGINAL DISCHARGE OR ITCHING   Items with * indicate a potential emergency and should be followed up as soon as possible or go to the Emergency Department if any problems should occur.  Please show the CHEMOTHERAPY ALERT CARD or IMMUNOTHERAPY ALERT CARD at check-in to the Emergency Department and triage nurse.  Should you have questions after your visit or need to cancel  or reschedule your appointment, please contact [facility] [phone]  and follow the prompts.  Office hours are 8:00 a.m. to 4:30 p.m. Monday - Friday. Please note that voicemails left after 4:00 p.m. may not be returned until the following business day.  We are closed weekends and major holidays. You have access to a nurse at all times for urgent questions. Please call the main number to the clinic [phone] and follow the prompts.  For any non-urgent questions, you may also contact your provider using MyChart. We now offer e-Visits for anyone 18 and older to request care online for non-urgent symptoms. For details visit [email or web address].   Also download the MyChart app! Go to the app store, search "MyChart", open the app, select [facility], and log in with your MyChart username and password.  Due to Covid, a mask is required upon entering the hospital/clinic. If you do not have a mask, one will be given to you upon arrival. For doctor visits, patients may have 1 support person aged 18 or older with them. For treatment visits, patients cannot have anyone with them due to current Covid guidelines and our immunocompromised population.  

## 2021-08-06 NOTE — Progress Notes (Signed)
Patient has been examined, vital signs and labs have been reviewed by Dr. Katragadda. ANC, Creatinine, LFTs, hemoglobin, and platelets are within treatment parameters per Dr. Katragadda. Patient may proceed with treatment per M.D.   

## 2021-08-06 NOTE — Progress Notes (Signed)
Patients port flushed without difficulty.  Good blood return noted with no bruising or swelling noted at site.  Stable during access and blood draw.  Patient to remain accessed for treatment. 

## 2021-08-06 NOTE — Patient Instructions (Signed)
[place] at [facility] Discharge Instructions  You were seen and examined today by Dr. Delton Coombes. You will proceed with cycle 4 of treatment today. You will receive your second iron infusion today.  Return as scheduled in 2 weeks for labs, office visit, and treatment.    Thank you for choosing [place] at [facility] to provide your oncology and hematology care.  To afford each patient quality time with our provider, please arrive at least 15 minutes before your scheduled appointment time.   If you have a lab appointment with the [place] please come in thru the Main Entrance and check in at the main information desk.  You need to re-schedule your appointment should you arrive 10 or more minutes late.  We strive to give you quality time with our providers, and arriving late affects you and other patients whose appointments are after yours.  Also, if you no show three or more times for appointments you may be dismissed from the clinic at the providers discretion.     Again, thank you for choosing [facility].  Our hope is that these requests will decrease the amount of time that you wait before being seen by our physicians.       _____________________________________________________________  Should you have questions after your visit to [facility], please contact our office at [phone] and follow the prompts.  Our office hours are 8:00 a.m. and 4:30 p.m. Monday - Friday.  Please note that voicemails left after 4:00 p.m. may not be returned until the following business day.  We are closed weekends and major holidays.  You do have access to a nurse 24-7, just call the main number to the clinic [phone] and do not press any options, hold on the line and a nurse will answer the phone.    For prescription refill requests, have your pharmacy contact our office and allow 72 hours.    Due to Covid, you will need  to wear a mask upon entering the hospital. If you do not have a mask, a mask will be given to you at the Main Entrance upon arrival. For doctor visits, patients may have 1 support person age 85 or older with them. For treatment visits, patients can not have anyone with them due to social distancing guidelines and our immunocompromised population.

## 2021-08-06 NOTE — Progress Notes (Signed)
Patient presents today for chemotherapy and iron infusions.  Patient is in satisfactory condition with no new complaints voiced.  Vital signs are stable.  Labs reviewed by Dr. Delton Coombes during his office visit.  All labs are within treatment parameters.  We will proceed with treatment per MD orders.   Patient tolerated treatment well with no complaints voiced.  5FU home infusion pump was started.  Patient denied any questions or concerns.  Patient left ambulatory in stable condition.  Vital signs stable at discharge.  Follow up as scheduled.

## 2021-08-07 ENCOUNTER — Inpatient Hospital Stay (HOSPITAL_COMMUNITY): Payer: 59

## 2021-08-07 VITALS — BP 126/96 | HR 74 | Temp 98.6°F | Resp 18

## 2021-08-07 DIAGNOSIS — Z5111 Encounter for antineoplastic chemotherapy: Secondary | ICD-10-CM | POA: Diagnosis not present

## 2021-08-07 DIAGNOSIS — C163 Malignant neoplasm of pyloric antrum: Secondary | ICD-10-CM

## 2021-08-07 DIAGNOSIS — Z95828 Presence of other vascular implants and grafts: Secondary | ICD-10-CM

## 2021-08-07 MED ORDER — HEPARIN SOD (PORK) LOCK FLUSH 100 UNIT/ML IV SOLN
500.0000 [IU] | Freq: Once | INTRAVENOUS | Status: AC | PRN
  Administered 2021-08-07: 16:00:00 500 [IU]

## 2021-08-07 MED ORDER — SODIUM CHLORIDE 0.9% FLUSH
10.0000 mL | INTRAVENOUS | Status: DC | PRN
  Administered 2021-08-07: 16:00:00 10 mL

## 2021-08-07 MED ORDER — PEGFILGRASTIM-CBQV 6 MG/0.6ML ~~LOC~~ SOSY
6.0000 mg | PREFILLED_SYRINGE | Freq: Once | SUBCUTANEOUS | Status: AC
  Administered 2021-08-07: 16:00:00 6 mg via SUBCUTANEOUS
  Filled 2021-08-07: qty 0.6

## 2021-08-07 NOTE — Progress Notes (Signed)
Patient presents today for 5FU pump stop and disconnection after 46 hour continous infusion.   5FU pump deaccessed.  Patients port flushed without difficulty.  Good blood return noted with no bruising or swelling noted at site.  needle removed intact.  Band aid applied.  Udenyca administration without incident; injection site WNL; see MAR for injection details.  Patient tolerated procedure well and without incident.  No questions or complaints noted at this time. VSS with discharge and left in satisfactory ambulatory condition with no s/s of distress noted.

## 2021-08-07 NOTE — Patient Instructions (Signed)
[place]  Discharge Instructions: Thank you for choosing [place] to provide your oncology and hematology care.  If you have a lab appointment with the [facility], please come in thru the Main Entrance and check in at the main information desk.  Wear comfortable clothing and clothing appropriate for easy access to any Portacath or PICC line.   We strive to give you quality time with your provider. You may need to reschedule your appointment if you arrive late (15 or more minutes).  Arriving late affects you and other patients whose appointments are after yours.  Also, if you miss three or more appointments without notifying the office, you may be dismissed from the clinic at the provider's discretion.      For prescription refill requests, have your pharmacy contact our office and allow 72 hours for refills to be completed.    Today you received the following chemotherapy and/or immunotherapy agents Pump stop and disconnection and Udenyca      To help prevent nausea and vomiting after your treatment, we encourage you to take your nausea medication as directed.  BELOW ARE SYMPTOMS THAT SHOULD BE REPORTED IMMEDIATELY: *FEVER GREATER THAN 100.4 F (38 C) OR HIGHER *CHILLS OR SWEATING *NAUSEA AND VOMITING THAT IS NOT CONTROLLED WITH YOUR NAUSEA MEDICATION *UNUSUAL SHORTNESS OF BREATH *UNUSUAL BRUISING OR BLEEDING *URINARY PROBLEMS (pain or burning when urinating, or frequent urination) *BOWEL PROBLEMS (unusual diarrhea, constipation, pain near the anus) TENDERNESS IN MOUTH AND THROAT WITH OR WITHOUT PRESENCE OF ULCERS (sore throat, sores in mouth, or a toothache) UNUSUAL RASH, SWELLING OR PAIN  UNUSUAL VAGINAL DISCHARGE OR ITCHING   Items with * indicate a potential emergency and should be followed up as soon as possible or go to the Emergency Department if any problems should occur.  Please show the CHEMOTHERAPY ALERT CARD or IMMUNOTHERAPY ALERT CARD at  check-in to the Emergency Department and triage nurse.  Should you have questions after your visit or need to cancel or reschedule your appointment, please contact [facility] [phone]  and follow the prompts.  Office hours are 8:00 a.m. to 4:30 p.m. Monday - Friday. Please note that voicemails left after 4:00 p.m. may not be returned until the following business day.  We are closed weekends and major holidays. You have access to a nurse at all times for urgent questions. Please call the main number to the clinic [phone] and follow the prompts.  For any non-urgent questions, you may also contact your provider using MyChart. We now offer e-Visits for anyone 12 and older to request care online for non-urgent symptoms. For details visit [email or web address].   Also download the MyChart app! Go to the app store, search "MyChart", open the app, select [place], and log in with your MyChart username and password.  Due to Covid, a mask is required upon entering the hospital/clinic. If you do not have a mask, one will be given to you upon arrival. For doctor visits, patients may have 1 support person aged 29 or older with them. For treatment visits, patients cannot have anyone with them due to current Covid guidelines and our immunocompromised population.

## 2021-08-08 ENCOUNTER — Encounter (HOSPITAL_COMMUNITY): Payer: 59

## 2021-08-11 ENCOUNTER — Ambulatory Visit (HOSPITAL_COMMUNITY): Payer: 59

## 2021-08-20 NOTE — Progress Notes (Signed)
Steven Martinez, [place] [id number]   CLINIC:  Medical Oncology/Hematology  PCP:  [facility] [address] / [place] [address] [phone]   REASON FOR VISIT:  Follow-up for adenocarcinoma of the stomach  PRIOR THERAPY: none  NGS Results: not done  CURRENT THERAPY: Gastroesophageal FLOT every 2 weeks for 4 cycles  BRIEF ONCOLOGIC HISTORY:  Oncology History  Gastric cancer ([place])  06/19/2021 Initial Diagnosis   Gastric cancer ([place])   06/25/2021 -  Chemotherapy   Patient is on Treatment Plan : GASTROESOPHAGEAL FLOT q14d X 4 cycles       CANCER STAGING:  Cancer Staging  Gastric cancer ([place]) Staging form: Stomach, AJCC 8th Edition - Clinical stage from 06/19/2021: Stage III (cT3, cN1, cM0) - Unsigned   INTERVAL HISTORY:  Steven Martinez, a 30 y.o. male, returns for routine follow-up and consideration for next cycle of chemotherapy. Steven Martinez was last seen on 08/06/2021.  Due for cycle #5 of FLOT today.   Overall, he tells me he has been feeling pretty well. His energy and appetite has improved. He reports acid reflux following treatment which is helped by Protonix. He denies tingling/numbness, but he reports cold sensitivity for 3-4 days following treatment. He denies mouth sores, leg swellings, and skin rash. He reports abdominal pain for 1 day following treatment.   Overall, he feels ready for next cycle of chemo today.   REVIEW OF SYSTEMS:  Review of Systems  Constitutional:  Negative for appetite change and fatigue.  HENT:   Negative for mouth sores.   Cardiovascular:  Negative for leg swelling.  Gastrointestinal:  Positive for abdominal pain and nausea.  Skin:  Negative for rash.  Neurological:  Negative for numbness.  Psychiatric/Behavioral:  Positive for depression. The patient is nervous/anxious.   All other systems reviewed and are negative.  PAST MEDICAL/SURGICAL HISTORY:  Past Medical  History:  Diagnosis Date   Acute upper GI bleed 10/13/2020   gastric ulcer with visible vessel   Alcohol abuse 10/13/2020   Quit Jan 2022   GERD (gastroesophageal reflux disease)    Port-A-Cath in place 06/23/2021   Positive H. pylori test 09/2020   H pylori IgG + Jan 2022 s/p treatment with Prevpac. EGD March 2022 with gastric biopsy negative for H. pylori.    Past Surgical History:  Procedure Laterality Date   BIOPSY  12/03/2020   Procedure: BIOPSY;  Surgeon: Eloise Harman, DO;  [facility];  Service: Endoscopy;;   BIOPSY  05/12/2021   Procedure: BIOPSY;  Surgeon: Eloise Harman, DO;  [facility];  Service: Endoscopy;;   ESOPHAGOGASTRODUODENOSCOPY (EGD) WITH PROPOFOL N/A 10/14/2020    Surgeon: Eloise Harman, DO;  one non-bleeding, non-obstructing gastric ulcer with visible vessel s/p bipolar cautery, normal examined esophagus and duodenum.    ESOPHAGOGASTRODUODENOSCOPY (EGD) WITH PROPOFOL N/A 12/03/2020    Surgeon: Eloise Harman, DO;   nonobstructing, nonbleeding gastric ulcer with clean base s/p biopsied, gastritis biopsied, normal examined duodenum.  Pathology with granulation tissue consistent with ulcer, mild chronic gastritis with focal intestinal metaplasia, H. pylori negative.  Repeat in 3 months.   ESOPHAGOGASTRODUODENOSCOPY (EGD) WITH PROPOFOL N/A 05/12/2021   Procedure: ESOPHAGOGASTRODUODENOSCOPY (EGD) WITH PROPOFOL;  Surgeon: Eloise Harman, DO;  [facility];  Service: Endoscopy;  Laterality: N/A;  10:30am   ESOPHAGOGASTRODUODENOSCOPY (EGD) WITH PROPOFOL N/A 06/19/2021   Procedure: ESOPHAGOGASTRODUODENOSCOPY (EGD) WITH PROPOFOL;  Surgeon: Owens Loffler  P, MD;  Location: [facility];  Service: Endoscopy;  Laterality: N/A;   EUS N/A 06/19/2021   Procedure: UPPER ENDOSCOPIC ULTRASOUND (EUS) RADIAL;  Surgeon: [name], MD;  Location: [facility];  Service: Endoscopy;  Laterality: N/A;   PORTACATH PLACEMENT Left 06/23/2021    Procedure: INSERTION PORT-A-CATH;  Surgeon: Aviva Signs, MD;  Location: [facility];  Service: General;  Laterality: Left;    SOCIAL HISTORY:  Social History   Socioeconomic History   Marital status: Married    Spouse name: Not on file   Number of children: Not on file   Years of education: Not on file   Highest education level: Not on file  Occupational History   Not on file  Tobacco Use   Smoking status: Never   Smokeless tobacco: Never  Vaping Use   Vaping Use: Never used  Substance and Sexual Activity   Alcohol use: Not Currently    Alcohol/week: 57.0 - 58.0 standard drinks    Types: 54 Cans of beer, 3 - 4 Shots of liquor per week    Comment: Quit January 2022. Used to drink sixpack daily and 12-15 beers on Saturdays and Sundays.    Drug use: Never   Sexual activity: Yes  Other Topics Concern   Not on file  Social History Narrative   Not on file   Social Determinants of Health   Financial Resource Strain: High Risk   Difficulty of Paying Living Expenses: Very hard  Food Insecurity: No Food Insecurity   Worried About Running Out of Food in the Last Year: Never true   Ran Out of Food in the Last Year: Never true  Transportation Needs: No Transportation Needs   Lack of Transportation (Medical): No   Lack of Transportation (Non-Medical): No  Physical Activity: Sufficiently Active   Days of Exercise per Week: 5 days   Minutes of Exercise per Session: 60 min  Stress: No Stress Concern Present   Feeling of Stress : Not at all  Social Connections: Moderately Isolated   Frequency of Communication with Friends and Family: More than three times a week   Frequency of Social Gatherings with Friends and Family: More than three times a week   Attends Religious Services: Never   Marine scientist or Organizations: No   Attends Music therapist: Never   Marital Status: Married  Human resources officer Violence: Not At Risk   Fear of Current or Ex-Partner: No    Emotionally Abused: No   Physically Abused: No   Sexually Abused: No    FAMILY HISTORY:  Family History  Problem Relation Age of Onset   Diabetes Mellitus II Mother    Hypertension Mother    Colon cancer Neg Hx    Colon polyps Neg Hx     CURRENT MEDICATIONS:  Current Outpatient Medications  Medication Sig Dispense Refill   DOCETAXEL IV Inject into the vein every 14 (fourteen) days.     fluorouracil CALGB 41287 2,400 mg/m2 in sodium chloride 0.9 % 150 mL Inject 2,600 mg/m2 into the vein over 24 hr.     FLUOROURACIL IV Inject into the vein every 14 (fourteen) days.     HYDROcodone-acetaminophen (NORCO) 5-325 MG tablet Take 1 tablet by mouth every 4 (four) hours as needed for moderate pain. 20 tablet 0   lidocaine-prilocaine (EMLA) cream Apply a small amount to port a cath site and cover with plastic wrap 1 hour prior to chemotherapy appointments 30 g 3   Multiple  Vitamin (MULTIVITAMIN WITH MINERALS) TABS tablet Take 1 tablet by mouth in the morning.     ondansetron (ZOFRAN ODT) 4 MG disintegrating tablet Take 1 tablet (4 mg total) by mouth every 8 (eight) hours as needed for nausea or vomiting. 60 tablet 6   OXALIPLATIN IV Inject into the vein every 14 (fourteen) days.     pantoprazole (PROTONIX) 40 MG tablet 1 tablet     prochlorperazine (COMPAZINE) 10 MG tablet Take 1 tablet (10 mg total) by mouth every 6 (six) hours as needed (Nausea or vomiting). 30 tablet 1   sucralfate (CARAFATE) 1 GM/10ML suspension Take 10 mLs (1 g total) by mouth 4 (four) times daily -  with meals and at bedtime. 420 mL 0   No current facility-administered medications for this visit.    ALLERGIES:  No Known Allergies  PHYSICAL EXAM:  Performance status (ECOG): 0 - Asymptomatic  There were no vitals filed for this visit. Wt Readings from Last 3 Encounters:  08/06/21 154 lb 6 oz (70 kg)  07/23/21 158 lb 14.4 oz (72.1 kg)  07/09/21 159 lb 6.4 oz (72.3 kg)   Physical Exam Vitals reviewed.   Constitutional:      Appearance: Normal appearance.  Cardiovascular:     Rate and Rhythm: Normal rate and regular rhythm.     Pulses: Normal pulses.     Heart sounds: Normal heart sounds.  Pulmonary:     Effort: Pulmonary effort is normal.     Breath sounds: Normal breath sounds.  Neurological:     General: No focal deficit present.     Mental Status: He is alert and oriented to person, place, and time.  Psychiatric:        Mood and Affect: Mood normal.        Behavior: Behavior normal.    LABORATORY DATA:  I have reviewed the labs as listed.  CBC Latest Ref Rng & Units 08/21/2021 08/06/2021 07/23/2021  WBC 4.0 - 10.5 K/uL 5.8 6.8 9.9  Hemoglobin 13.0 - 17.0 g/dL 11.6(L) 10.9(L) 10.1(L)  Hematocrit 39.0 - 52.0 % 36.9(L) 35.1(L) 32.3(L)  Platelets 150 - 400 K/uL 175 207 235   CMP Latest Ref Rng & Units 08/21/2021 08/06/2021 07/23/2021  Glucose 70 - 99 mg/dL 142(H) 163(H) 106(H)  BUN 6 - 20 mg/dL 10 6 17   Creatinine 0.61 - 1.24 mg/dL 0.81 0.79 0.69  Sodium 135 - 145 mmol/L 139 137 138  Potassium 3.5 - 5.1 mmol/L 3.9 3.8 3.9  Chloride 98 - 111 mmol/L 106 105 105  CO2 22 - 32 mmol/L 28 26 27   Calcium 8.9 - 10.3 mg/dL 8.8(L) 8.6(L) 8.8(L)  Total Protein 6.5 - 8.1 g/dL 6.5 6.8 6.9  Total Bilirubin 0.3 - 1.2 mg/dL 0.2(L) 0.6 0.4  Alkaline Phos 38 - 126 U/L 99 118 120  AST 15 - 41 U/L 34 35 24  ALT 0 - 44 U/L 46(H) 50(H) 27    DIAGNOSTIC IMAGING:  I have independently reviewed the scans and discussed with the patient. No results found.   ASSESSMENT:  1.  Gastric antral adenocarcinoma: - EGD on 05/12/2021 gastric antral mass with large cratered ulcer.  Normal duodenal bulb and first part of the duodenum. - Biopsy of the antrum ulcer consistent with adenocarcinoma, at least intramucosal.  Depth of invasion cannot be judged as the biopsy was superficial. - CT CAP on 05/14/2021 with gastric antral soft tissue thickening/mass measuring 3.7 x 3 cm.  1.2 cm portacaval lymph node.  Node  within the perigastric fat anterior to the pylorus measures 5 mm.  No evidence of distant metastatic disease in the abdomen or pelvis.  Isolated 4 mm right middle lobe lung nodule most likely incidental/benign. - 13 pound weight loss in the last 3 months. - PET scan on 05/29/2021 showed mass in the gastric antrum FDG avid with SUV 5.32.  No definite evidence of FDG avid nodal metastasis or distant metastatic disease.  There is only low-level FDG uptake associated with previously described prominent upper abdominal lymph nodes. - EGD/EUS on 06/19/2021-uT3N1 4 cm cratered noncircumferential distal gastric adenocarcinoma.  There is one suspicious 9 mm perigastric lymph node nearby the primary gastric mass.  2.  Social/family history: - He is married and seen with his wife today. - He does Architect work. - He quit smoking cigarettes 13 years ago.  Quit drinking alcohol in March 2022. - No family history of malignancies.   PLAN:  1.  Stage III (uT3 N1) gastric adenocarcinoma: - He has cold sensitivity lasting up to 4 days. - He denies any tingling or numbness in the extremities. - Reviewed labs today which shows normal LFTs except elevated ALT of 46.  Bilirubin normal.  CBC was grossly normal. - He reports some acid reflux after treatments.  Recommend taking Protonix daily.  Recommend Maalox as needed.  Proceed with his next cycle today.  RTC 2 weeks for follow-up with repeat labs.  2.  Weight loss: - His weight is stable.  His appetite has improved.  3.  Abdominal pain: - He is not requiring any pain medication since chemotherapy started.  4.  Constipation: - He is not requiring any stool softener.  5.  Normocytic anemia: - Feraheme on 07/31/2021 and 08/06/2021.  He reports improvement in energy levels.  Hemoglobin improved 11 6.   Orders placed this encounter:  No orders of the defined types were placed in this encounter.    Derek Jack, MD [place] [phone]   I, Thana Ates, am acting as a scribe for Dr. Derek Jack.  I, Derek Jack MD, have reviewed the above documentation for accuracy and completeness, and I agree with the above.

## 2021-08-21 ENCOUNTER — Other Ambulatory Visit (HOSPITAL_COMMUNITY): Payer: Self-pay | Admitting: *Deleted

## 2021-08-21 ENCOUNTER — Other Ambulatory Visit: Payer: Self-pay

## 2021-08-21 ENCOUNTER — Inpatient Hospital Stay (HOSPITAL_COMMUNITY): Payer: 59 | Attending: Hematology

## 2021-08-21 ENCOUNTER — Inpatient Hospital Stay (HOSPITAL_COMMUNITY): Payer: 59 | Admitting: Dietician

## 2021-08-21 ENCOUNTER — Inpatient Hospital Stay (HOSPITAL_BASED_OUTPATIENT_CLINIC_OR_DEPARTMENT_OTHER): Payer: 59 | Admitting: Hematology

## 2021-08-21 ENCOUNTER — Inpatient Hospital Stay (HOSPITAL_COMMUNITY): Payer: 59

## 2021-08-21 ENCOUNTER — Encounter (HOSPITAL_COMMUNITY): Payer: Self-pay | Admitting: Hematology

## 2021-08-21 VITALS — Wt 158.0 lb

## 2021-08-21 DIAGNOSIS — Z5189 Encounter for other specified aftercare: Secondary | ICD-10-CM | POA: Diagnosis not present

## 2021-08-21 DIAGNOSIS — Z95828 Presence of other vascular implants and grafts: Secondary | ICD-10-CM

## 2021-08-21 DIAGNOSIS — D649 Anemia, unspecified: Secondary | ICD-10-CM | POA: Diagnosis not present

## 2021-08-21 DIAGNOSIS — K5900 Constipation, unspecified: Secondary | ICD-10-CM | POA: Diagnosis not present

## 2021-08-21 DIAGNOSIS — C169 Malignant neoplasm of stomach, unspecified: Secondary | ICD-10-CM | POA: Insufficient documentation

## 2021-08-21 DIAGNOSIS — Z79899 Other long term (current) drug therapy: Secondary | ICD-10-CM | POA: Diagnosis not present

## 2021-08-21 DIAGNOSIS — Z5111 Encounter for antineoplastic chemotherapy: Secondary | ICD-10-CM | POA: Diagnosis not present

## 2021-08-21 DIAGNOSIS — Z87891 Personal history of nicotine dependence: Secondary | ICD-10-CM | POA: Insufficient documentation

## 2021-08-21 DIAGNOSIS — C163 Malignant neoplasm of pyloric antrum: Secondary | ICD-10-CM

## 2021-08-21 LAB — CBC WITH DIFFERENTIAL/PLATELET
Abs Immature Granulocytes: 0 10*3/uL (ref 0.00–0.07)
Band Neutrophils: 4 %
Basophils Absolute: 0.1 10*3/uL (ref 0.0–0.1)
Basophils Relative: 1 %
Blasts: 0 %
Eosinophils Absolute: 0.1 10*3/uL (ref 0.0–0.5)
Eosinophils Relative: 2 %
HCT: 36.9 % — ABNORMAL LOW (ref 39.0–52.0)
Hemoglobin: 11.6 g/dL — ABNORMAL LOW (ref 13.0–17.0)
Lymphocytes Relative: 31 %
Lymphs Abs: 1.8 10*3/uL (ref 0.7–4.0)
MCH: 25.4 pg — ABNORMAL LOW (ref 26.0–34.0)
MCHC: 31.4 g/dL (ref 30.0–36.0)
MCV: 80.9 fL (ref 80.0–100.0)
Metamyelocytes Relative: 0 %
Monocytes Absolute: 0.5 10*3/uL (ref 0.1–1.0)
Monocytes Relative: 8 %
Myelocytes: 0 %
Neutro Abs: 3.3 10*3/uL (ref 1.7–7.7)
Neutrophils Relative %: 54 %
Other: 0 %
Platelets: 175 10*3/uL (ref 150–400)
Promyelocytes Relative: 0 %
RBC: 4.56 MIL/uL (ref 4.22–5.81)
RDW: 23.2 % — ABNORMAL HIGH (ref 11.5–15.5)
WBC: 5.8 10*3/uL (ref 4.0–10.5)
nRBC: 0 % (ref 0.0–0.2)
nRBC: 0 /100 WBC

## 2021-08-21 LAB — MAGNESIUM: Magnesium: 2.4 mg/dL (ref 1.7–2.4)

## 2021-08-21 LAB — COMPREHENSIVE METABOLIC PANEL
ALT: 46 U/L — ABNORMAL HIGH (ref 0–44)
AST: 34 U/L (ref 15–41)
Albumin: 3.9 g/dL (ref 3.5–5.0)
Alkaline Phosphatase: 99 U/L (ref 38–126)
Anion gap: 5 (ref 5–15)
BUN: 10 mg/dL (ref 6–20)
CO2: 28 mmol/L (ref 22–32)
Calcium: 8.8 mg/dL — ABNORMAL LOW (ref 8.9–10.3)
Chloride: 106 mmol/L (ref 98–111)
Creatinine, Ser: 0.81 mg/dL (ref 0.61–1.24)
GFR, Estimated: >60 mL/min (ref >60)
Glucose, Bld: 142 mg/dL — ABNORMAL HIGH (ref 70–99)
Potassium: 3.9 mmol/L (ref 3.5–5.1)
Sodium: 139 mmol/L (ref 135–145)
Total Bilirubin: 0.2 mg/dL — ABNORMAL LOW (ref 0.3–1.2)
Total Protein: 6.5 g/dL (ref 6.5–8.1)

## 2021-08-21 MED ORDER — LEUCOVORIN CALCIUM INJECTION 350 MG
200.0000 mg/m2 | Freq: Once | INTRAVENOUS | Status: AC
  Administered 2021-08-21: 366 mg via INTRAVENOUS
  Filled 2021-08-21: qty 18.3

## 2021-08-21 MED ORDER — DEXTROSE 5 % IV SOLN: Freq: Once | INTRAVENOUS | Status: AC

## 2021-08-21 MED ORDER — SODIUM CHLORIDE 0.9% FLUSH: 10.0000 mL | INTRAVENOUS | Status: DC | PRN

## 2021-08-21 MED ORDER — SODIUM CHLORIDE 0.9 % IV SOLN
5000.0000 mg | INTRAVENOUS | Status: DC
  Administered 2021-08-21: 5000 mg via INTRAVENOUS
  Filled 2021-08-21: qty 100

## 2021-08-21 MED ORDER — PANTOPRAZOLE SODIUM 40 MG PO TBEC
40.0000 mg | DELAYED_RELEASE_TABLET | Freq: Every day | ORAL | 6 refills | Status: DC
Start: 2021-08-21 — End: 2021-11-07

## 2021-08-21 MED ORDER — SODIUM CHLORIDE 0.9 % IV SOLN
10.0000 mg | Freq: Once | INTRAVENOUS | Status: AC
  Administered 2021-08-21: 10 mg via INTRAVENOUS
  Filled 2021-08-21: qty 10

## 2021-08-21 MED ORDER — OXALIPLATIN CHEMO INJECTION 100 MG/20ML
83.0000 mg/m2 | Freq: Once | INTRAVENOUS | Status: AC
  Administered 2021-08-21: 150 mg via INTRAVENOUS
  Filled 2021-08-21: qty 10

## 2021-08-21 MED ORDER — PANTOPRAZOLE SODIUM 40 MG PO TBEC: DELAYED_RELEASE_TABLET | ORAL | 6 refills | Status: DC

## 2021-08-21 MED ORDER — HEPARIN SOD (PORK) LOCK FLUSH 100 UNIT/ML IV SOLN: 500.0000 [IU] | Freq: Once | INTRAVENOUS | Status: DC | PRN

## 2021-08-21 MED ORDER — SODIUM CHLORIDE 0.9 % IV SOLN
50.0000 mg/m2 | Freq: Once | INTRAVENOUS | Status: AC
  Administered 2021-08-21: 90 mg via INTRAVENOUS
  Filled 2021-08-21: qty 9

## 2021-08-21 MED ORDER — PALONOSETRON HCL INJECTION 0.25 MG/5ML
0.2500 mg | Freq: Once | INTRAVENOUS | Status: AC
  Administered 2021-08-21: 0.25 mg via INTRAVENOUS
  Filled 2021-08-21: qty 5

## 2021-08-21 NOTE — Patient Instructions (Signed)
[place]  Discharge Instructions: Thank you for choosing [place] to provide your oncology and hematology care.  If you have a lab appointment with the [facility], please come in thru the Main Entrance and check in at the main information desk.  Wear comfortable clothing and clothing appropriate for easy access to any Portacath or PICC line.   We strive to give you quality time with your provider. You may need to reschedule your appointment if you arrive late (15 or more minutes).  Arriving late affects you and other patients whose appointments are after yours.  Also, if you miss three or more appointments without notifying the office, you may be dismissed from the clinic at the provider's discretion.      For prescription refill requests, have your pharmacy contact our office and allow 72 hours for refills to be completed.    Today you received the following chemotherapy and/or immunotherapy FLOT.       To help prevent nausea and vomiting after your treatment, we encourage you to take your nausea medication as directed.  BELOW ARE SYMPTOMS THAT SHOULD BE REPORTED IMMEDIATELY: *FEVER GREATER THAN 100.4 F (38 C) OR HIGHER *CHILLS OR SWEATING *NAUSEA AND VOMITING THAT IS NOT CONTROLLED WITH YOUR NAUSEA MEDICATION *UNUSUAL SHORTNESS OF BREATH *UNUSUAL BRUISING OR BLEEDING *URINARY PROBLEMS (pain or burning when urinating, or frequent urination) *BOWEL PROBLEMS (unusual diarrhea, constipation, pain near the anus) TENDERNESS IN MOUTH AND THROAT WITH OR WITHOUT PRESENCE OF ULCERS (sore throat, sores in mouth, or a toothache) UNUSUAL RASH, SWELLING OR PAIN  UNUSUAL VAGINAL DISCHARGE OR ITCHING   Items with * indicate a potential emergency and should be followed up as soon as possible or go to the Emergency Department if any problems should occur.  Please show the CHEMOTHERAPY ALERT CARD or IMMUNOTHERAPY ALERT CARD at check-in to the Emergency Department and  triage nurse.  Should you have questions after your visit or need to cancel or reschedule your appointment, please contact [facility] [phone]  and follow the prompts.  Office hours are 8:00 a.m. to 4:30 p.m. Monday - Friday. Please note that voicemails left after 4:00 p.m. may not be returned until the following business day.  We are closed weekends and major holidays. You have access to a nurse at all times for urgent questions. Please call the main number to the clinic [phone] and follow the prompts.  For any non-urgent questions, you may also contact your provider using MyChart. We now offer e-Visits for anyone 46 and older to request care online for non-urgent symptoms. For details visit [email or web address].   Also download the MyChart app! Go to the app store, search "MyChart", open the app, select [place], and log in with your MyChart username and password.  Due to Covid, a mask is required upon entering the hospital/clinic. If you do not have a mask, one will be given to you upon arrival. For doctor visits, patients may have 1 support person aged 16 or older with them. For treatment visits, patients cannot have anyone with them due to current Covid guidelines and our immunocompromised population.

## 2021-08-21 NOTE — Progress Notes (Signed)
Patient presents today for treatment and follow up visit with Dr. Delton Coombes. Labs pending. Vital signs within parameters for treatment. MAR reviewed. Patient's wife at the bedside as interpreter. Patient denies pain today. Patient has no complaints of any side effects from his treatment at this time.   Treatment given today per MD orders. Tolerated infusion without adverse affects. Vital signs stable. No complaints at this time. 5FU pump infusing and RUN noted on screen and verified with patient. Discharged from clinic ambulatory in stable condition. Alert and oriented x 3. F/U with [facility] as scheduled.

## 2021-08-21 NOTE — Progress Notes (Signed)
Patients port flushed without difficulty.  Good blood return noted with no bruising or swelling noted at site.  Patient remains accessed for chemotherapy treatment. Patient is in stable condition.  °

## 2021-08-21 NOTE — Progress Notes (Signed)
Nutrition Follow-up:  Patient receiving neoadjuvant chemotherapy with FLOT for stage III gastric cancer.   Met with patient during infusion. Wife present in room. Patient reports mild cold sensitivity and nausea lasting ~4 days after chemotherapy. He denies vomiting, diarrhea, constipation, abdominal pain. Wife reports patient complains of diminished taste of foods, but continues to eat well at home, recalls 3 meals day. He is eating lots of vegetables, fish, chicken, eggs. Patient would like to know if there are any cheeses he should not be eating. Patient has not been drinking Ensure lately, reports this is too sweet. He reports new lactose intolerance with milk.    Medications: Carafate, Protonix, Norco, Zofran, Compazine   Labs: Glucose 142  Anthropometrics: Weight 158 lb today increased from 154 lb 6 oz on 11/16 decreased from 158 lb 14.4 oz on 11/2  10/19 - 159 lb 6.4 oz 10/5 - 160 lb 9.6 oz 9/12 - 157 lb 12.8 oz   NUTRITION DIAGNOSIS: Unintended weight loss improved    INTERVENTION:  Reinforced importance of adequate calories and protein energy intake to maintain weight, strength, nutrition Educated on strategies for increasing calories and protein with small frequent meals and snacks - handout with ideas provided Discussed tips for nausea, foods best tolerated and foods to limit/avoid - handout provided Discussed tips for altered taste - handout provided  Discussed food safety - handout provided Suggested patient try cutting Ensure supplement with lactose free milk  Recommend patient drink 2 Ensure Plus/equivalent day for added calories and protein  One complimentary case of Ensure Plus provided today Patient has contact information     MONITORING, EVALUATION, GOAL: weight trends, intake   NEXT VISIT: f/u to be scheduled with treatment

## 2021-08-21 NOTE — Patient Instructions (Addendum)
[place] at [facility] Discharge Instructions   You were seen and examined today by Dr. Delton Coombes. We will proceed with your treatment today.  Be sure to take Protonix daily.  Your hemoglobin has improved after iron infusions. Return as scheduled for lab work and treatment.     Thank you for choosing [place] at [facility] to provide your oncology and hematology care.  To afford each patient quality time with our provider, please arrive at least 15 minutes before your scheduled appointment time.   If you have a lab appointment with the [facility] please come in thru the Main Entrance and check in at the main information desk.  You need to re-schedule your appointment should you arrive 10 or more minutes late.  We strive to give you quality time with our providers, and arriving late affects you and other patients whose appointments are after yours.  Also, if you no show three or more times for appointments you may be dismissed from the clinic at the providers discretion.     Again, thank you for choosing [facility].  Our hope is that these requests will decrease the amount of time that you wait before being seen by our physicians.       _____________________________________________________________  Should you have questions after your visit to [facility], please contact our office at [phone] and follow the prompts.  Our office hours are 8:00 a.m. and 4:30 p.m. Monday - Friday.  Please note that voicemails left after 4:00 p.m. may not be returned until the following business day.  We are closed weekends and major holidays.  You do have access to a nurse 24-7, just call the main number to the clinic [phone] and do not press any options, hold on the line and a nurse will answer the phone.    For prescription refill requests, have your pharmacy contact our office and allow 72 hours.    Due to Covid, you  will need to wear a mask upon entering the hospital. If you do not have a mask, a mask will be given to you at the Main Entrance upon arrival. For doctor visits, patients may have 1 support person age 93 or older with them. For treatment visits, patients can not have anyone with them due to social distancing guidelines and our immunocompromised population.

## 2021-08-22 ENCOUNTER — Inpatient Hospital Stay (HOSPITAL_COMMUNITY): Payer: 59

## 2021-08-22 DIAGNOSIS — C163 Malignant neoplasm of pyloric antrum: Secondary | ICD-10-CM

## 2021-08-22 DIAGNOSIS — Z5111 Encounter for antineoplastic chemotherapy: Secondary | ICD-10-CM | POA: Diagnosis not present

## 2021-08-22 DIAGNOSIS — Z95828 Presence of other vascular implants and grafts: Secondary | ICD-10-CM

## 2021-08-22 LAB — CEA: CEA: 2.3 ng/mL (ref 0.0–4.7)

## 2021-08-22 MED ORDER — HEPARIN SOD (PORK) LOCK FLUSH 100 UNIT/ML IV SOLN
500.0000 [IU] | Freq: Once | INTRAVENOUS | Status: AC | PRN
  Administered 2021-08-22: 500 [IU]

## 2021-08-22 MED ORDER — SODIUM CHLORIDE 0.9% FLUSH
10.0000 mL | INTRAVENOUS | Status: DC | PRN
  Administered 2021-08-22: 10 mL

## 2021-08-22 MED ORDER — PEGFILGRASTIM-CBQV 6 MG/0.6ML ~~LOC~~ SOSY
6.0000 mg | PREFILLED_SYRINGE | Freq: Once | SUBCUTANEOUS | Status: AC
  Administered 2021-08-22: 6 mg via SUBCUTANEOUS

## 2021-08-22 NOTE — Patient Instructions (Signed)
[facility]  Discharge Instructions: Thank you for choosing [facility] to provide your oncology and hematology care.  If you have a lab appointment with the Cancer Center, please come in thru the Main Entrance and check in at the main information desk.  Wear comfortable clothing and clothing appropriate for easy access to any Portacath or PICC line.   We strive to give you quality time with your provider. You may need to reschedule your appointment if you arrive late (15 or more minutes).  Arriving late affects you and other patients whose appointments are after yours.  Also, if you miss three or more appointments without notifying the office, you may be dismissed from the clinic at the provider's discretion.      For prescription refill requests, have your pharmacy contact our office and allow 72 hours for refills to be completed.    Today you received the following chemotherapy and/or immunotherapy agents    To help prevent nausea and vomiting after your treatment, we encourage you to take your nausea medication as directed.  BELOW ARE SYMPTOMS THAT SHOULD BE REPORTED IMMEDIATELY: . *FEVER GREATER THAN 100.4 F (38 C) OR HIGHER . *CHILLS OR SWEATING . *NAUSEA AND VOMITING THAT IS NOT CONTROLLED WITH YOUR NAUSEA MEDICATION . *UNUSUAL SHORTNESS OF BREATH . *UNUSUAL BRUISING OR BLEEDING . *URINARY PROBLEMS (pain or burning when urinating, or frequent urination) . *BOWEL PROBLEMS (unusual diarrhea, constipation, pain near the anus) . TENDERNESS IN MOUTH AND THROAT WITH OR WITHOUT PRESENCE OF ULCERS (sore throat, sores in mouth, or a toothache) . UNUSUAL RASH, SWELLING OR PAIN  . UNUSUAL VAGINAL DISCHARGE OR ITCHING   Items with * indicate a potential emergency and should be followed up as soon as possible or go to the Emergency Department if any problems should occur.  Please show the CHEMOTHERAPY ALERT CARD or IMMUNOTHERAPY ALERT CARD at check-in to the  Emergency Department and triage nurse.  Should you have questions after your visit or need to cancel or reschedule your appointment, please contact  CANCER CENTER [phone]  and follow the prompts.  Office hours are 8:00 a.m. to 4:30 p.m. Monday - Friday. Please note that voicemails left after 4:00 p.m. may not be returned until the following business day.  We are closed weekends and major holidays. You have access to a nurse at all times for urgent questions. Please call the main number to the clinic [phone] and follow the prompts.  For any non-urgent questions, you may also contact your provider using MyChart. We now offer e-Visits for anyone 18 and older to request care online for non-urgent symptoms. For details visit [email or web address].   Also download the MyChart app! Go to the app store, search "MyChart", open the app, select [facility], and log in with your MyChart username and password.  Due to Covid, a mask is required upon entering the hospital/clinic. If you do not have a mask, one will be given to you upon arrival. For doctor visits, patients may have 1 support person aged 18 or older with them. For treatment visits, patients cannot have anyone with them due to current Covid guidelines and our immunocompromised population.  

## 2021-09-03 NOTE — Progress Notes (Signed)
[place], [place] [address]   CLINIC:  Medical Oncology/Hematology  PCP:  [address] [place] [address] [phone]   REASON FOR VISIT:  Follow-up for adenocarcinoma of the stomach  PRIOR THERAPY: none  NGS Results: not done  CURRENT THERAPY: Gastroesophageal FLOT every 2 weeks for 4 cycles  BRIEF ONCOLOGIC HISTORY:  Oncology History  Gastric cancer ([place])  06/19/2021 Initial Diagnosis   Gastric cancer ([place])   06/25/2021 -  Chemotherapy   Patient is on Treatment Plan : GASTROESOPHAGEAL FLOT q14d X 4 cycles       CANCER STAGING:  Cancer Staging  Gastric cancer ([place]) Staging form: Stomach, AJCC 8th Edition - Clinical stage from 06/19/2021: Stage III (cT3, cN1, cM0) - Unsigned   INTERVAL HISTORY:  Mr. Olumide Dolinger, a 30 y.o. male, returns for routine follow-up and consideration for next cycle of chemotherapy. Shahram was last seen on 08/21/2021.  Due for cycle #6 of FLOT today.   Overall, he tells me he has been feeling pretty well, and he is accompanied by his wife who is acting as interpreter. He reports worsened acid reflux, and his nausea has lasted for 2 weeks following last treatment. He denies constipation, tingling/numbness, and cold sensitivity. He has lost 3 pounds since last visit. He reports constant abdominal pain that feels "like someone hit him in the stomach" while receiving chemotherapy and lasts for 3-4 days after treatment and is worsened with movement; this pain was more prominent after last treatment. He denies any current pain.   Overall, he feels ready for next cycle of chemo today.    REVIEW OF SYSTEMS:  Review of Systems  Constitutional:  Positive for unexpected weight change (-3 lbs). Negative for appetite change (50%) and fatigue (75%).  HENT:   Positive for sore throat.   Gastrointestinal:  Positive for nausea and vomiting.  Psychiatric/Behavioral:   Positive for depression.   All other systems reviewed and are negative.  PAST MEDICAL/SURGICAL HISTORY:  Past Medical History:  Diagnosis Date   Acute upper GI bleed 10/13/2020   gastric ulcer with visible vessel   Alcohol abuse 10/13/2020   Quit Jan 2022   GERD (gastroesophageal reflux disease)    Port-A-Cath in place 06/23/2021   Positive H. pylori test 09/2020   H pylori IgG + Jan 2022 s/p treatment with Prevpac. EGD March 2022 with gastric biopsy negative for H. pylori.    Past Surgical History:  Procedure Laterality Date   BIOPSY  12/03/2020   Procedure: BIOPSY;  Surgeon: Eloise Harman, DO;  [facility];  Service: Endoscopy;;   BIOPSY  05/12/2021   Procedure: BIOPSY;  Surgeon: Eloise Harman, DO;  [facility];  Service: Endoscopy;;   ESOPHAGOGASTRODUODENOSCOPY (EGD) WITH PROPOFOL N/A 10/14/2020    Surgeon: Eloise Harman, DO;  one non-bleeding, non-obstructing gastric ulcer with visible vessel s/p bipolar cautery, normal examined esophagus and duodenum.    ESOPHAGOGASTRODUODENOSCOPY (EGD) WITH PROPOFOL N/A 12/03/2020    Surgeon: Eloise Harman, DO;   nonobstructing, nonbleeding gastric ulcer with clean base s/p biopsied, gastritis biopsied, normal examined duodenum.  Pathology with granulation tissue consistent with ulcer, mild chronic gastritis with focal intestinal metaplasia, H. pylori negative.  Repeat in 3 months.   ESOPHAGOGASTRODUODENOSCOPY (EGD) WITH PROPOFOL N/A 05/12/2021   Procedure: ESOPHAGOGASTRODUODENOSCOPY (EGD) WITH PROPOFOL;  Surgeon: Eloise Harman, DO;  [facility];  Service: Endoscopy;  Laterality: N/A;  10:30am   ESOPHAGOGASTRODUODENOSCOPY (EGD) WITH PROPOFOL N/A 06/19/2021   Procedure: ESOPHAGOGASTRODUODENOSCOPY (EGD) WITH PROPOFOL;  Surgeon: [name], MD;  Location: [facility];  Service: Endoscopy;  Laterality: N/A;   EUS N/A 06/19/2021   Procedure: UPPER ENDOSCOPIC ULTRASOUND (EUS) RADIAL;  Surgeon:  [name], MD;  Location: [facility];  Service: Endoscopy;  Laterality: N/A;   PORTACATH PLACEMENT Left 06/23/2021   Procedure: INSERTION PORT-A-CATH;  Surgeon: Aviva Signs, MD;  Location: [facility];  Service: General;  Laterality: Left;    SOCIAL HISTORY:  Social History   Socioeconomic History   Marital status: Married    Spouse name: Not on file   Number of children: Not on file   Years of education: Not on file   Highest education level: Not on file  Occupational History   Not on file  Tobacco Use   Smoking status: Never   Smokeless tobacco: Never  Vaping Use   Vaping Use: Never used  Substance and Sexual Activity   Alcohol use: Not Currently    Alcohol/week: 57.0 - 58.0 standard drinks    Types: 54 Cans of beer, 3 - 4 Shots of liquor per week    Comment: Quit January 2022. Used to drink sixpack daily and 12-15 beers on Saturdays and Sundays.    Drug use: Never   Sexual activity: Yes  Other Topics Concern   Not on file  Social History Narrative   Not on file   Social Determinants of Health   Financial Resource Strain: High Risk   Difficulty of Paying Living Expenses: Very hard  Food Insecurity: No Food Insecurity   Worried About Running Out of Food in the Last Year: Never true   Ran Out of Food in the Last Year: Never true  Transportation Needs: No Transportation Needs   Lack of Transportation (Medical): No   Lack of Transportation (Non-Medical): No  Physical Activity: Sufficiently Active   Days of Exercise per Week: 5 days   Minutes of Exercise per Session: 60 min  Stress: No Stress Concern Present   Feeling of Stress : Not at all  Social Connections: Moderately Isolated   Frequency of Communication with Friends and Family: More than three times a week   Frequency of Social Gatherings with Friends and Family: More than three times a week   Attends Religious Services: Never   Marine scientist or Organizations: No   Attends Arts administrator: Never   Marital Status: Married  Human resources officer Violence: Not At Risk   Fear of Current or Ex-Partner: No   Emotionally Abused: No   Physically Abused: No   Sexually Abused: No    FAMILY HISTORY:  Family History  Problem Relation Age of Onset   Diabetes Mellitus II Mother    Hypertension Mother    Colon cancer Neg Hx    Colon polyps Neg Hx     CURRENT MEDICATIONS:  Current Outpatient Medications  Medication Sig Dispense Refill   DOCETAXEL IV Inject into the vein every 14 (fourteen) days.     fluorouracil CALGB 95638 2,400 mg/m2 in sodium chloride 0.9 % 150 mL Inject 2,600 mg/m2 into the vein over 24 hr.     FLUOROURACIL IV Inject into the vein every 14 (fourteen) days.     HYDROcodone-acetaminophen (NORCO) 5-325 MG tablet Take 1 tablet by mouth every 4 (four) hours as needed for moderate pain. 20 tablet 0   lidocaine-prilocaine (EMLA) cream Apply a small  amount to port a cath site and cover with plastic wrap 1 hour prior to chemotherapy appointments 30 g 3   Multiple Vitamin (MULTIVITAMIN WITH MINERALS) TABS tablet Take 1 tablet by mouth in the morning.     ondansetron (ZOFRAN ODT) 4 MG disintegrating tablet Take 1 tablet (4 mg total) by mouth every 8 (eight) hours as needed for nausea or vomiting. 60 tablet 6   OXALIPLATIN IV Inject into the vein every 14 (fourteen) days.     pantoprazole (PROTONIX) 40 MG tablet Take 1 tablet (40 mg total) by mouth daily. 1 tablet 30 tablet 6   prochlorperazine (COMPAZINE) 10 MG tablet Take 1 tablet (10 mg total) by mouth every 6 (six) hours as needed (Nausea or vomiting). 30 tablet 1   sucralfate (CARAFATE) 1 GM/10ML suspension Take 10 mLs (1 g total) by mouth 4 (four) times daily -  with meals and at bedtime. 420 mL 0   No current facility-administered medications for this visit.    ALLERGIES:  No Known Allergies  PHYSICAL EXAM:  Performance status (ECOG): 0 - Asymptomatic  Vitals:   09/04/21 0753  BP: 131/87  Pulse: 70   Resp: 18  Temp: (!) 96.7 F (35.9 C)  SpO2: 100%   Wt Readings from Last 3 Encounters:  08/21/21 158 lb (71.7 kg)  08/06/21 154 lb 6 oz (70 kg)  07/23/21 158 lb 14.4 oz (72.1 kg)   Physical Exam Vitals reviewed.  Constitutional:      Appearance: Normal appearance.  Cardiovascular:     Rate and Rhythm: Normal rate and regular rhythm.     Pulses: Normal pulses.     Heart sounds: Normal heart sounds.  Pulmonary:     Effort: Pulmonary effort is normal.     Breath sounds: Normal breath sounds.  Abdominal:     Palpations: Abdomen is soft. There is no mass.     Tenderness: There is no abdominal tenderness.  Neurological:     General: No focal deficit present.     Mental Status: He is alert and oriented to person, place, and time.  Psychiatric:        Mood and Affect: Mood normal.        Behavior: Behavior normal.    LABORATORY DATA:  I have reviewed the labs as listed.  CBC Latest Ref Rng & Units 09/04/2021 08/21/2021 08/06/2021  WBC 4.0 - 10.5 K/uL 7.6 5.8 6.8  Hemoglobin 13.0 - 17.0 g/dL 12.9(L) 11.6(L) 10.9(L)  Hematocrit 39.0 - 52.0 % 40.5 36.9(L) 35.1(L)  Platelets 150 - 400 K/uL 190 175 207   CMP Latest Ref Rng & Units 09/04/2021 08/21/2021 08/06/2021  Glucose 70 - 99 mg/dL 128(H) 142(H) 163(H)  BUN 6 - 20 mg/dL 11 10 6   Creatinine 0.61 - 1.24 mg/dL 0.92 0.81 0.79  Sodium 135 - 145 mmol/L 137 139 137  Potassium 3.5 - 5.1 mmol/L 3.8 3.9 3.8  Chloride 98 - 111 mmol/L 103 106 105  CO2 22 - 32 mmol/L 26 28 26   Calcium 8.9 - 10.3 mg/dL 8.7(L) 8.8(L) 8.6(L)  Total Protein 6.5 - 8.1 g/dL 6.9 6.5 6.8  Total Bilirubin 0.3 - 1.2 mg/dL 0.5 0.2(L) 0.6  Alkaline Phos 38 - 126 U/L 101 99 118  AST 15 - 41 U/L 56(H) 34 35  ALT 0 - 44 U/L 104(H) 46(H) 50(H)    DIAGNOSTIC IMAGING:  I have independently reviewed the scans and discussed with the patient. No results found.   ASSESSMENT:  1.  Gastric antral adenocarcinoma: - EGD on 05/12/2021 gastric antral mass with large  cratered ulcer.  Normal duodenal bulb and first part of the duodenum. - Biopsy of the antrum ulcer consistent with adenocarcinoma, at least intramucosal.  Depth of invasion cannot be judged as the biopsy was superficial. - CT CAP on 05/14/2021 with gastric antral soft tissue thickening/mass measuring 3.7 x 3 cm.  1.2 cm portacaval lymph node.  Node within the perigastric fat anterior to the pylorus measures 5 mm.  No evidence of distant metastatic disease in the abdomen or pelvis.  Isolated 4 mm right middle lobe lung nodule most likely incidental/benign. - 13 pound weight loss in the last 3 months. - PET scan on 05/29/2021 showed mass in the gastric antrum FDG avid with SUV 5.32.  No definite evidence of FDG avid nodal metastasis or distant metastatic disease.  There is only low-level FDG uptake associated with previously described prominent upper abdominal lymph nodes. - EGD/EUS on 06/19/2021-uT3N1 4 cm cratered noncircumferential distal gastric adenocarcinoma.  There is one suspicious 9 mm perigastric lymph node nearby the primary gastric mass.  2.  Social/family history: - He is married and seen with his wife today. - He does Architect work. - He quit smoking cigarettes 13 years ago.  Quit drinking alcohol in March 2022. - No family history of malignancies.   PLAN:  1.  Stage III (uT3 N1) gastric adenocarcinoma: - He denies any tingling or numbness or cold sensitivity. - He reported nausea lasting about 2 weeks.  Denies any vomiting. - He felt full and did not want to eat.  Reports having abdominal discomfort in the epigastric region and left upper quadrant which lasted about 3 days after last treatment.  It felt like he was punched in the stomach. - He is taking Protonix.  He reports stomach pain on taking Maalox. - I have recommended to start taking Carafate which she has at home. - Reviewed labs today which showed elevated AST and ALT of 56 and 104 respectively.  This is slightly higher  than before.  Hemoglobin, white count and platelets are normal.  Last CEA normalized at 2.3. - Proceed with cycle 6 today.  RTC 2 weeks with CT scan of the abdomen with contrast as he is complaining of abdominal pain.  2.  Weight loss: - He lost about 2 to 3 pounds in the last 2 weeks.  3.  Abdominal pain: - No abdominal pain since chemotherapy started.  4.  Constipation: - He is not requiring any stool softener.  5.  Normocytic anemia: - Feraheme on 07/31/2021 on 08/06/2021. - Hemoglobin today is 12.9.   Orders placed this encounter:  Orders Placed This Encounter  Procedures   CT Abdomen Pelvis W Contrast   Addendum: Few minutes and oxaliplatin infusion, he developed reaction with itching in the palms and soles.  He also had nausea and vomited.  We have given him Solu-Medrol, Benadryl and Pepcid.  After 15 to 20 minutes, we resumed oxaliplatin without any events.  We will premedicate prior to next oxaliplatin.   Derek Jack, MD [facility] [phone]   I, Thana Ates, am acting as a scribe for Dr. Derek Jack.  I, Derek Jack MD, have reviewed the above documentation for accuracy and completeness, and I agree with the above.

## 2021-09-04 ENCOUNTER — Inpatient Hospital Stay (HOSPITAL_COMMUNITY): Payer: Self-pay

## 2021-09-04 ENCOUNTER — Other Ambulatory Visit: Payer: Self-pay

## 2021-09-04 ENCOUNTER — Telehealth (HOSPITAL_COMMUNITY): Payer: Self-pay | Admitting: Hematology

## 2021-09-04 ENCOUNTER — Other Ambulatory Visit (HOSPITAL_COMMUNITY): Payer: Self-pay

## 2021-09-04 ENCOUNTER — Encounter (HOSPITAL_COMMUNITY): Payer: Self-pay | Admitting: Hematology

## 2021-09-04 ENCOUNTER — Inpatient Hospital Stay (HOSPITAL_BASED_OUTPATIENT_CLINIC_OR_DEPARTMENT_OTHER): Payer: Self-pay | Admitting: Hematology

## 2021-09-04 VITALS — BP 125/81 | HR 73 | Temp 98.2°F | Resp 18 | Wt 155.2 lb

## 2021-09-04 VITALS — BP 131/87 | HR 70 | Temp 96.7°F | Resp 18 | Ht 66.0 in

## 2021-09-04 DIAGNOSIS — C163 Malignant neoplasm of pyloric antrum: Secondary | ICD-10-CM

## 2021-09-04 DIAGNOSIS — Z5111 Encounter for antineoplastic chemotherapy: Secondary | ICD-10-CM | POA: Diagnosis not present

## 2021-09-04 DIAGNOSIS — C169 Malignant neoplasm of stomach, unspecified: Secondary | ICD-10-CM

## 2021-09-04 DIAGNOSIS — Z95828 Presence of other vascular implants and grafts: Secondary | ICD-10-CM

## 2021-09-04 LAB — COMPREHENSIVE METABOLIC PANEL
ALT: 104 U/L — ABNORMAL HIGH (ref 0–44)
AST: 56 U/L — ABNORMAL HIGH (ref 15–41)
Albumin: 3.9 g/dL (ref 3.5–5.0)
Alkaline Phosphatase: 101 U/L (ref 38–126)
Anion gap: 8 (ref 5–15)
BUN: 11 mg/dL (ref 6–20)
CO2: 26 mmol/L (ref 22–32)
Calcium: 8.7 mg/dL — ABNORMAL LOW (ref 8.9–10.3)
Chloride: 103 mmol/L (ref 98–111)
Creatinine, Ser: 0.92 mg/dL (ref 0.61–1.24)
GFR, Estimated: >60 mL/min (ref >60)
Glucose, Bld: 128 mg/dL — ABNORMAL HIGH (ref 70–99)
Potassium: 3.8 mmol/L (ref 3.5–5.1)
Sodium: 137 mmol/L (ref 135–145)
Total Bilirubin: 0.5 mg/dL (ref 0.3–1.2)
Total Protein: 6.9 g/dL (ref 6.5–8.1)

## 2021-09-04 LAB — CBC WITH DIFFERENTIAL/PLATELET
Abs Immature Granulocytes: 0.27 10*3/uL — ABNORMAL HIGH (ref 0.00–0.07)
Basophils Absolute: 0.1 10*3/uL (ref 0.0–0.1)
Basophils Relative: 1 %
Eosinophils Absolute: 0.1 10*3/uL (ref 0.0–0.5)
Eosinophils Relative: 1 %
HCT: 40.5 % (ref 39.0–52.0)
Hemoglobin: 12.9 g/dL — ABNORMAL LOW (ref 13.0–17.0)
Immature Granulocytes: 4 %
Lymphocytes Relative: 25 %
Lymphs Abs: 1.9 10*3/uL (ref 0.7–4.0)
MCH: 25.8 pg — ABNORMAL LOW (ref 26.0–34.0)
MCHC: 31.9 g/dL (ref 30.0–36.0)
MCV: 81 fL (ref 80.0–100.0)
Monocytes Absolute: 0.6 10*3/uL (ref 0.1–1.0)
Monocytes Relative: 8 %
Neutro Abs: 4.7 10*3/uL (ref 1.7–7.7)
Neutrophils Relative %: 61 %
Platelets: 190 10*3/uL (ref 150–400)
RBC: 5 MIL/uL (ref 4.22–5.81)
RDW: 23.6 % — ABNORMAL HIGH (ref 11.5–15.5)
WBC Morphology: REACTIVE
WBC: 7.6 10*3/uL (ref 4.0–10.5)
nRBC: 0 % (ref 0.0–0.2)

## 2021-09-04 LAB — MAGNESIUM: Magnesium: 2.3 mg/dL (ref 1.7–2.4)

## 2021-09-04 MED ORDER — METHYLPREDNISOLONE SODIUM SUCC 125 MG IJ SOLR
125.0000 mg | Freq: Once | INTRAMUSCULAR | Status: AC | PRN
  Administered 2021-09-04: 125 mg via INTRAVENOUS

## 2021-09-04 MED ORDER — SODIUM CHLORIDE 0.9 % IV SOLN
5000.0000 mg | INTRAVENOUS | Status: DC
  Administered 2021-09-04: 5000 mg via INTRAVENOUS
  Filled 2021-09-04: qty 100

## 2021-09-04 MED ORDER — LEUCOVORIN CALCIUM INJECTION 350 MG
200.0000 mg/m2 | Freq: Once | INTRAVENOUS | Status: AC
  Administered 2021-09-04: 366 mg via INTRAVENOUS
  Filled 2021-09-04: qty 18.3

## 2021-09-04 MED ORDER — SODIUM CHLORIDE 0.9% FLUSH: 10.0000 mL | INTRAVENOUS | Status: DC | PRN

## 2021-09-04 MED ORDER — PROCHLORPERAZINE MALEATE 10 MG PO TABS: 10.0000 mg | ORAL_TABLET | Freq: Four times a day (QID) | ORAL | 6 refills | Status: DC | PRN

## 2021-09-04 MED ORDER — LEUCOVORIN CALCIUM INJECTION 350 MG
200.0000 mg/m2 | Freq: Once | INTRAVENOUS | Status: DC
  Filled 2021-09-04: qty 18.3

## 2021-09-04 MED ORDER — HEPARIN SOD (PORK) LOCK FLUSH 100 UNIT/ML IV SOLN: 500.0000 [IU] | Freq: Once | INTRAVENOUS | Status: DC | PRN

## 2021-09-04 MED ORDER — SODIUM CHLORIDE 0.9 % IV SOLN
50.0000 mg/m2 | Freq: Once | INTRAVENOUS | Status: AC
  Administered 2021-09-04: 90 mg via INTRAVENOUS
  Filled 2021-09-04: qty 9

## 2021-09-04 MED ORDER — DEXTROSE 5 % IV SOLN: Freq: Once | INTRAVENOUS | Status: AC

## 2021-09-04 MED ORDER — SODIUM CHLORIDE 0.9 % IV SOLN
10.0000 mg | Freq: Once | INTRAVENOUS | Status: AC
  Administered 2021-09-04: 10 mg via INTRAVENOUS
  Filled 2021-09-04: qty 10

## 2021-09-04 MED ORDER — FAMOTIDINE 20 MG IN NS 100 ML IVPB
20.0000 mg | Freq: Once | INTRAVENOUS | Status: DC | PRN
  Filled 2021-09-04: qty 100

## 2021-09-04 MED ORDER — FAMOTIDINE IN NACL 20-0.9 MG/50ML-% IV SOLN
20.0000 mg | Freq: Two times a day (BID) | INTRAVENOUS | Status: DC
  Administered 2021-09-04: 20 mg via INTRAVENOUS
  Filled 2021-09-04 (×3): qty 50

## 2021-09-04 MED ORDER — DIPHENHYDRAMINE HCL 50 MG/ML IJ SOLN
50.0000 mg | Freq: Once | INTRAMUSCULAR | Status: AC | PRN
  Administered 2021-09-04: 25 mg via INTRAVENOUS

## 2021-09-04 MED ORDER — SODIUM CHLORIDE 0.9 % IV SOLN: INTRAVENOUS | Status: DC

## 2021-09-04 MED ORDER — OXALIPLATIN CHEMO INJECTION 100 MG/20ML
83.0000 mg/m2 | Freq: Once | INTRAVENOUS | Status: AC
  Administered 2021-09-04: 150 mg via INTRAVENOUS
  Filled 2021-09-04: qty 20

## 2021-09-04 MED ORDER — PALONOSETRON HCL INJECTION 0.25 MG/5ML
0.2500 mg | Freq: Once | INTRAVENOUS | Status: AC
  Administered 2021-09-04: 0.25 mg via INTRAVENOUS
  Filled 2021-09-04: qty 5

## 2021-09-04 NOTE — Progress Notes (Signed)
Patient presents today for FLOT infusion and pump start per providers order.  Vital signs within parameters for treatment.  Labs pending.  Patient complains that he has had more nausea after his last treatment than he normally does.  He also has had some itching to the palms of his hands and soles of his feet.  Advised the patient to let the MD know during his office visit.  1352  Vital signs WNL.  Patient is no longer having nausea, vomiting, or itching.  Dr. Delton Coombes notified and okay to resume chemotherapy at this time.  FLOT infusion with pump start given today per MD orders.  Stable during the remaining infusion without adverse affects.  Vital signs stable.  No complaints at this time.  Discharge from clinic ambulatory in stable condition.  Alert and oriented X 3.  Follow up with [facility] as scheduled.

## 2021-09-04 NOTE — Progress Notes (Signed)
Hypersensitivity Reaction note  Date of event: 09/04/21 Time of event: 1250 Generic name of drug involved: oxaliplatin Name of provider notified of the hypersensitivity reaction: Dr. Bernita Raisin Was agent that likely caused hypersensitivity reaction added to Allergies List within EMR? yes Chain of events including reaction signs/symptoms, treatment administered, and outcome (e.g., drug resumed; drug discontinued; sent to Emergency Department; etc.) reaction meds given, then rechallenged with same chemotherapy  Aasia Peavler, Beckie Salts, RN 09/04/2021 3:51 PM 1250 patient called out. Wife at bedside helping pt to bathroom, patient is vomiting. Complaints of hand and feet itching. Chemotherapy stopped and MD notified.   Vitals obtained, MD in room, additional meds given per orders. Vitals obtained see flowsheet.  Will monitor for 30 mins and reevaluate.

## 2021-09-04 NOTE — Patient Instructions (Signed)
[facility]  Discharge Instructions: Thank you for choosing [facility] to provide your oncology and hematology care.  If you have a lab appointment with the [facility], please come in thru the Main Entrance and check in at the main information desk.  Wear comfortable clothing and clothing appropriate for easy access to any Portacath or PICC line.   We strive to give you quality time with your provider. You may need to reschedule your appointment if you arrive late (15 or more minutes).  Arriving late affects you and other patients whose appointments are after yours.  Also, if you miss three or more appointments without notifying the office, you may be dismissed from the clinic at the providers discretion.      For prescription refill requests, have your pharmacy contact our office and allow 72 hours for refills to be completed.    Today you received the following chemotherapy and/or immunotherapy agents FLOT      To help prevent nausea and vomiting after your treatment, we encourage you to take your nausea medication as directed.  BELOW ARE SYMPTOMS THAT SHOULD BE REPORTED IMMEDIATELY: *FEVER GREATER THAN 100.4 F (38 C) OR HIGHER *CHILLS OR SWEATING *NAUSEA AND VOMITING THAT IS NOT CONTROLLED WITH YOUR NAUSEA MEDICATION *UNUSUAL SHORTNESS OF BREATH *UNUSUAL BRUISING OR BLEEDING *URINARY PROBLEMS (pain or burning when urinating, or frequent urination) *BOWEL PROBLEMS (unusual diarrhea, constipation, pain near the anus) TENDERNESS IN MOUTH AND THROAT WITH OR WITHOUT PRESENCE OF ULCERS (sore throat, sores in mouth, or a toothache) UNUSUAL RASH, SWELLING OR PAIN  UNUSUAL VAGINAL DISCHARGE OR ITCHING   Items with * indicate a potential emergency and should be followed up as soon as possible or go to the Emergency Department if any problems should occur.  Please show the CHEMOTHERAPY ALERT CARD or IMMUNOTHERAPY ALERT CARD at check-in to the Emergency Department  and triage nurse.  Should you have questions after your visit or need to cancel or reschedule your appointment, please contact [facility] [phone]  and follow the prompts.  Office hours are 8:00 a.m. to 4:30 p.m. Monday - Friday. Please note that voicemails left after 4:00 p.m. may not be returned until the following business day.  We are closed weekends and major holidays. You have access to a nurse at all times for urgent questions. Please call the main number to the clinic [phone] and follow the prompts.  For any non-urgent questions, you may also contact your provider using MyChart. We now offer e-Visits for anyone 33 and older to request care online for non-urgent symptoms. For details visit [email or web address].   Also download the MyChart app! Go to the app store, search "MyChart", open the app, select [facility], and log in with your MyChart username and password.  Due to Covid, a mask is required upon entering the hospital/clinic. If you do not have a mask, one will be given to you upon arrival. For doctor visits, patients may have 1 support person aged 67 or older with them. For treatment visits, patients cannot have anyone with them due to current Covid guidelines and our immunocompromised population.

## 2021-09-04 NOTE — Patient Instructions (Signed)
[place] at [facility] Discharge Instructions   You were seen and examined today by Dr. Delton Coombes. We will proceed with your treatment today. We will send a prescription for carafate to help with your abdominal pain. Return as scheduled for lab work, office visit, and treatment.     Thank you for choosing [place] at [facility] to provide your oncology and hematology care.  To afford each patient quality time with our provider, please arrive at least 15 minutes before your scheduled appointment time.   If you have a lab appointment with the [facility] please come in thru the Main Entrance and check in at the main information desk.  You need to re-schedule your appointment should you arrive 10 or more minutes late.  We strive to give you quality time with our providers, and arriving late affects you and other patients whose appointments are after yours.  Also, if you no show three or more times for appointments you may be dismissed from the clinic at the providers discretion.     Again, thank you for choosing [facility].  Our hope is that these requests will decrease the amount of time that you wait before being seen by our physicians.       _____________________________________________________________  Should you have questions after your visit to [facility], please contact our office at [phone] and follow the prompts.  Our office hours are 8:00 a.m. and 4:30 p.m. Monday - Friday.  Please note that voicemails left after 4:00 p.m. may not be returned until the following business day.  We are closed weekends and major holidays.  You do have access to a nurse 24-7, just call the main number to the clinic [phone] and do not press any options, hold on the line and a nurse will answer the phone.    For prescription refill requests, have your pharmacy contact our office and allow 72 hours.    Due to Covid,  you will need to wear a mask upon entering the hospital. If you do not have a mask, a mask will be given to you at the Main Entrance upon arrival. For doctor visits, patients may have 1 support person age 29 or older with them. For treatment visits, patients can not have anyone with them due to social distancing guidelines and our immunocompromised population.

## 2021-09-04 NOTE — Progress Notes (Signed)
Patient has been examined by Dr. Katragadda, and vital signs and labs have been reviewed. ANC, Creatinine, LFTs, hemoglobin, and platelets are within treatment parameters per M.D. - pt may proceed with treatment.    °

## 2021-09-04 NOTE — Telephone Encounter (Signed)
Spoke to pts wife regarding Ins coverage. Advised her that [facility] states pts cov is termed. She stated he has new ins but no card. I advised her to find info asap because all tx and etc would be billed as self pay

## 2021-09-05 ENCOUNTER — Inpatient Hospital Stay (HOSPITAL_COMMUNITY): Payer: Self-pay

## 2021-09-05 DIAGNOSIS — Z5111 Encounter for antineoplastic chemotherapy: Secondary | ICD-10-CM | POA: Diagnosis not present

## 2021-09-05 DIAGNOSIS — Z95828 Presence of other vascular implants and grafts: Secondary | ICD-10-CM

## 2021-09-05 DIAGNOSIS — C163 Malignant neoplasm of pyloric antrum: Secondary | ICD-10-CM

## 2021-09-05 MED ORDER — HEPARIN SOD (PORK) LOCK FLUSH 100 UNIT/ML IV SOLN
500.0000 [IU] | Freq: Once | INTRAVENOUS | Status: AC | PRN
  Administered 2021-09-05: 500 [IU]

## 2021-09-05 MED ORDER — PEGFILGRASTIM-CBQV 6 MG/0.6ML ~~LOC~~ SOSY
6.0000 mg | PREFILLED_SYRINGE | Freq: Once | SUBCUTANEOUS | Status: AC
  Administered 2021-09-05: 6 mg via SUBCUTANEOUS
  Filled 2021-09-05: qty 0.6

## 2021-09-05 MED ORDER — SODIUM CHLORIDE 0.9% FLUSH
10.0000 mL | INTRAVENOUS | Status: DC | PRN
  Administered 2021-09-05: 10 mL

## 2021-09-05 NOTE — Progress Notes (Signed)
Patient presents today for pump d/c. Port a cath site clean, dry, and intact. Port flushed with 10 mls of Normal Saline and 500 Units of Heparin. Needle removed intact. Band aid applied. Patient has no complaints at this time. Discharged from clinic ambulatory and in stable condition. Patient alert and oriented.

## 2021-09-05 NOTE — Patient Instructions (Signed)
[facility]  Discharge Instructions: Thank you for choosing [facility] to provide your oncology and hematology care.  If you have a lab appointment with the [facility], please come in thru the Main Entrance and check in at the main information desk.  Wear comfortable clothing and clothing appropriate for easy access to any Portacath or PICC line.   We strive to give you quality time with your provider. You may need to reschedule your appointment if you arrive late (15 or more minutes).  Arriving late affects you and other patients whose appointments are after yours.  Also, if you miss three or more appointments without notifying the office, you may be dismissed from the clinic at the providers discretion.      For prescription refill requests, have your pharmacy contact our office and allow 72 hours for refills to be completed.    Today you received the following: Adrucil 5FU pump.       To help prevent nausea and vomiting after your treatment, we encourage you to take your nausea medication as directed.  BELOW ARE SYMPTOMS THAT SHOULD BE REPORTED IMMEDIATELY: *FEVER GREATER THAN 100.4 F (38 C) OR HIGHER *CHILLS OR SWEATING *NAUSEA AND VOMITING THAT IS NOT CONTROLLED WITH YOUR NAUSEA MEDICATION *UNUSUAL SHORTNESS OF BREATH *UNUSUAL BRUISING OR BLEEDING *URINARY PROBLEMS (pain or burning when urinating, or frequent urination) *BOWEL PROBLEMS (unusual diarrhea, constipation, pain near the anus) TENDERNESS IN MOUTH AND THROAT WITH OR WITHOUT PRESENCE OF ULCERS (sore throat, sores in mouth, or a toothache) UNUSUAL RASH, SWELLING OR PAIN  UNUSUAL VAGINAL DISCHARGE OR ITCHING   Items with * indicate a potential emergency and should be followed up as soon as possible or go to the Emergency Department if any problems should occur.  Please show the CHEMOTHERAPY ALERT CARD or IMMUNOTHERAPY ALERT CARD at check-in to the Emergency Department and triage  nurse.  Should you have questions after your visit or need to cancel or reschedule your appointment, please contact [facility] [phone]  and follow the prompts.  Office hours are 8:00 a.m. to 4:30 p.m. Monday - Friday. Please note that voicemails left after 4:00 p.m. may not be returned until the following business day.  We are closed weekends and major holidays. You have access to a nurse at all times for urgent questions. Please call the main number to the clinic [phone] and follow the prompts.  For any non-urgent questions, you may also contact your provider using MyChart. We now offer e-Visits for anyone 24 and older to request care online for non-urgent symptoms. For details visit [email or web address].   Also download the MyChart app! Go to the app store, search "MyChart", open the app, select , and log in with your MyChart username and password.  Due to Covid, a mask is required upon entering the hospital/clinic. If you do not have a mask, one will be given to you upon arrival. For doctor visits, patients may have 1 support person aged 74 or older with them. For treatment visits, patients cannot have anyone with them due to current Covid guidelines and our immunocompromised population.

## 2021-09-11 ENCOUNTER — Other Ambulatory Visit: Payer: Self-pay

## 2021-09-11 ENCOUNTER — Encounter (HOSPITAL_BASED_OUTPATIENT_CLINIC_OR_DEPARTMENT_OTHER): Payer: Self-pay

## 2021-09-11 ENCOUNTER — Ambulatory Visit (HOSPITAL_BASED_OUTPATIENT_CLINIC_OR_DEPARTMENT_OTHER)
Admission: RE | Admit: 2021-09-11 | Discharge: 2021-09-11 | Disposition: A | Discharge status: Home / Self Care | Payer: Self-pay | Source: Ambulatory Visit | Attending: Hematology | Admitting: Hematology

## 2021-09-11 DIAGNOSIS — C169 Malignant neoplasm of stomach, unspecified: Secondary | ICD-10-CM | POA: Insufficient documentation

## 2021-09-11 MED ORDER — IOHEXOL 300 MG/ML  SOLN
80.0000 mL | Freq: Once | INTRAMUSCULAR | Status: AC | PRN
  Administered 2021-09-11: 08:00:00 80 mL via INTRAVENOUS

## 2021-09-17 ENCOUNTER — Other Ambulatory Visit (HOSPITAL_COMMUNITY): Payer: 59

## 2021-09-17 ENCOUNTER — Other Ambulatory Visit: Payer: Self-pay

## 2021-09-17 ENCOUNTER — Inpatient Hospital Stay (HOSPITAL_COMMUNITY): Payer: Self-pay

## 2021-09-17 DIAGNOSIS — C163 Malignant neoplasm of pyloric antrum: Secondary | ICD-10-CM

## 2021-09-17 DIAGNOSIS — C169 Malignant neoplasm of stomach, unspecified: Secondary | ICD-10-CM

## 2021-09-17 DIAGNOSIS — Z5111 Encounter for antineoplastic chemotherapy: Secondary | ICD-10-CM | POA: Diagnosis not present

## 2021-09-17 DIAGNOSIS — Z95828 Presence of other vascular implants and grafts: Secondary | ICD-10-CM

## 2021-09-17 LAB — CBC WITH DIFFERENTIAL/PLATELET
Abs Immature Granulocytes: 0.09 10*3/uL — ABNORMAL HIGH (ref 0.00–0.07)
Basophils Absolute: 0 10*3/uL (ref 0.0–0.1)
Basophils Relative: 1 %
Eosinophils Absolute: 0.1 10*3/uL (ref 0.0–0.5)
Eosinophils Relative: 1 %
HCT: 39.5 % (ref 39.0–52.0)
Hemoglobin: 12.7 g/dL — ABNORMAL LOW (ref 13.0–17.0)
Immature Granulocytes: 2 %
Lymphocytes Relative: 32 %
Lymphs Abs: 1.8 10*3/uL (ref 0.7–4.0)
MCH: 26.4 pg (ref 26.0–34.0)
MCHC: 32.2 g/dL (ref 30.0–36.0)
MCV: 82.1 fL (ref 80.0–100.0)
Monocytes Absolute: 0.5 10*3/uL (ref 0.1–1.0)
Monocytes Relative: 9 %
Neutro Abs: 3.2 10*3/uL (ref 1.7–7.7)
Neutrophils Relative %: 55 %
Platelets: 202 10*3/uL (ref 150–400)
RBC: 4.81 MIL/uL (ref 4.22–5.81)
RDW: 23.6 % — ABNORMAL HIGH (ref 11.5–15.5)
WBC: 5.7 10*3/uL (ref 4.0–10.5)
nRBC: 0 % (ref 0.0–0.2)

## 2021-09-17 LAB — COMPREHENSIVE METABOLIC PANEL
ALT: 86 U/L — ABNORMAL HIGH (ref 0–44)
AST: 53 U/L — ABNORMAL HIGH (ref 15–41)
Albumin: 3.9 g/dL (ref 3.5–5.0)
Alkaline Phosphatase: 95 U/L (ref 38–126)
Anion gap: 6 (ref 5–15)
BUN: 13 mg/dL (ref 6–20)
CO2: 26 mmol/L (ref 22–32)
Calcium: 8.7 mg/dL — ABNORMAL LOW (ref 8.9–10.3)
Chloride: 106 mmol/L (ref 98–111)
Creatinine, Ser: 0.73 mg/dL (ref 0.61–1.24)
GFR, Estimated: >60 mL/min (ref >60)
Glucose, Bld: 142 mg/dL — ABNORMAL HIGH (ref 70–99)
Potassium: 3.8 mmol/L (ref 3.5–5.1)
Sodium: 138 mmol/L (ref 135–145)
Total Bilirubin: 0.6 mg/dL (ref 0.3–1.2)
Total Protein: 6.6 g/dL (ref 6.5–8.1)

## 2021-09-17 LAB — MAGNESIUM: Magnesium: 2.4 mg/dL (ref 1.7–2.4)

## 2021-09-17 NOTE — Progress Notes (Signed)
Steven Martinez, Steven Martinez [id number]   CLINIC:  Medical Oncology/Hematology  PCP:  [address] / [place] [address] [phone]   REASON FOR VISIT:  Follow-up for adenocarcinoma of the stomach  PRIOR THERAPY: none  NGS Results: not done  CURRENT THERAPY: Gastroesophageal FLOT every 2 weeks for 4 cycles   BRIEF ONCOLOGIC HISTORY:  Oncology History  Gastric cancer ([place])  06/19/2021 Initial Diagnosis   Gastric cancer ([place])   06/25/2021 -  Chemotherapy   Patient is on Treatment Plan : GASTROESOPHAGEAL FLOT q14d X 4 cycles       CANCER STAGING:  Cancer Staging  Gastric cancer ([place]) Staging form: Stomach, AJCC 8th Edition - Clinical stage from 06/19/2021: Stage III (cT3, cN1, cM0) - Unsigned   INTERVAL HISTORY:  [name], a 30 y.o. male, returns for routine follow-up and consideration for next cycle of chemotherapy. Lysander was last seen on 09/04/2021.  Due for cycle #7 of FLOT today.   Overall, he tells me he has been feeling pretty well, and he is accompanied by his wife who is acting as interpreter. He reports improved nausea. He reports constant numbness and tingling in his index fingers and thumbs bilaterally; he denies any associated pain. He reports several episodes of vomiting following previous treatment. He reports occasional abdominal pain. He denies mouth sores.   Overall, he feels ready for next cycle of chemo today.   REVIEW OF SYSTEMS:  Review of Systems  Constitutional:  Negative for appetite change (70%) and fatigue (70%).  HENT:   Negative for mouth sores.   Gastrointestinal:  Positive for abdominal pain (occasional), nausea and vomiting.  Neurological:  Positive for numbness.  Psychiatric/Behavioral:  The patient is nervous/anxious.   All other systems reviewed and are negative.  PAST MEDICAL/SURGICAL HISTORY:  Past Medical History:  Diagnosis Date   Acute upper GI  bleed 10/13/2020   gastric ulcer with visible vessel   Alcohol abuse 10/13/2020   Quit Jan 2022   GERD (gastroesophageal reflux disease)    Port-A-Cath in place 06/23/2021   Positive H. pylori test 09/2020   H pylori IgG + Jan 2022 s/p treatment with Prevpac. EGD March 2022 with gastric biopsy negative for H. pylori.    Past Surgical History:  Procedure Laterality Date   BIOPSY  12/03/2020   Procedure: BIOPSY;  Surgeon: Eloise Harman, DO;  [facility];  Service: Endoscopy;;   BIOPSY  05/12/2021   Procedure: BIOPSY;  Surgeon: Eloise Harman, DO;  [facility];  Service: Endoscopy;;   ESOPHAGOGASTRODUODENOSCOPY (EGD) WITH PROPOFOL N/A 10/14/2020    Surgeon: Eloise Harman, DO;  one non-bleeding, non-obstructing gastric ulcer with visible vessel s/p bipolar cautery, normal examined esophagus and duodenum.    ESOPHAGOGASTRODUODENOSCOPY (EGD) WITH PROPOFOL N/A 12/03/2020    Surgeon: Eloise Harman, DO;   nonobstructing, nonbleeding gastric ulcer with clean base s/p biopsied, gastritis biopsied, normal examined duodenum.  Pathology with granulation tissue consistent with ulcer, mild chronic gastritis with focal intestinal metaplasia, H. pylori negative.  Repeat in 3 months.   ESOPHAGOGASTRODUODENOSCOPY (EGD) WITH PROPOFOL N/A 05/12/2021   Procedure: ESOPHAGOGASTRODUODENOSCOPY (EGD) WITH PROPOFOL;  Surgeon: Eloise Harman, DO;  [facility];  Service: Endoscopy;  Laterality: N/A;  10:30am   ESOPHAGOGASTRODUODENOSCOPY (EGD) WITH PROPOFOL N/A 06/19/2021   Procedure: ESOPHAGOGASTRODUODENOSCOPY (EGD) WITH PROPOFOL;  Surgeon: [name], MD;  Location: [facility];  Service: Endoscopy;  Laterality: N/A;   EUS N/A 06/19/2021   Procedure: UPPER ENDOSCOPIC ULTRASOUND (EUS) RADIAL;  Surgeon: [name], MD;  Location: [facility];  Service: Endoscopy;  Laterality: N/A;   PORTACATH PLACEMENT Left 06/23/2021   Procedure: INSERTION PORT-A-CATH;  Surgeon:  Aviva Signs, MD;  Location: [facility];  Service: General;  Laterality: Left;    SOCIAL HISTORY:  Social History   Socioeconomic History   Marital status: Married    Spouse name: Not on file   Number of children: Not on file   Years of education: Not on file   Highest education level: Not on file  Occupational History   Not on file  Tobacco Use   Smoking status: Never   Smokeless tobacco: Never  Vaping Use   Vaping Use: Never used  Substance and Sexual Activity   Alcohol use: Not Currently    Alcohol/week: 57.0 - 58.0 standard drinks    Types: 54 Cans of beer, 3 - 4 Shots of liquor per week    Comment: Quit January 2022. Used to drink sixpack daily and 12-15 beers on Saturdays and Sundays.    Drug use: Never   Sexual activity: Yes  Other Topics Concern   Not on file  Social History Narrative   Not on file   Social Determinants of Health   Financial Resource Strain: High Risk   Difficulty of Paying Living Expenses: Very hard  Food Insecurity: No Food Insecurity   Worried About Running Out of Food in the Last Year: Never true   Ran Out of Food in the Last Year: Never true  Transportation Needs: No Transportation Needs   Lack of Transportation (Medical): No   Lack of Transportation (Non-Medical): No  Physical Activity: Sufficiently Active   Days of Exercise per Week: 5 days   Minutes of Exercise per Session: 60 min  Stress: No Stress Concern Present   Feeling of Stress : Not at all  Social Connections: Moderately Isolated   Frequency of Communication with Friends and Family: More than three times a week   Frequency of Social Gatherings with Friends and Family: More than three times a week   Attends Religious Services: Never   Marine scientist or Organizations: No   Attends Music therapist: Never   Marital Status: Married  Human resources officer Violence: Not At Risk   Fear of Current or Ex-Partner: No   Emotionally Abused: No   Physically Abused: No    Sexually Abused: No    FAMILY HISTORY:  Family History  Problem Relation Age of Onset   Diabetes Mellitus II Mother    Hypertension Mother    Colon cancer Neg Hx    Colon polyps Neg Hx     CURRENT MEDICATIONS:  Current Outpatient Medications  Medication Sig Dispense Refill   DOCETAXEL IV Inject into the vein every 14 (fourteen) days.     fluorouracil CALGB 51025 2,400 mg/m2 in sodium chloride 0.9 % 150 mL Inject 2,600 mg/m2 into the vein over 24 hr.     FLUOROURACIL IV Inject into the vein every 14 (fourteen) days.     lidocaine-prilocaine (EMLA) cream Apply a small amount to port a cath site and cover with plastic wrap 1 hour prior to chemotherapy appointments 30 g 3   Multiple Vitamin (MULTIVITAMIN WITH MINERALS) TABS tablet Take 1 tablet by mouth in the morning.     OXALIPLATIN IV Inject into the vein every 14 (fourteen) days.  pantoprazole (PROTONIX) 40 MG tablet Take 1 tablet (40 mg total) by mouth daily. 1 tablet 30 tablet 6   sucralfate (CARAFATE) 1 GM/10ML suspension Take 10 mLs (1 g total) by mouth 4 (four) times daily -  with meals and at bedtime. 420 mL 0   HYDROcodone-acetaminophen (NORCO) 5-325 MG tablet Take 1 tablet by mouth every 4 (four) hours as needed for moderate pain. (Patient not taking: Reported on 09/18/2021) 20 tablet 0   ondansetron (ZOFRAN ODT) 4 MG disintegrating tablet Take 1 tablet (4 mg total) by mouth every 8 (eight) hours as needed for nausea or vomiting. (Patient not taking: Reported on 09/18/2021) 60 tablet 6   prochlorperazine (COMPAZINE) 10 MG tablet Take 1 tablet (10 mg total) by mouth every 6 (six) hours as needed (Nausea or vomiting). (Patient not taking: Reported on 09/18/2021) 30 tablet 6   No current facility-administered medications for this visit.    ALLERGIES:  Allergies  Allergen Reactions   Oxaliplatin Itching    PHYSICAL EXAM:  Performance status (ECOG): 0 - Asymptomatic  Vitals:   09/18/21 0904  BP: 137/89  Pulse: 78   Resp: 18  Temp: 98.7 F (37.1 C)  SpO2: 100%   Wt Readings from Last 3 Encounters:  09/18/21 154 lb 9.6 oz (70.1 kg)  09/04/21 155 lb 3.2 oz (70.4 kg)  08/21/21 158 lb (71.7 kg)   Physical Exam Vitals reviewed.  Constitutional:      Appearance: Normal appearance.  Cardiovascular:     Rate and Rhythm: Normal rate and regular rhythm.     Pulses: Normal pulses.     Heart sounds: Normal heart sounds.  Pulmonary:     Effort: Pulmonary effort is normal.     Breath sounds: Normal breath sounds.  Neurological:     General: No focal deficit present.     Mental Status: He is alert and oriented to person, place, and time.  Psychiatric:        Mood and Affect: Mood normal.        Behavior: Behavior normal.    LABORATORY DATA:  I have reviewed the labs as listed.  CBC Latest Ref Rng & Units 09/17/2021 09/04/2021 08/21/2021  WBC 4.0 - 10.5 K/uL 5.7 7.6 5.8  Hemoglobin 13.0 - 17.0 g/dL 12.7(L) 12.9(L) 11.6(L)  Hematocrit 39.0 - 52.0 % 39.5 40.5 36.9(L)  Platelets 150 - 400 K/uL 202 190 175   CMP Latest Ref Rng & Units 09/17/2021 09/04/2021 08/21/2021  Glucose 70 - 99 mg/dL 142(H) 128(H) 142(H)  BUN 6 - 20 mg/dL 13 11 10   Creatinine 0.61 - 1.24 mg/dL 0.73 0.92 0.81  Sodium 135 - 145 mmol/L 138 137 139  Potassium 3.5 - 5.1 mmol/L 3.8 3.8 3.9  Chloride 98 - 111 mmol/L 106 103 106  CO2 22 - 32 mmol/L 26 26 28   Calcium 8.9 - 10.3 mg/dL 8.7(L) 8.7(L) 8.8(L)  Total Protein 6.5 - 8.1 g/dL 6.6 6.9 6.5  Total Bilirubin 0.3 - 1.2 mg/dL 0.6 0.5 0.2(L)  Alkaline Phos 38 - 126 U/L 95 101 99  AST 15 - 41 U/L 53(H) 56(H) 34  ALT 0 - 44 U/L 86(H) 104(H) 46(H)    DIAGNOSTIC IMAGING:  I have independently reviewed the scans and discussed with the patient. CT Abdomen Pelvis W Contrast  Result Date: 09/11/2021 CLINICAL DATA:  Follow-up gastric cancer EXAM: CT ABDOMEN AND PELVIS WITH CONTRAST TECHNIQUE: Multidetector CT imaging of the abdomen and pelvis was performed using the standard protocol  following bolus  administration of intravenous contrast. CONTRAST:  30mL OMNIPAQUE IOHEXOL 300 MG/ML  SOLN COMPARISON:  PET-CT dated 05/29/2021. CT chest abdomen pelvis dated 05/14/2021. FINDINGS: Lower chest: Lung bases are clear. Hepatobiliary: Liver is within normal limits. Gallbladder is unremarkable. No intrahepatic or extrahepatic ductal dilatation. Pancreas: Within normal limits. Spleen: Within normal limits. Adrenals/Urinary Tract: Adrenal glands are within normal limits. Kidneys are within normal limits.  No hydronephrosis. Bladder is underdistended but unremarkable. Stomach/Bowel: Stomach is notable for complete/near complete resolution of the prior gastric antral mass. Mild residual wall thickening is possible superiorly along the gastric antrum (series 5/image 54). Mild debris is present (series 2/image 27), likely related to recent food ingestion. No evidence of bowel obstruction. Normal appendix (series 2/image 86). No colonic wall thickening or mass is seen. Vascular/Lymphatic: No evidence of abdominal aortic aneurysm. No suspicious abdominopelvic lymphadenopathy. Reproductive: Prostate is unremarkable. Other: No abdominopelvic ascites. Musculoskeletal: Visualized osseous structures are within normal limits. IMPRESSION: Complete/near complete resolution the prior gastric antral mass, with possible mild residual wall thickening, equivocal. No findings suspicious for metastatic disease. Electronically Signed   By: Julian Hy M.D.   On: 09/11/2021 11:33     ASSESSMENT:  1.  Gastric antral adenocarcinoma: - EGD on 05/12/2021 gastric antral mass with large cratered ulcer.  Normal duodenal bulb and first part of the duodenum. - Biopsy of the antrum ulcer consistent with adenocarcinoma, at least intramucosal.  Depth of invasion cannot be judged as the biopsy was superficial. - CT CAP on 05/14/2021 with gastric antral soft tissue thickening/mass measuring 3.7 x 3 cm.  1.2 cm portacaval lymph node.   Node within the perigastric fat anterior to the pylorus measures 5 mm.  No evidence of distant metastatic disease in the abdomen or pelvis.  Isolated 4 mm right middle lobe lung nodule most likely incidental/benign. - 13 pound weight loss in the last 3 months. - PET scan on 05/29/2021 showed mass in the gastric antrum FDG avid with SUV 5.32.  No definite evidence of FDG avid nodal metastasis or distant metastatic disease.  There is only low-level FDG uptake associated with previously described prominent upper abdominal lymph nodes. - EGD/EUS on 06/19/2021-uT3N1 4 cm cratered noncircumferential distal gastric adenocarcinoma.  There is one suspicious 9 mm perigastric lymph node nearby the primary gastric mass.  2.  Social/family history: - He is married and seen with his wife today. - He does Architect work. - He quit smoking cigarettes 13 years ago.  Quit drinking alcohol in March 2022. - No family history of malignancies.   PLAN:  1.  Stage III (uT3 N1) gastric adenocarcinoma: - He has completed 6 cycles of chemotherapy. - We have reviewed CT's [facility] from 09/11/2021 which showed complete/near complete resolution of the prior gastric antral mass with possible mild residual wall thickening, equivocal.  No suspicious findings for metastatic disease. - CEA is 2.3.  LFTs show slightly elevated AST and ALT which are stable to mildly improved since last visit.  Bilirubin is normal. - He will proceed with the next cycle of chemotherapy with a dose reduction of oxaliplatin for neuropathy. - We will reach out to Dr. Arnoldo Morale to arrange for surgery. - She will come back in 2 weeks for follow-up for last cycle of chemotherapy.  2.  Weight loss: - His weight has been stable.  He is eating well.  3.  Abdominal pain: - Occasional epigastric pain, not requiring any pain medication.  4.  Peripheral neuropathy: - He reported constant numbness in the  first 2 fingertips in both hands which is new since last  cycle. - We will cut back on oxaliplatin dose to 65 mg per metered square.  5.  Normocytic anemia: - He has received Feraheme in November.  Hemoglobin is staying stable between 12 and 13.   Orders placed this encounter:  No orders of the defined types were placed in this encounter.    Derek Jack, MD [facility] [phone]   I, Thana Ates, am acting as a scribe for Dr. Derek Jack.  I, Derek Jack MD, have reviewed the above documentation for accuracy and completeness, and I agree with the above.

## 2021-09-18 ENCOUNTER — Inpatient Hospital Stay (HOSPITAL_COMMUNITY): Payer: Self-pay

## 2021-09-18 ENCOUNTER — Inpatient Hospital Stay (HOSPITAL_BASED_OUTPATIENT_CLINIC_OR_DEPARTMENT_OTHER): Payer: Self-pay | Admitting: Hematology

## 2021-09-18 ENCOUNTER — Inpatient Hospital Stay (HOSPITAL_COMMUNITY): Payer: Self-pay | Admitting: Dietician

## 2021-09-18 VITALS — BP 113/78 | HR 70 | Temp 98.5°F | Resp 18

## 2021-09-18 VITALS — BP 137/89 | HR 78 | Temp 98.7°F | Resp 18 | Ht 67.0 in | Wt 154.6 lb

## 2021-09-18 DIAGNOSIS — Z95828 Presence of other vascular implants and grafts: Secondary | ICD-10-CM

## 2021-09-18 DIAGNOSIS — Z5111 Encounter for antineoplastic chemotherapy: Secondary | ICD-10-CM | POA: Diagnosis not present

## 2021-09-18 DIAGNOSIS — C169 Malignant neoplasm of stomach, unspecified: Secondary | ICD-10-CM

## 2021-09-18 DIAGNOSIS — C163 Malignant neoplasm of pyloric antrum: Secondary | ICD-10-CM

## 2021-09-18 LAB — CEA: CEA: 2.3 ng/mL (ref 0.0–4.7)

## 2021-09-18 MED ORDER — FAMOTIDINE 20 MG IN NS 100 ML IVPB: 20.0000 mg | Freq: Once | INTRAVENOUS | Status: DC

## 2021-09-18 MED ORDER — FAMOTIDINE IN NACL 20-0.9 MG/50ML-% IV SOLN
20.0000 mg | Freq: Once | INTRAVENOUS | Status: AC
  Administered 2021-09-18: 11:00:00 20 mg via INTRAVENOUS
  Filled 2021-09-18: qty 50

## 2021-09-18 MED ORDER — METHYLPREDNISOLONE SODIUM SUCC 125 MG IJ SOLR
125.0000 mg | Freq: Once | INTRAMUSCULAR | Status: AC
  Administered 2021-09-18: 10:00:00 125 mg via INTRAVENOUS
  Filled 2021-09-18: qty 2

## 2021-09-18 MED ORDER — PALONOSETRON HCL INJECTION 0.25 MG/5ML
0.2500 mg | Freq: Once | INTRAVENOUS | Status: AC
  Administered 2021-09-18: 10:00:00 0.25 mg via INTRAVENOUS
  Filled 2021-09-18: qty 5

## 2021-09-18 MED ORDER — HEPARIN SOD (PORK) LOCK FLUSH 100 UNIT/ML IV SOLN: 500.0000 [IU] | Freq: Once | INTRAVENOUS | Status: DC | PRN

## 2021-09-18 MED ORDER — DIPHENHYDRAMINE HCL 50 MG/ML IJ SOLN
50.0000 mg | Freq: Once | INTRAMUSCULAR | Status: AC
  Administered 2021-09-18: 10:00:00 50 mg via INTRAVENOUS
  Filled 2021-09-18: qty 1

## 2021-09-18 MED ORDER — SODIUM CHLORIDE 0.9 % IV SOLN
50.0000 mg/m2 | Freq: Once | INTRAVENOUS | Status: AC
  Administered 2021-09-18: 11:00:00 90 mg via INTRAVENOUS
  Filled 2021-09-18: qty 9

## 2021-09-18 MED ORDER — OXALIPLATIN CHEMO INJECTION 100 MG/20ML
65.0000 mg/m2 | Freq: Once | INTRAVENOUS | Status: AC
  Administered 2021-09-18: 13:00:00 120 mg via INTRAVENOUS
  Filled 2021-09-18: qty 20

## 2021-09-18 MED ORDER — DEXTROSE 5 % IV SOLN: Freq: Once | INTRAVENOUS | Status: AC

## 2021-09-18 MED ORDER — DEXTROSE 5 % IV SOLN: Freq: Once | INTRAVENOUS | Status: DC

## 2021-09-18 MED ORDER — SODIUM CHLORIDE 0.9% FLUSH: 10.0000 mL | INTRAVENOUS | Status: DC | PRN

## 2021-09-18 MED ORDER — SODIUM CHLORIDE 0.9 % IV SOLN
10.0000 mg | Freq: Once | INTRAVENOUS | Status: AC
  Administered 2021-09-18: 10:00:00 10 mg via INTRAVENOUS
  Filled 2021-09-18: qty 10

## 2021-09-18 MED ORDER — SODIUM CHLORIDE 0.9 % IV SOLN: Freq: Once | INTRAVENOUS | Status: AC

## 2021-09-18 MED ORDER — SODIUM CHLORIDE 0.9 % IV SOLN
150.0000 mg | Freq: Once | INTRAVENOUS | Status: AC
  Administered 2021-09-18: 10:00:00 150 mg via INTRAVENOUS
  Filled 2021-09-18: qty 150

## 2021-09-18 MED ORDER — SODIUM CHLORIDE 0.9 % IV SOLN
5000.0000 mg | INTRAVENOUS | Status: DC
  Administered 2021-09-18: 15:00:00 5000 mg via INTRAVENOUS
  Filled 2021-09-18: qty 100

## 2021-09-18 MED ORDER — LEUCOVORIN CALCIUM INJECTION 350 MG
200.0000 mg/m2 | Freq: Once | INTRAVENOUS | Status: AC
  Administered 2021-09-18: 13:00:00 366 mg via INTRAVENOUS
  Filled 2021-09-18: qty 18.3

## 2021-09-18 NOTE — Progress Notes (Signed)
Patient presents today for FLOT with 5FU pump start per providers orders.  Vital signs within parameters.  ALT 86, MD notified.  Patient has no new complaints at this time.  FLOT infusion with 5FU pump start given today per MD orders.  Stable during infusion without adverse affects.  Vital signs stable.  No complaints at this time.  Discharge from clinic ambulatory in stable condition.  Alert and oriented X 3.  Follow up with [facility] as scheduled.  Discharge from clinic ambulatory in stable condition.  Alert and oriented X 3.  Follow up with [facility] as scheduled.

## 2021-09-18 NOTE — Progress Notes (Signed)
Nutrition Follow-up:  Patient receiving neoadjuvant chemotherapy with FLOT for stage III gastric cancer.   Met with patient and wife during infusion. Noted plate of food from cafeteria at bedside. Patient reports he is not hungry, appears he has had a few bites. Per wife, premedications have made him groggy. Wife reports nausea improved with compazine and zofran. Patient has not been drinking Ensure regularly.    Medications: reviewed  Labs: 12/28 glucose - 142  Anthropometrics: Weight 154 lb 9.6 oz today   12/15 - 155 lb 3.2 oz 12/1 - 158 lb 11/16 - 154 lb 6 oz 11/2 - 158 lb 14.4 oz    NUTRITION DIAGNOSIS: Unintended weight loss stable   INTERVENTION:  Continue strategies for increasing calories and protein with small frequent meals and snacks Reviewed tips for nausea - pt has handout Take nausea medication as prescribed, recommend waiting 30 minutes after taking before eating Encouraged 2 Ensure Plus/equivalent daily Provided one complimentary case of Ensure Enlive today Patient has contact information    MONITORING, EVALUATION, GOAL:  Weight trends, intake    NEXT VISIT: To be scheduled

## 2021-09-18 NOTE — Progress Notes (Signed)
Patient has been examined by Dr. Katragadda, and vital signs and labs have been reviewed. ANC, Creatinine, LFTs, hemoglobin, and platelets are within treatment parameters per M.D. - pt may proceed with treatment.    °

## 2021-09-18 NOTE — Patient Instructions (Signed)
[place] [place]  Discharge Instructions: Thank you for choosing [facility] to provide your oncology and hematology care.  If you have a lab appointment with the [facility], please come in thru the Main Entrance and check in at the main information desk.  Wear comfortable clothing and clothing appropriate for easy access to any Portacath or PICC line.   We strive to give you quality time with your provider. You may need to reschedule your appointment if you arrive late (15 or more minutes).  Arriving late affects you and other patients whose appointments are after yours.  Also, if you miss three or more appointments without notifying the office, you may be dismissed from the clinic at the providers discretion.      For prescription refill requests, have your pharmacy contact our office and allow 72 hours for refills to be completed.    Today you received the following chemotherapy and/or immunotherapy agents FLOT with pump start      To help prevent nausea and vomiting after your treatment, we encourage you to take your nausea medication as directed.  BELOW ARE SYMPTOMS THAT SHOULD BE REPORTED IMMEDIATELY: *FEVER GREATER THAN 100.4 F (38 C) OR HIGHER *CHILLS OR SWEATING *NAUSEA AND VOMITING THAT IS NOT CONTROLLED WITH YOUR NAUSEA MEDICATION *UNUSUAL SHORTNESS OF BREATH *UNUSUAL BRUISING OR BLEEDING *URINARY PROBLEMS (pain or burning when urinating, or frequent urination) *BOWEL PROBLEMS (unusual diarrhea, constipation, pain near the anus) TENDERNESS IN MOUTH AND THROAT WITH OR WITHOUT PRESENCE OF ULCERS (sore throat, sores in mouth, or a toothache) UNUSUAL RASH, SWELLING OR PAIN  UNUSUAL VAGINAL DISCHARGE OR ITCHING   Items with * indicate a potential emergency and should be followed up as soon as possible or go to the Emergency Department if any problems should occur.  Please show the CHEMOTHERAPY ALERT CARD or IMMUNOTHERAPY ALERT CARD at check-in to the  Emergency Department and triage nurse.  Should you have questions after your visit or need to cancel or reschedule your appointment, please contact [facility] [phone]  and follow the prompts.  Office hours are 8:00 a.m. to 4:30 p.m. Monday - Friday. Please note that voicemails left after 4:00 p.m. may not be returned until the following business day.  We are closed weekends and major holidays. You have access to a nurse at all times for urgent questions. Please call the main number to the clinic [phone] and follow the prompts.  For any non-urgent questions, you may also contact your provider using MyChart. We now offer e-Visits for anyone 76 and older to request care online for non-urgent symptoms. For details visit [email or web address].   Also download the MyChart app! Go to the app store, search "MyChart", open the app, select [facility], and log in with your MyChart username and password.  Due to Covid, a mask is required upon entering the hospital/clinic. If you do not have a mask, one will be given to you upon arrival. For doctor visits, patients may have 1 support person aged 7 or older with them. For treatment visits, patients cannot have anyone with them due to current Covid guidelines and our immunocompromised population.

## 2021-09-18 NOTE — Progress Notes (Signed)
Additional premedication added due to last infusion issues.  Received order to decrease oxaliplatin to 65 mg/m2 due to neuropathy.  T.O. Dr Rhys Martini, PharmD

## 2021-09-18 NOTE — Patient Instructions (Signed)
[place] at [facility] Discharge Instructions   You were seen and examined today by Dr. Delton Coombes.  He reviewed your lab results which are normal/stable.  We will proceed with cycle 7 of treatment today - we added premeds and extra nausea medication to be given with treatment today.   We will refer you back to Dr. Arnoldo Morale for surgery.   Return as scheduled for treatment in 2 weeks.     Thank you for choosing [facility] at [facility] to provide your oncology and hematology care.  To afford each patient quality time with our provider, please arrive at least 15 minutes before your scheduled appointment time.   If you have a lab appointment with the [facility] please come in thru the Main Entrance and check in at the main information desk.  You need to re-schedule your appointment should you arrive 10 or more minutes late.  We strive to give you quality time with our providers, and arriving late affects you and other patients whose appointments are after yours.  Also, if you no show three or more times for appointments you may be dismissed from the clinic at the providers discretion.     Again, thank you for choosing [facility].  Our hope is that these requests will decrease the amount of time that you wait before being seen by our physicians.       _____________________________________________________________  Should you have questions after your visit to [facility], please contact our office at [phone] and follow the prompts.  Our office hours are 8:00 a.m. and 4:30 p.m. Monday - Friday.  Please note that voicemails left after 4:00 p.m. may not be returned until the following business day.  We are closed weekends and major holidays.  You do have access to a nurse 24-7, just call the main number to the clinic [phone] and do not press any options, hold on the line and a nurse will answer the phone.     For prescription refill requests, have your pharmacy contact our office and allow 72 hours.    Due to Covid, you will need to wear a mask upon entering the hospital. If you do not have a mask, a mask will be given to you at the Main Entrance upon arrival. For doctor visits, patients may have 1 support person age 61 or older with them. For treatment visits, patients can not have anyone with them due to social distancing guidelines and our immunocompromised population.

## 2021-09-19 ENCOUNTER — Encounter (HOSPITAL_COMMUNITY): Payer: Self-pay

## 2021-09-19 ENCOUNTER — Inpatient Hospital Stay (HOSPITAL_COMMUNITY): Payer: Self-pay

## 2021-09-19 ENCOUNTER — Other Ambulatory Visit: Payer: Self-pay

## 2021-09-19 VITALS — BP 123/74 | HR 68 | Temp 98.5°F | Resp 18

## 2021-09-19 DIAGNOSIS — Z5111 Encounter for antineoplastic chemotherapy: Secondary | ICD-10-CM | POA: Diagnosis not present

## 2021-09-19 DIAGNOSIS — C163 Malignant neoplasm of pyloric antrum: Secondary | ICD-10-CM

## 2021-09-19 DIAGNOSIS — Z95828 Presence of other vascular implants and grafts: Secondary | ICD-10-CM

## 2021-09-19 MED ORDER — HEPARIN SOD (PORK) LOCK FLUSH 100 UNIT/ML IV SOLN
500.0000 [IU] | Freq: Once | INTRAVENOUS | Status: AC | PRN
  Administered 2021-09-19: 15:00:00 500 [IU]

## 2021-09-19 MED ORDER — PEGFILGRASTIM-CBQV 6 MG/0.6ML ~~LOC~~ SOSY
6.0000 mg | PREFILLED_SYRINGE | Freq: Once | SUBCUTANEOUS | Status: AC
  Administered 2021-09-19: 15:00:00 6 mg via SUBCUTANEOUS
  Filled 2021-09-19: qty 0.6

## 2021-09-19 MED ORDER — SODIUM CHLORIDE 0.9% FLUSH
10.0000 mL | INTRAVENOUS | Status: DC | PRN
  Administered 2021-09-19: 15:00:00 10 mL

## 2021-09-19 NOTE — Progress Notes (Signed)
Patient for chemotherapy pump disconnect with no complaints voiced.  Patients port flushed without difficulty.  Good blood return noted with no bruising or swelling noted at site.  Band aid applied.  VSS with discharge and left ambulatory with no s/s of distress noted.    Patient tolerated injection with no complaints voiced.  Site clean and dry with no bruising or swelling noted at site.  See MAR for details.  Band aid applied.  Patient stable during and after injection.  Vss with discharge and left in satisfactory condition with no s/s of distress noted.   

## 2021-09-19 NOTE — Patient Instructions (Signed)
[facility]  Discharge Instructions: Thank you for choosing [facility] to provide your oncology and hematology care.  If you have a lab appointment with the Cancer Center, please come in thru the Main Entrance and check in at the main information desk.  Wear comfortable clothing and clothing appropriate for easy access to any Portacath or PICC line.   We strive to give you quality time with your provider. You may need to reschedule your appointment if you arrive late (15 or more minutes).  Arriving late affects you and other patients whose appointments are after yours.  Also, if you miss three or more appointments without notifying the office, you may be dismissed from the clinic at the provider's discretion.      For prescription refill requests, have your pharmacy contact our office and allow 72 hours for refills to be completed.        To help prevent nausea and vomiting after your treatment, we encourage you to take your nausea medication as directed.  BELOW ARE SYMPTOMS THAT SHOULD BE REPORTED IMMEDIATELY: *FEVER GREATER THAN 100.4 F (38 C) OR HIGHER *CHILLS OR SWEATING *NAUSEA AND VOMITING THAT IS NOT CONTROLLED WITH YOUR NAUSEA MEDICATION *UNUSUAL SHORTNESS OF BREATH *UNUSUAL BRUISING OR BLEEDING *URINARY PROBLEMS (pain or burning when urinating, or frequent urination) *BOWEL PROBLEMS (unusual diarrhea, constipation, pain near the anus) TENDERNESS IN MOUTH AND THROAT WITH OR WITHOUT PRESENCE OF ULCERS (sore throat, sores in mouth, or a toothache) UNUSUAL RASH, SWELLING OR PAIN  UNUSUAL VAGINAL DISCHARGE OR ITCHING   Items with * indicate a potential emergency and should be followed up as soon as possible or go to the Emergency Department if any problems should occur.  Please show the CHEMOTHERAPY ALERT CARD or IMMUNOTHERAPY ALERT CARD at check-in to the Emergency Department and triage nurse.  Should you have questions after your visit or need to cancel  or reschedule your appointment, please contact [facility] [phone]  and follow the prompts.  Office hours are 8:00 a.m. to 4:30 p.m. Monday - Friday. Please note that voicemails left after 4:00 p.m. may not be returned until the following business day.  We are closed weekends and major holidays. You have access to a nurse at all times for urgent questions. Please call the main number to the clinic [phone] and follow the prompts.  For any non-urgent questions, you may also contact your provider using MyChart. We now offer e-Visits for anyone 18 and older to request care online for non-urgent symptoms. For details visit [email or web address].   Also download the MyChart app! Go to the app store, search "MyChart", open the app, select [facility], and log in with your MyChart username and password.  Due to Covid, a mask is required upon entering the hospital/clinic. If you do not have a mask, one will be given to you upon arrival. For doctor visits, patients may have 1 support person aged 18 or older with them. For treatment visits, patients cannot have anyone with them due to current Covid guidelines and our immunocompromised population.  

## 2021-10-01 ENCOUNTER — Other Ambulatory Visit (HOSPITAL_COMMUNITY): Payer: 59

## 2021-10-01 ENCOUNTER — Other Ambulatory Visit: Payer: Self-pay

## 2021-10-01 ENCOUNTER — Inpatient Hospital Stay (HOSPITAL_COMMUNITY): Payer: 59 | Attending: Hematology

## 2021-10-01 DIAGNOSIS — C163 Malignant neoplasm of pyloric antrum: Secondary | ICD-10-CM

## 2021-10-01 DIAGNOSIS — Z5189 Encounter for other specified aftercare: Secondary | ICD-10-CM | POA: Diagnosis not present

## 2021-10-01 DIAGNOSIS — Z79899 Other long term (current) drug therapy: Secondary | ICD-10-CM | POA: Insufficient documentation

## 2021-10-01 DIAGNOSIS — G629 Polyneuropathy, unspecified: Secondary | ICD-10-CM | POA: Insufficient documentation

## 2021-10-01 DIAGNOSIS — Z5111 Encounter for antineoplastic chemotherapy: Secondary | ICD-10-CM | POA: Diagnosis not present

## 2021-10-01 DIAGNOSIS — D649 Anemia, unspecified: Secondary | ICD-10-CM | POA: Diagnosis not present

## 2021-10-01 DIAGNOSIS — Z95828 Presence of other vascular implants and grafts: Secondary | ICD-10-CM

## 2021-10-01 DIAGNOSIS — C169 Malignant neoplasm of stomach, unspecified: Secondary | ICD-10-CM

## 2021-10-01 DIAGNOSIS — Z87891 Personal history of nicotine dependence: Secondary | ICD-10-CM | POA: Diagnosis not present

## 2021-10-01 LAB — CBC WITH DIFFERENTIAL/PLATELET
Band Neutrophils: 8 %
Basophils Absolute: 0 10*3/uL (ref 0.0–0.1)
Basophils Relative: 0 %
Eosinophils Absolute: 0.1 10*3/uL (ref 0.0–0.5)
Eosinophils Relative: 1 %
HCT: 40.9 % (ref 39.0–52.0)
Hemoglobin: 13.2 g/dL (ref 13.0–17.0)
Lymphocytes Relative: 28 %
Lymphs Abs: 3 10*3/uL (ref 0.7–4.0)
MCH: 26.6 pg (ref 26.0–34.0)
MCHC: 32.3 g/dL (ref 30.0–36.0)
MCV: 82.5 fL (ref 80.0–100.0)
Metamyelocytes Relative: 3 %
Monocytes Absolute: 0.7 10*3/uL (ref 0.1–1.0)
Monocytes Relative: 7 %
Neutro Abs: 6.5 10*3/uL (ref 1.7–7.7)
Neutrophils Relative %: 53 %
Platelets: 221 10*3/uL (ref 150–400)
RBC: 4.96 MIL/uL (ref 4.22–5.81)
RDW: 23.2 % — ABNORMAL HIGH (ref 11.5–15.5)
WBC: 10.6 10*3/uL — ABNORMAL HIGH (ref 4.0–10.5)
nRBC: 0.3 % — ABNORMAL HIGH (ref 0.0–0.2)
nRBC: 1 /100 WBC — ABNORMAL HIGH

## 2021-10-01 LAB — COMPREHENSIVE METABOLIC PANEL
ALT: 52 U/L — ABNORMAL HIGH (ref 0–44)
AST: 40 U/L (ref 15–41)
Albumin: 3.8 g/dL (ref 3.5–5.0)
Alkaline Phosphatase: 112 U/L (ref 38–126)
Anion gap: 6 (ref 5–15)
BUN: 17 mg/dL (ref 6–20)
CO2: 24 mmol/L (ref 22–32)
Calcium: 8.6 mg/dL — ABNORMAL LOW (ref 8.9–10.3)
Chloride: 108 mmol/L (ref 98–111)
Creatinine, Ser: 1.14 mg/dL (ref 0.61–1.24)
GFR, Estimated: >60 mL/min (ref >60)
Glucose, Bld: 127 mg/dL — ABNORMAL HIGH (ref 70–99)
Potassium: 4.2 mmol/L (ref 3.5–5.1)
Sodium: 138 mmol/L (ref 135–145)
Total Bilirubin: 0.4 mg/dL (ref 0.3–1.2)
Total Protein: 6.7 g/dL (ref 6.5–8.1)

## 2021-10-01 LAB — MAGNESIUM: Magnesium: 2.4 mg/dL (ref 1.7–2.4)

## 2021-10-02 ENCOUNTER — Inpatient Hospital Stay (HOSPITAL_COMMUNITY): Payer: 59

## 2021-10-02 ENCOUNTER — Inpatient Hospital Stay (HOSPITAL_BASED_OUTPATIENT_CLINIC_OR_DEPARTMENT_OTHER): Payer: 59 | Admitting: Hematology

## 2021-10-02 VITALS — BP 106/74 | HR 73 | Temp 98.3°F | Resp 18

## 2021-10-02 VITALS — BP 139/90 | HR 74 | Temp 98.0°F | Resp 18 | Ht 67.0 in | Wt 151.8 lb

## 2021-10-02 DIAGNOSIS — Z95828 Presence of other vascular implants and grafts: Secondary | ICD-10-CM

## 2021-10-02 DIAGNOSIS — C163 Malignant neoplasm of pyloric antrum: Secondary | ICD-10-CM

## 2021-10-02 DIAGNOSIS — Z5111 Encounter for antineoplastic chemotherapy: Secondary | ICD-10-CM | POA: Diagnosis not present

## 2021-10-02 DIAGNOSIS — C169 Malignant neoplasm of stomach, unspecified: Secondary | ICD-10-CM

## 2021-10-02 MED ORDER — METHYLPREDNISOLONE SODIUM SUCC 125 MG IJ SOLR
125.0000 mg | Freq: Once | INTRAMUSCULAR | Status: AC
  Administered 2021-10-02: 125 mg via INTRAVENOUS
  Filled 2021-10-02: qty 2

## 2021-10-02 MED ORDER — DEXTROSE 5 % IV SOLN: Freq: Once | INTRAVENOUS | Status: AC

## 2021-10-02 MED ORDER — DIPHENHYDRAMINE HCL 50 MG/ML IJ SOLN
50.0000 mg | Freq: Once | INTRAMUSCULAR | Status: AC
  Administered 2021-10-02: 50 mg via INTRAVENOUS
  Filled 2021-10-02: qty 1

## 2021-10-02 MED ORDER — SODIUM CHLORIDE 0.9 % IV SOLN
10.0000 mg | Freq: Once | INTRAVENOUS | Status: AC
  Administered 2021-10-02: 10 mg via INTRAVENOUS
  Filled 2021-10-02: qty 10

## 2021-10-02 MED ORDER — LEUCOVORIN CALCIUM INJECTION 350 MG
200.0000 mg/m2 | Freq: Once | INTRAVENOUS | Status: AC
  Administered 2021-10-02: 366 mg via INTRAVENOUS
  Filled 2021-10-02: qty 18.3

## 2021-10-02 MED ORDER — SODIUM CHLORIDE 0.9 % IV SOLN: INTRAVENOUS | Status: DC

## 2021-10-02 MED ORDER — SODIUM CHLORIDE 0.9 % IV SOLN
5000.0000 mg | INTRAVENOUS | Status: DC
  Administered 2021-10-02: 5000 mg via INTRAVENOUS
  Filled 2021-10-02: qty 100

## 2021-10-02 MED ORDER — PALONOSETRON HCL INJECTION 0.25 MG/5ML
0.2500 mg | Freq: Once | INTRAVENOUS | Status: AC
  Administered 2021-10-02: 0.25 mg via INTRAVENOUS
  Filled 2021-10-02: qty 5

## 2021-10-02 MED ORDER — FAMOTIDINE IN NACL 20-0.9 MG/50ML-% IV SOLN
20.0000 mg | Freq: Once | INTRAVENOUS | Status: AC
  Administered 2021-10-02: 20 mg via INTRAVENOUS
  Filled 2021-10-02: qty 50

## 2021-10-02 MED ORDER — SODIUM CHLORIDE 0.9 % IV SOLN
50.0000 mg/m2 | Freq: Once | INTRAVENOUS | Status: AC
  Administered 2021-10-02: 90 mg via INTRAVENOUS
  Filled 2021-10-02: qty 9

## 2021-10-02 MED ORDER — OXALIPLATIN CHEMO INJECTION 100 MG/20ML
63.7500 mg/m2 | Freq: Once | INTRAVENOUS | Status: AC
  Administered 2021-10-02: 115 mg via INTRAVENOUS
  Filled 2021-10-02: qty 20

## 2021-10-02 MED ORDER — SODIUM CHLORIDE 0.9 % IV SOLN
150.0000 mg | Freq: Once | INTRAVENOUS | Status: AC
  Administered 2021-10-02: 150 mg via INTRAVENOUS
  Filled 2021-10-02: qty 150

## 2021-10-02 NOTE — Progress Notes (Signed)
Patient has been examined by Dr. Katragadda, and vital signs and labs have been reviewed. ANC, Creatinine, LFTs, hemoglobin, and platelets are within treatment parameters per M.D. - pt may proceed with treatment.    °

## 2021-10-02 NOTE — Progress Notes (Signed)
Pt presents today for FLOT per provider's order. Vital signs and labs WNL for treatment today. Oxaliplatin is slightly dose reduced today per Dr. Jill Poling to proceed with treatment per Dr.K.  FLOT given today per MD orders. Tolerated infusion without adverse affects. Vital signs stable. No complaints at this time. Discharged from clinic ambulatory in stable condition. Alert and oriented x 3. F/U with [facility] as scheduled. 5FU ambulatory pump infusing.

## 2021-10-02 NOTE — Progress Notes (Signed)
[place], [place] [address]   CLINIC:  Medical Oncology/Hematology  PCP:  [address] / [place] [address] [phone]   REASON FOR VISIT:  Follow-up for adenocarcinoma of the stomach  PRIOR THERAPY: none  NGS Results: not done  CURRENT THERAPY: Gastroesophageal FLOT every 2 weeks for 4 cycles  BRIEF ONCOLOGIC HISTORY:  Oncology History  Gastric cancer ([place])  06/19/2021 Initial Diagnosis   Gastric cancer ([place])   06/25/2021 -  Chemotherapy   Patient is on Treatment Plan : GASTROESOPHAGEAL FLOT q14d X 4 cycles       CANCER STAGING:  Cancer Staging  Gastric cancer ([place]) Staging form: Stomach, AJCC 8th Edition - Clinical stage from 06/19/2021: Stage III (cT3, cN1, cM0) - Unsigned   INTERVAL HISTORY:  [name], a 31 y.o. male, returns for routine follow-up and consideration for next cycle of chemotherapy. Steven Martinez was last seen on 09/18/2021.  Due for cycle #8 of FLOT today.   Overall, he tells me he has been feeling pretty well, and he is accompanied by his wife who is acting as interpreter. He reports stomach and left side abdominal pain after eating. He reports feeling full for a long time after eating beginning since last treatment. He has been eating less as a result. He has lost 3 lbs in the last 2 weeks. He reports numbness in three fingers; it was not effected hi finer motor skills at this time. He denies n/v/d.  Overall, he feels ready for next cycle of chemo today.    REVIEW OF SYSTEMS:  Review of Systems  Constitutional:  Positive for unexpected weight change (-3 lbs). Negative for appetite change and fatigue.  Gastrointestinal:  Positive for abdominal pain (3/10 stomach and L side). Negative for blood in stool, diarrhea, nausea and vomiting.  Neurological:  Positive for numbness.  All other systems reviewed and are negative.  PAST MEDICAL/SURGICAL HISTORY:  Past  Medical History:  Diagnosis Date   Acute upper GI bleed 10/13/2020   gastric ulcer with visible vessel   Alcohol abuse 10/13/2020   Quit Jan 2022   GERD (gastroesophageal reflux disease)    Port-A-Cath in place 06/23/2021   Positive H. pylori test 09/2020   H pylori IgG + Jan 2022 s/p treatment with Prevpac. EGD March 2022 with gastric biopsy negative for H. pylori.    Past Surgical History:  Procedure Laterality Date   BIOPSY  12/03/2020   Procedure: BIOPSY;  Surgeon: Eloise Harman, DO;  [facility];  Service: Endoscopy;;   BIOPSY  05/12/2021   Procedure: BIOPSY;  Surgeon: Eloise Harman, DO;  [facility];  Service: Endoscopy;;   ESOPHAGOGASTRODUODENOSCOPY (EGD) WITH PROPOFOL N/A 10/14/2020    Surgeon: Eloise Harman, DO;  one non-bleeding, non-obstructing gastric ulcer with visible vessel s/p bipolar cautery, normal examined esophagus and duodenum.    ESOPHAGOGASTRODUODENOSCOPY (EGD) WITH PROPOFOL N/A 12/03/2020    Surgeon: Eloise Harman, DO;   nonobstructing, nonbleeding gastric ulcer with clean base s/p biopsied, gastritis biopsied, normal examined duodenum.  Pathology with granulation tissue consistent with ulcer, mild chronic gastritis with focal intestinal metaplasia, H. pylori negative.  Repeat in 3 months.   ESOPHAGOGASTRODUODENOSCOPY (EGD) WITH PROPOFOL N/A 05/12/2021   Procedure: ESOPHAGOGASTRODUODENOSCOPY (EGD) WITH PROPOFOL;  Surgeon: Eloise Harman, DO;  [facility];  Service: Endoscopy;  Laterality: N/A;  10:30am   ESOPHAGOGASTRODUODENOSCOPY (EGD) WITH PROPOFOL N/A  06/19/2021   Procedure: ESOPHAGOGASTRODUODENOSCOPY (EGD) WITH PROPOFOL;  Surgeon: [name], MD;  [facility];  Service: Endoscopy;  Laterality: N/A;   EUS N/A 06/19/2021   Procedure: UPPER ENDOSCOPIC ULTRASOUND (EUS) RADIAL;  Surgeon: [name], MD;  Location: [facility];  Service: Endoscopy;  Laterality: N/A;   PORTACATH PLACEMENT Left  06/23/2021   Procedure: INSERTION PORT-A-CATH;  Surgeon: Aviva Signs, MD;  Location: [facility];  Service: General;  Laterality: Left;    SOCIAL HISTORY:  Social History   Socioeconomic History   Marital status: Married    Spouse name: Not on file   Number of children: Not on file   Years of education: Not on file   Highest education level: Not on file  Occupational History   Not on file  Tobacco Use   Smoking status: Never   Smokeless tobacco: Never  Vaping Use   Vaping Use: Never used  Substance and Sexual Activity   Alcohol use: Not Currently    Alcohol/week: 57.0 - 58.0 standard drinks    Types: 54 Cans of beer, 3 - 4 Shots of liquor per week    Comment: Quit January 2022. Used to drink sixpack daily and 12-15 beers on Saturdays and Sundays.    Drug use: Never   Sexual activity: Yes  Other Topics Concern   Not on file  Social History Narrative   Not on file   Social Determinants of Health   Financial Resource Strain: High Risk   Difficulty of Paying Living Expenses: Very hard  Food Insecurity: No Food Insecurity   Worried About Running Out of Food in the Last Year: Never true   Ran Out of Food in the Last Year: Never true  Transportation Needs: No Transportation Needs   Lack of Transportation (Medical): No   Lack of Transportation (Non-Medical): No  Physical Activity: Sufficiently Active   Days of Exercise per Week: 5 days   Minutes of Exercise per Session: 60 min  Stress: No Stress Concern Present   Feeling of Stress : Not at all  Social Connections: Moderately Isolated   Frequency of Communication with Friends and Family: More than three times a week   Frequency of Social Gatherings with Friends and Family: More than three times a week   Attends Religious Services: Never   Marine scientist or Organizations: No   Attends Music therapist: Never   Marital Status: Married  Human resources officer Violence: Not At Risk   Fear of Current or Ex-Partner:  No   Emotionally Abused: No   Physically Abused: No   Sexually Abused: No    FAMILY HISTORY:  Family History  Problem Relation Age of Onset   Diabetes Mellitus II Mother    Hypertension Mother    Colon cancer Neg Hx    Colon polyps Neg Hx     CURRENT MEDICATIONS:  Current Outpatient Medications  Medication Sig Dispense Refill   DOCETAXEL IV Inject into the vein every 14 (fourteen) days.     fluorouracil CALGB 03474 2,400 mg/m2 in sodium chloride 0.9 % 150 mL Inject 2,600 mg/m2 into the vein over 24 hr.     FLUOROURACIL IV Inject into the vein every 14 (fourteen) days.     lidocaine-prilocaine (EMLA) cream Apply a small amount to port a cath site and cover with plastic wrap 1 hour prior to chemotherapy appointments 30 g 3   Multiple Vitamin (MULTIVITAMIN WITH MINERALS) TABS tablet Take 1 tablet by mouth in  the morning.     OXALIPLATIN IV Inject into the vein every 14 (fourteen) days.     pantoprazole (PROTONIX) 40 MG tablet Take 1 tablet (40 mg total) by mouth daily. 1 tablet 30 tablet 6   sucralfate (CARAFATE) 1 GM/10ML suspension Take 10 mLs (1 g total) by mouth 4 (four) times daily -  with meals and at bedtime. 420 mL 0   HYDROcodone-acetaminophen (NORCO) 5-325 MG tablet Take 1 tablet by mouth every 4 (four) hours as needed for moderate pain. (Patient not taking: Reported on 10/02/2021) 20 tablet 0   ondansetron (ZOFRAN ODT) 4 MG disintegrating tablet Take 1 tablet (4 mg total) by mouth every 8 (eight) hours as needed for nausea or vomiting. (Patient not taking: Reported on 10/02/2021) 60 tablet 6   prochlorperazine (COMPAZINE) 10 MG tablet Take 1 tablet (10 mg total) by mouth every 6 (six) hours as needed (Nausea or vomiting). (Patient not taking: Reported on 10/02/2021) 30 tablet 6   No current facility-administered medications for this visit.    ALLERGIES:  Allergies  Allergen Reactions   Oxaliplatin Itching    PHYSICAL EXAM:  Performance status (ECOG): 0 -  Asymptomatic  Vitals:   10/02/21 0914  BP: 139/90  Pulse: 74  Resp: 18  Temp: 98 F (36.7 C)  SpO2: 100%   Wt Readings from Last 3 Encounters:  10/02/21 151 lb 12.8 oz (68.9 kg)  09/18/21 154 lb 9.6 oz (70.1 kg)  09/04/21 155 lb 3.2 oz (70.4 kg)   Physical Exam Vitals reviewed.  Constitutional:      Appearance: Normal appearance.  Cardiovascular:     Rate and Rhythm: Normal rate and regular rhythm.     Pulses: Normal pulses.     Heart sounds: Normal heart sounds.  Pulmonary:     Effort: Pulmonary effort is normal.     Breath sounds: Normal breath sounds.  Abdominal:     Palpations: Abdomen is soft. There is no hepatomegaly, splenomegaly or mass.     Tenderness: There is abdominal tenderness (L epigastric region).  Neurological:     General: No focal deficit present.     Mental Status: He is alert and oriented to person, place, and time.  Psychiatric:        Mood and Affect: Mood normal.        Behavior: Behavior normal.    LABORATORY DATA:  I have reviewed the labs as listed.  CBC Latest Ref Rng & Units 10/01/2021 09/17/2021 09/04/2021  WBC 4.0 - 10.5 K/uL 10.6(H) 5.7 7.6  Hemoglobin 13.0 - 17.0 g/dL 13.2 12.7(L) 12.9(L)  Hematocrit 39.0 - 52.0 % 40.9 39.5 40.5  Platelets 150 - 400 K/uL 221 202 190   CMP Latest Ref Rng & Units 10/01/2021 09/17/2021 09/04/2021  Glucose 70 - 99 mg/dL 127(H) 142(H) 128(H)  BUN 6 - 20 mg/dL 17 13 11   Creatinine 0.61 - 1.24 mg/dL 1.14 0.73 0.92  Sodium 135 - 145 mmol/L 138 138 137  Potassium 3.5 - 5.1 mmol/L 4.2 3.8 3.8  Chloride 98 - 111 mmol/L 108 106 103  CO2 22 - 32 mmol/L 24 26 26   Calcium 8.9 - 10.3 mg/dL 8.6(L) 8.7(L) 8.7(L)  Total Protein 6.5 - 8.1 g/dL 6.7 6.6 6.9  Total Bilirubin 0.3 - 1.2 mg/dL 0.4 0.6 0.5  Alkaline Phos 38 - 126 U/L 112 95 101  AST 15 - 41 U/L 40 53(H) 56(H)  ALT 0 - 44 U/L 52(H) 86(H) 104(H)    DIAGNOSTIC IMAGING:  I have independently reviewed the scans and discussed with the patient. CT Abdomen  Pelvis W Contrast  Result Date: 09/11/2021 CLINICAL DATA:  Follow-up gastric cancer EXAM: CT ABDOMEN AND PELVIS WITH CONTRAST TECHNIQUE: Multidetector CT imaging of the abdomen and pelvis was performed using the standard protocol following bolus administration of intravenous contrast. CONTRAST:  4mL OMNIPAQUE IOHEXOL 300 MG/ML  SOLN COMPARISON:  PET-CT dated 05/29/2021. CT chest abdomen pelvis dated 05/14/2021. FINDINGS: Lower chest: Lung bases are clear. Hepatobiliary: Liver is within normal limits. Gallbladder is unremarkable. No intrahepatic or extrahepatic ductal dilatation. Pancreas: Within normal limits. Spleen: Within normal limits. Adrenals/Urinary Tract: Adrenal glands are within normal limits. Kidneys are within normal limits.  No hydronephrosis. Bladder is underdistended but unremarkable. Stomach/Bowel: Stomach is notable for complete/near complete resolution of the prior gastric antral mass. Mild residual wall thickening is possible superiorly along the gastric antrum (series 5/image 54). Mild debris is present (series 2/image 27), likely related to recent food ingestion. No evidence of bowel obstruction. Normal appendix (series 2/image 86). No colonic wall thickening or mass is seen. Vascular/Lymphatic: No evidence of abdominal aortic aneurysm. No suspicious abdominopelvic lymphadenopathy. Reproductive: Prostate is unremarkable. Other: No abdominopelvic ascites. Musculoskeletal: Visualized osseous structures are within normal limits. IMPRESSION: Complete/near complete resolution the prior gastric antral mass, with possible mild residual wall thickening, equivocal. No findings suspicious for metastatic disease. Electronically Signed   By: Julian Hy M.D.   On: 09/11/2021 11:33     ASSESSMENT:  1.  Gastric antral adenocarcinoma: - EGD on 05/12/2021 gastric antral mass with large cratered ulcer.  Normal duodenal bulb and first part of the duodenum. - Biopsy of the antrum ulcer consistent  with adenocarcinoma, at least intramucosal.  Depth of invasion cannot be judged as the biopsy was superficial. - CT CAP on 05/14/2021 with gastric antral soft tissue thickening/mass measuring 3.7 x 3 cm.  1.2 cm portacaval lymph node.  Node within the perigastric fat anterior to the pylorus measures 5 mm.  No evidence of distant metastatic disease in the abdomen or pelvis.  Isolated 4 mm right middle lobe lung nodule most likely incidental/benign. - 13 pound weight loss in the last 3 months. - PET scan on 05/29/2021 showed mass in the gastric antrum FDG avid with SUV 5.32.  No definite evidence of FDG avid nodal metastasis or distant metastatic disease.  There is only low-level FDG uptake associated with previously described prominent upper abdominal lymph nodes. - EGD/EUS on 06/19/2021-uT3N1 4 cm cratered noncircumferential distal gastric adenocarcinoma.  There is one suspicious 9 mm perigastric lymph node nearby the primary gastric mass.  2.  Social/family history: - He is married and seen with his wife today. - He does Architect work. - He quit smoking cigarettes 13 years ago.  Quit drinking alcohol in March 2022. - No family history of malignancies.   PLAN:  1.  Stage III (uT3 N1) gastric adenocarcinoma: - CT scan after 6 cycles of chemotherapy on 09/11/2021 showed complete/near complete resolution of prior gastric antral mass with possible mild residual wall thickening.  No suspicious findings for metastatic disease. - Reviewed labs today which showed AST improved to normal.  ALT is 52.  Bilirubin is normal.  CBC was grossly normal.  Last CEA was 2.3. - He will proceed with cycle 8 of chemotherapy today. - He is seeing Dr. Arnoldo Morale on 10/08/2021 for scheduling surgery. - I will see him back 1 month after his surgery.  2.  Weight loss: - He lost about 3  pounds since last visit.  Wife reports that he has not been eating red meats and is also avoiding chicken.  He is eating mostly fruits and  vegetables.  He was told to gain some weight back prior to surgery.  3.  Abdominal pain: - He has occasional epigastric pain, also food getting stuck in the epigastric region for the last couple of weeks.  He is not requiring any pain medication.  4.  Peripheral neuropathy: - He has constant numbness in the first 3 fingertips in both hands. - We will continue to cut back oxaliplatin by 20%.  5.  Normocytic anemia: - He had Feraheme in November.  Hemoglobin is stable between 12 and 13.   Orders placed this encounter:  No orders of the defined types were placed in this encounter.    Derek Jack, MD [place] [phone]   I, Thana Ates, am acting as a scribe for Dr. Derek Jack.  I, Derek Jack MD, have reviewed the above documentation for accuracy and completeness, and I agree with the above.

## 2021-10-02 NOTE — Patient Instructions (Signed)
[facility]  Discharge Instructions: Thank you for choosing [facility] to provide your oncology and hematology care.  If you have a lab appointment with the [facility], please come in thru the Main Entrance and check in at the main information desk.  Wear comfortable clothing and clothing appropriate for easy access to any Portacath or PICC line.   We strive to give you quality time with your provider. You may need to reschedule your appointment if you arrive late (15 or more minutes).  Arriving late affects you and other patients whose appointments are after yours.  Also, if you miss three or more appointments without notifying the office, you may be dismissed from the clinic at the providers discretion.      For prescription refill requests, have your pharmacy contact our office and allow 72 hours for refills to be completed.    Today you received the following chemotherapy and/or immunotherapy agents FLOT and 5FU pump   To help prevent nausea and vomiting after your treatment, we encourage you to take your nausea medication as directed.  BELOW ARE SYMPTOMS THAT SHOULD BE REPORTED IMMEDIATELY: *FEVER GREATER THAN 100.4 F (38 C) OR HIGHER *CHILLS OR SWEATING *NAUSEA AND VOMITING THAT IS NOT CONTROLLED WITH YOUR NAUSEA MEDICATION *UNUSUAL SHORTNESS OF BREATH *UNUSUAL BRUISING OR BLEEDING *URINARY PROBLEMS (pain or burning when urinating, or frequent urination) *BOWEL PROBLEMS (unusual diarrhea, constipation, pain near the anus) TENDERNESS IN MOUTH AND THROAT WITH OR WITHOUT PRESENCE OF ULCERS (sore throat, sores in mouth, or a toothache) UNUSUAL RASH, SWELLING OR PAIN  UNUSUAL VAGINAL DISCHARGE OR ITCHING   Items with * indicate a potential emergency and should be followed up as soon as possible or go to the Emergency Department if any problems should occur.  Please show the CHEMOTHERAPY ALERT CARD or IMMUNOTHERAPY ALERT CARD at check-in to the Emergency  Department and triage nurse.  Should you have questions after your visit or need to cancel or reschedule your appointment, please contact [facility] [phone]  and follow the prompts.  Office hours are 8:00 a.m. to 4:30 p.m. Monday - Friday. Please note that voicemails left after 4:00 p.m. may not be returned until the following business day.  We are closed weekends and major holidays. You have access to a nurse at all times for urgent questions. Please call the main number to the clinic [phone] and follow the prompts.  For any non-urgent questions, you may also contact your provider using MyChart. We now offer e-Visits for anyone 72 and older to request care online for non-urgent symptoms. For details visit [email or web address].   Also download the MyChart app! Go to the app store, search "MyChart", open the app, select [facility], and log in with your MyChart username and password.  Due to Covid, a mask is required upon entering the hospital/clinic. If you do not have a mask, one will be given to you upon arrival. For doctor visits, patients may have 1 support person aged 68 or older with them. For treatment visits, patients cannot have anyone with them due to current Covid guidelines and our immunocompromised population.

## 2021-10-02 NOTE — Patient Instructions (Signed)
[place] at [facility] Discharge Instructions   You were seen and examined today by Dr. Delton Coombes.  He reviewed your labs which are normal/stable.  We will proceed with your last treatment today.  Follow up with Dr. Arnoldo Morale as scheduled.  Return as scheduled in 2 months.    Thank you for choosing [place] at [facility] to provide your oncology and hematology care.  To afford each patient quality time with our provider, please arrive at least 15 minutes before your scheduled appointment time.   If you have a lab appointment with the [facility] please come in thru the Main Entrance and check in at the main information desk.  You need to re-schedule your appointment should you arrive 10 or more minutes late.  We strive to give you quality time with our providers, and arriving late affects you and other patients whose appointments are after yours.  Also, if you no show three or more times for appointments you may be dismissed from the clinic at the providers discretion.     Again, thank you for choosing [facility].  Our hope is that these requests will decrease the amount of time that you wait before being seen by our physicians.       _____________________________________________________________  Should you have questions after your visit to [facility], please contact our office at [phone] and follow the prompts.  Our office hours are 8:00 a.m. and 4:30 p.m. Monday - Friday.  Please note that voicemails left after 4:00 p.m. may not be returned until the following business day.  We are closed weekends and major holidays.  You do have access to a nurse 24-7, just call the main number to the clinic [phone] and do not press any options, hold on the line and a nurse will answer the phone.    For prescription refill requests, have your pharmacy contact our office and allow 72 hours.    Due to Covid, you  will need to wear a mask upon entering the hospital. If you do not have a mask, a mask will be given to you at the Main Entrance upon arrival. For doctor visits, patients may have 1 support person age 62 or older with them. For treatment visits, patients can not have anyone with them due to social distancing guidelines and our immunocompromised population.

## 2021-10-03 ENCOUNTER — Inpatient Hospital Stay (HOSPITAL_COMMUNITY): Payer: 59

## 2021-10-03 ENCOUNTER — Other Ambulatory Visit: Payer: Self-pay

## 2021-10-03 VITALS — BP 100/70 | HR 77 | Temp 98.5°F | Resp 16

## 2021-10-03 DIAGNOSIS — Z95828 Presence of other vascular implants and grafts: Secondary | ICD-10-CM

## 2021-10-03 DIAGNOSIS — Z5111 Encounter for antineoplastic chemotherapy: Secondary | ICD-10-CM | POA: Diagnosis not present

## 2021-10-03 DIAGNOSIS — C163 Malignant neoplasm of pyloric antrum: Secondary | ICD-10-CM

## 2021-10-03 MED ORDER — SODIUM CHLORIDE 0.9% FLUSH
10.0000 mL | INTRAVENOUS | Status: DC | PRN
  Administered 2021-10-03: 10 mL

## 2021-10-03 MED ORDER — PEGFILGRASTIM-CBQV 6 MG/0.6ML ~~LOC~~ SOSY
6.0000 mg | PREFILLED_SYRINGE | Freq: Once | SUBCUTANEOUS | Status: AC
  Administered 2021-10-03: 6 mg via SUBCUTANEOUS
  Filled 2021-10-03: qty 0.6

## 2021-10-03 MED ORDER — HEPARIN SOD (PORK) LOCK FLUSH 100 UNIT/ML IV SOLN
500.0000 [IU] | Freq: Once | INTRAVENOUS | Status: AC | PRN
  Administered 2021-10-03: 500 [IU]

## 2021-10-03 NOTE — Patient Instructions (Signed)
[facility]  Discharge Instructions: Thank you for choosing [facility] to provide your oncology and hematology care.  If you have a lab appointment with the Cancer Center, please come in thru the Main Entrance and check in at the main information desk.  Wear comfortable clothing and clothing appropriate for easy access to any Portacath or PICC line.   We strive to give you quality time with your provider. You may need to reschedule your appointment if you arrive late (15 or more minutes).  Arriving late affects you and other patients whose appointments are after yours.  Also, if you miss three or more appointments without notifying the office, you may be dismissed from the clinic at the provider's discretion.      For prescription refill requests, have your pharmacy contact our office and allow 72 hours for refills to be completed.        To help prevent nausea and vomiting after your treatment, we encourage you to take your nausea medication as directed.  BELOW ARE SYMPTOMS THAT SHOULD BE REPORTED IMMEDIATELY: *FEVER GREATER THAN 100.4 F (38 C) OR HIGHER *CHILLS OR SWEATING *NAUSEA AND VOMITING THAT IS NOT CONTROLLED WITH YOUR NAUSEA MEDICATION *UNUSUAL SHORTNESS OF BREATH *UNUSUAL BRUISING OR BLEEDING *URINARY PROBLEMS (pain or burning when urinating, or frequent urination) *BOWEL PROBLEMS (unusual diarrhea, constipation, pain near the anus) TENDERNESS IN MOUTH AND THROAT WITH OR WITHOUT PRESENCE OF ULCERS (sore throat, sores in mouth, or a toothache) UNUSUAL RASH, SWELLING OR PAIN  UNUSUAL VAGINAL DISCHARGE OR ITCHING   Items with * indicate a potential emergency and should be followed up as soon as possible or go to the Emergency Department if any problems should occur.  Please show the CHEMOTHERAPY ALERT CARD or IMMUNOTHERAPY ALERT CARD at check-in to the Emergency Department and triage nurse.  Should you have questions after your visit or need to cancel  or reschedule your appointment, please contact [facility] [phone]  and follow the prompts.  Office hours are 8:00 a.m. to 4:30 p.m. Monday - Friday. Please note that voicemails left after 4:00 p.m. may not be returned until the following business day.  We are closed weekends and major holidays. You have access to a nurse at all times for urgent questions. Please call the main number to the clinic [phone] and follow the prompts.  For any non-urgent questions, you may also contact your provider using MyChart. We now offer e-Visits for anyone 18 and older to request care online for non-urgent symptoms. For details visit [email or web address].   Also download the MyChart app! Go to the app store, search "MyChart", open the app, select , and log in with your MyChart username and password.  Due to Covid, a mask is required upon entering the hospital/clinic. If you do not have a mask, one will be given to you upon arrival. For doctor visits, patients may have 1 support person aged 18 or older with them. For treatment visits, patients cannot have anyone with them due to current Covid guidelines and our immunocompromised population.  

## 2021-10-03 NOTE — Progress Notes (Signed)
Udenyca injection given. Pump d/c done today per orders. Patient tolerated it well without problems. Vitals stable and discharged home from clinic ambulatory. Follow up as scheduled.

## 2021-10-06 NOTE — Progress Notes (Addendum)
..  Patient is receiving Assistance Medication - Supplied Externally. Medication: Udenyca ([id number]) Manufacture: [employer] Approval Dates: Approved from 10/03/2021 until 10/03/2022. ID: [id number] Reason: Self Pay First DOS: 09/05/2021.   Marland KitchenJuan Quam, CPhT IV Drug Replacement Specialist [place] Phone: [phone]

## 2021-10-08 ENCOUNTER — Ambulatory Visit: Payer: Self-pay | Admitting: General Surgery

## 2021-10-15 ENCOUNTER — Encounter (HOSPITAL_COMMUNITY): Payer: Self-pay | Admitting: Hematology

## 2021-10-16 ENCOUNTER — Encounter (HOSPITAL_COMMUNITY): Payer: Self-pay | Admitting: Hematology

## 2021-10-16 ENCOUNTER — Encounter: Payer: Self-pay | Admitting: General Surgery

## 2021-10-16 ENCOUNTER — Other Ambulatory Visit: Payer: Self-pay

## 2021-10-16 ENCOUNTER — Ambulatory Visit (INDEPENDENT_AMBULATORY_CARE_PROVIDER_SITE_OTHER): Payer: 59 | Admitting: General Surgery

## 2021-10-16 VITALS — BP 129/88 | HR 78 | Temp 98.4°F | Resp 14 | Ht 67.0 in | Wt 150.0 lb

## 2021-10-16 DIAGNOSIS — C163 Malignant neoplasm of pyloric antrum: Secondary | ICD-10-CM | POA: Diagnosis not present

## 2021-10-16 NOTE — Progress Notes (Signed)
Steven Martinez; [id number]; August 06, 1991   HPI Patient is a 31 year old Hispanic male who returns to my care for surgical treatment of his gastric carcinoma.  He was initially diagnosed in the summer 2022.  A Port-A-Cath was placed and he underwent neoadjuvant chemotherapy.  He had a recent CT scan of the abdomen and pelvis which revealed no residual tumor present.  He has tolerated the chemotherapy well and is now referred for surgical resection. Past Medical History:  Diagnosis Date   Acute upper GI bleed 10/13/2020   gastric ulcer with visible vessel   Alcohol abuse 10/13/2020   Quit Jan 2022   GERD (gastroesophageal reflux disease)    Port-A-Cath in place 06/23/2021   Positive H. pylori test 09/2020   H pylori IgG + Jan 2022 s/p treatment with Prevpac. EGD March 2022 with gastric biopsy negative for H. pylori.     Past Surgical History:  Procedure Laterality Date   BIOPSY  12/03/2020   Procedure: BIOPSY;  Surgeon: Eloise Harman, DO;  Location: [facility];  Service: Endoscopy;;   BIOPSY  05/12/2021   Procedure: BIOPSY;  Surgeon: Eloise Harman, DO;  Location: [facility];  Service: Endoscopy;;   ESOPHAGOGASTRODUODENOSCOPY (EGD) WITH PROPOFOL N/A 10/14/2020    Surgeon: Eloise Harman, DO;  one non-bleeding, non-obstructing gastric ulcer with visible vessel s/p bipolar cautery, normal examined esophagus and duodenum.    ESOPHAGOGASTRODUODENOSCOPY (EGD) WITH PROPOFOL N/A 12/03/2020    Surgeon: Eloise Harman, DO;   nonobstructing, nonbleeding gastric ulcer with clean base s/p biopsied, gastritis biopsied, normal examined duodenum.  Pathology with granulation tissue consistent with ulcer, mild chronic gastritis with focal intestinal metaplasia, H. pylori negative.  Repeat in 3 months.   ESOPHAGOGASTRODUODENOSCOPY (EGD) WITH PROPOFOL N/A 05/12/2021   Procedure: ESOPHAGOGASTRODUODENOSCOPY (EGD) WITH PROPOFOL;  Surgeon: Eloise Harman, DO;  Location: [facility];  Service:  Endoscopy;  Laterality: N/A;  10:30am   ESOPHAGOGASTRODUODENOSCOPY (EGD) WITH PROPOFOL N/A 06/19/2021   Procedure: ESOPHAGOGASTRODUODENOSCOPY (EGD) WITH PROPOFOL;  Surgeon: [name], MD;  Location: [facility];  Service: Endoscopy;  Laterality: N/A;   EUS N/A 06/19/2021   Procedure: UPPER ENDOSCOPIC ULTRASOUND (EUS) RADIAL;  Surgeon: [name], MD;  Location: [facility];  Service: Endoscopy;  Laterality: N/A;   PORTACATH PLACEMENT Left 06/23/2021   Procedure: INSERTION PORT-A-CATH;  Surgeon: Aviva Signs, MD;  Location: [facility];  Service: General;  Laterality: Left;    Family History  Problem Relation Age of Onset   Diabetes Mellitus II Mother    Hypertension Mother    Colon cancer Neg Hx    Colon polyps Neg Hx     Current Outpatient Medications on File Prior to Visit  Medication Sig Dispense Refill   DOCETAXEL IV Inject into the vein every 14 (fourteen) days.     fluorouracil CALGB 62952 2,400 mg/m2 in sodium chloride 0.9 % 150 mL Inject 2,600 mg/m2 into the vein over 24 hr.     FLUOROURACIL IV Inject into the vein every 14 (fourteen) days.     HYDROcodone-acetaminophen (NORCO) 5-325 MG tablet Take 1 tablet by mouth every 4 (four) hours as needed for moderate pain. (Patient not taking: Reported on 10/02/2021) 20 tablet 0   lidocaine-prilocaine (EMLA) cream Apply a small amount to port a cath site and cover with plastic wrap 1 hour prior to chemotherapy appointments (Patient not taking: Reported on 10/02/2021) 30 g 3   Multiple Vitamin (MULTIVITAMIN WITH MINERALS) TABS tablet Take 1 tablet by mouth in  the morning.     ondansetron (ZOFRAN ODT) 4 MG disintegrating tablet Take 1 tablet (4 mg total) by mouth every 8 (eight) hours as needed for nausea or vomiting. 60 tablet 6   OXALIPLATIN IV Inject into the vein every 14 (fourteen) days.     pantoprazole (PROTONIX) 40 MG tablet Take 1 tablet (40 mg total) by mouth daily. 1 tablet 30 tablet 6   prochlorperazine (COMPAZINE) 10 MG  tablet Take 1 tablet (10 mg total) by mouth every 6 (six) hours as needed (Nausea or vomiting). 30 tablet 6   sucralfate (CARAFATE) 1 GM/10ML suspension Take 10 mLs (1 g total) by mouth 4 (four) times daily -  with meals and at bedtime. (Patient not taking: Reported on 10/02/2021) 420 mL 0   No current facility-administered medications on file prior to visit.    Allergies  Allergen Reactions   Oxaliplatin Itching    Social History   Substance and Sexual Activity  Alcohol Use Not Currently   Alcohol/week: 57.0 - 58.0 standard drinks   Types: 54 Cans of beer, 3 - 4 Shots of liquor per week   Comment: Quit January 2022. Used to drink sixpack daily and 12-15 beers on Saturdays and Sundays.     Social History   Tobacco Use  Smoking Status Never  Smokeless Tobacco Never    Review of Systems  Constitutional: Negative.   HENT: Negative.    Eyes: Negative.   Respiratory: Negative.    Cardiovascular: Negative.   Gastrointestinal:  Positive for abdominal pain.  Genitourinary: Negative.   Musculoskeletal: Negative.   Skin: Negative.   Neurological: Negative.   Endo/Heme/Allergies: Negative.   Psychiatric/Behavioral: Negative.     Objective   Vitals:   10/16/21 1136  BP: 129/88  Pulse: 78  Resp: 14  Temp: 98.4 F (36.9 C)  SpO2: 97%    Physical Exam Vitals reviewed.  Constitutional:      Appearance: Normal appearance. He is normal weight. He is not ill-appearing.  HENT:     Head: Normocephalic and atraumatic.  Cardiovascular:     Rate and Rhythm: Normal rate and regular rhythm.     Heart sounds: Normal heart sounds. No murmur heard.   No friction rub. No gallop.  Pulmonary:     Effort: Pulmonary effort is normal. No respiratory distress.     Breath sounds: Normal breath sounds. No stridor. No wheezing, rhonchi or rales.  Abdominal:     General: Abdomen is flat. Bowel sounds are normal. There is no distension.     Palpations: Abdomen is soft. There is no mass.      Tenderness: There is no abdominal tenderness. There is no guarding or rebound.     Hernia: No hernia is present.  Musculoskeletal:     Cervical back: Normal range of motion and neck supple.  Lymphadenopathy:     Cervical: No cervical adenopathy.  Skin:    General: Skin is warm and dry.  Neurological:     Mental Status: He is alert and oriented to person, place, and time.   Oncology notes reviewed.  CT scan report reviewed. Assessment  Gastric carcinoma, finished with neoadjuvant therapy. Plan  Patient is scheduled for a partial gastrectomy with Roux-en-Y gastrojejunostomy on 11/03/2021.  The risks and benefits of the procedure including bleeding, infection, the finding of residual cancer, and anastomotic leak were fully explained to the patient, who gave informed consent.  His wife was present during the visit.  All questions were answered.

## 2021-10-16 NOTE — Patient Instructions (Signed)
Roux-en-Y gastrojejunostomy

## 2021-10-17 NOTE — Progress Notes (Signed)
..  Patient Assist/Replace for the following has been terminated. Medication: Udenyca ([id number]) Reason for Termination: Patient has [employer] starting 09/21/2021, assistance no longer valid. Last DOS: 10/03/2021.  Marland KitchenJuan Quam, CPhT IV Drug Replacement Specialist [place] Phone: [phone]

## 2021-10-20 ENCOUNTER — Encounter (HOSPITAL_COMMUNITY): Payer: Self-pay | Admitting: Hematology

## 2021-10-21 NOTE — H&P (Signed)
Steven Martinez; [id number]; [birth year]   HPI Patient is a 31 year old Hispanic male who returns to my care for surgical treatment of his gastric carcinoma.  He was initially diagnosed in the summer 2022.  A Port-A-Cath was placed and he underwent neoadjuvant chemotherapy.  He had a recent CT scan of the abdomen and pelvis which revealed no residual tumor present.  He has tolerated the chemotherapy well and is now referred for surgical resection. Past Medical History:  Diagnosis Date   Acute upper GI bleed 10/13/2020   gastric ulcer with visible vessel   Alcohol abuse 10/13/2020   Quit Jan 2022   GERD (gastroesophageal reflux disease)    Port-A-Cath in place 06/23/2021   Positive H. pylori test 09/2020   H pylori IgG + Jan 2022 s/p treatment with Prevpac. EGD March 2022 with gastric biopsy negative for H. pylori.     Past Surgical History:  Procedure Laterality Date   BIOPSY  12/03/2020   Procedure: BIOPSY;  Surgeon: Eloise Harman, DO;  Location: [facility];  Service: Endoscopy;;   BIOPSY  05/12/2021   Procedure: BIOPSY;  Surgeon: Eloise Harman, DO;  [facility]: [facility];  Service: Endoscopy;;   ESOPHAGOGASTRODUODENOSCOPY (EGD) WITH PROPOFOL N/A 10/14/2020    Surgeon: Eloise Harman, DO;  one non-bleeding, non-obstructing gastric ulcer with visible vessel s/p bipolar cautery, normal examined esophagus and duodenum.    ESOPHAGOGASTRODUODENOSCOPY (EGD) WITH PROPOFOL N/A 12/03/2020    Surgeon: Eloise Harman, DO;   nonobstructing, nonbleeding gastric ulcer with clean base s/p biopsied, gastritis biopsied, normal examined duodenum.  Pathology with granulation tissue consistent with ulcer, mild chronic gastritis with focal intestinal metaplasia, H. pylori negative.  Repeat in 3 months.   ESOPHAGOGASTRODUODENOSCOPY (EGD) WITH PROPOFOL N/A 05/12/2021   Procedure: ESOPHAGOGASTRODUODENOSCOPY (EGD) WITH PROPOFOL;  Surgeon: Eloise Harman, DO;  [facility]: [facility];  Service:  Endoscopy;  Laterality: N/A;  10:30am   ESOPHAGOGASTRODUODENOSCOPY (EGD) WITH PROPOFOL N/A 06/19/2021   Procedure: ESOPHAGOGASTRODUODENOSCOPY (EGD) WITH PROPOFOL;  Surgeon: [name], MD;  Location: [facility];  Service: Endoscopy;  Laterality: N/A;   EUS N/A 06/19/2021   Procedure: UPPER ENDOSCOPIC ULTRASOUND (EUS) RADIAL;  Surgeon: [name], MD;  Location: [facility];  Service: Endoscopy;  Laterality: N/A;   PORTACATH PLACEMENT Left 06/23/2021   Procedure: INSERTION PORT-A-CATH;  Surgeon: Aviva Signs, MD;  Location: [facility];  Service: General;  Laterality: Left;    Family History  Problem Relation Age of Onset   Diabetes Mellitus II Mother    Hypertension Mother    Colon cancer Neg Hx    Colon polyps Neg Hx     Current Outpatient Medications on File Prior to Visit  Medication Sig Dispense Refill   DOCETAXEL IV Inject into the vein every 14 (fourteen) days.     fluorouracil CALGB 64403 2,400 mg/m2 in sodium chloride 0.9 % 150 mL Inject 2,600 mg/m2 into the vein over 24 hr.     FLUOROURACIL IV Inject into the vein every 14 (fourteen) days.     HYDROcodone-acetaminophen (NORCO) 5-325 MG tablet Take 1 tablet by mouth every 4 (four) hours as needed for moderate pain. (Patient not taking: Reported on 10/02/2021) 20 tablet 0   lidocaine-prilocaine (EMLA) cream Apply a small amount to port a cath site and cover with plastic wrap 1 hour prior to chemotherapy appointments (Patient not taking: Reported on 10/02/2021) 30 g 3   Multiple Vitamin (MULTIVITAMIN WITH MINERALS) TABS tablet Take 1 tablet by mouth in  the morning.     ondansetron (ZOFRAN ODT) 4 MG disintegrating tablet Take 1 tablet (4 mg total) by mouth every 8 (eight) hours as needed for nausea or vomiting. 60 tablet 6   OXALIPLATIN IV Inject into the vein every 14 (fourteen) days.     pantoprazole (PROTONIX) 40 MG tablet Take 1 tablet (40 mg total) by mouth daily. 1 tablet 30 tablet 6   prochlorperazine (COMPAZINE) 10 MG  tablet Take 1 tablet (10 mg total) by mouth every 6 (six) hours as needed (Nausea or vomiting). 30 tablet 6   sucralfate (CARAFATE) 1 GM/10ML suspension Take 10 mLs (1 g total) by mouth 4 (four) times daily -  with meals and at bedtime. (Patient not taking: Reported on 10/02/2021) 420 mL 0   No current facility-administered medications on file prior to visit.    Allergies  Allergen Reactions   Oxaliplatin Itching    Social History   Substance and Sexual Activity  Alcohol Use Not Currently   Alcohol/week: 57.0 - 58.0 standard drinks   Types: 54 Cans of beer, 3 - 4 Shots of liquor per week   Comment: Quit January 2022. Used to drink sixpack daily and 12-15 beers on Saturdays and Sundays.     Social History   Tobacco Use  Smoking Status Never  Smokeless Tobacco Never    Review of Systems  Constitutional: Negative.   HENT: Negative.    Eyes: Negative.   Respiratory: Negative.    Cardiovascular: Negative.   Gastrointestinal:  Positive for abdominal pain.  Genitourinary: Negative.   Musculoskeletal: Negative.   Skin: Negative.   Neurological: Negative.   Endo/Heme/Allergies: Negative.   Psychiatric/Behavioral: Negative.     Objective   Vitals:   10/16/21 1136  BP: 129/88  Pulse: 78  Resp: 14  Temp: 98.4 F (36.9 C)  SpO2: 97%    Physical Exam Vitals reviewed.  Constitutional:      Appearance: Normal appearance. He is normal weight. He is not ill-appearing.  HENT:     Head: Normocephalic and atraumatic.  Cardiovascular:     Rate and Rhythm: Normal rate and regular rhythm.     Heart sounds: Normal heart sounds. No murmur heard.   No friction rub. No gallop.  Pulmonary:     Effort: Pulmonary effort is normal. No respiratory distress.     Breath sounds: Normal breath sounds. No stridor. No wheezing, rhonchi or rales.  Abdominal:     General: Abdomen is flat. Bowel sounds are normal. There is no distension.     Palpations: Abdomen is soft. There is no mass.      Tenderness: There is no abdominal tenderness. There is no guarding or rebound.     Hernia: No hernia is present.  Musculoskeletal:     Cervical back: Normal range of motion and neck supple.  Lymphadenopathy:     Cervical: No cervical adenopathy.  Skin:    General: Skin is warm and dry.  Neurological:     Mental Status: He is alert and oriented to person, place, and time.   Oncology notes reviewed.  CT scan report reviewed. Assessment  Gastric carcinoma, finished with neoadjuvant therapy. Plan  Patient is scheduled for a partial gastrectomy with Roux-en-Y gastrojejunostomy on 11/03/2021.  The risks and benefits of the procedure including bleeding, infection, the finding of residual cancer, and anastomotic leak were fully explained to the patient, who gave informed consent.  His wife was present during the visit.  All questions were answered.

## 2021-10-27 NOTE — Patient Instructions (Addendum)
Instrucciones Para Antes de la Ciruga   Su ciruga est programada para-(your procedure is scheduled on) 11/03/2021   [facility] - (enter)   Main Entrance    Por favor llame al [phone] si tiene algn problema en la maana de la ciruga. (please call if you have any problems the morning of surgery.)                  Recuerde: (Remember)   No coma alimentos ni tome lquidos, incluyendo agua, despus de la medianoche del  (Do not eat food or drink liquids including water after midnight    [employer] en la maana de la ciruga con un SORBITO de agua (take these meds the morning of surgery with a SIP of water) Pantoprazole and zofran or compazine if needed   [employer] dientes en la maana de la ciruga. (you may brush your teeth the morning of surgery)   No use joyas, maquillaje de ojos, lpiz labial, crema para el cuerpo o esmalte de uas oscuro. (Do not wear jewelry, eye makeup, lipstick, body lotion, or dark fingernail polish)   No puede usar desodorante. (you may wear deodorant)   Si va a ser ingresado despues de la ciruga, deje la maleta en el carro hasta que se le haya asignado una habitacin. (If you are to be admitted after surgery, leave suitcase in car until your room has been assigned.)   A los pacientes que se les d de alta el mismo da no se les permitir manejar a casa.  (Patients discharged on the day of surgery will not be allowed to drive home)   Use ropa suelta y cmoda de regreso a casa. (wear loose comfortable clothes for ride home)    Cmo usar clorhexidina para baarse How to Use Chlorhexidine for Bathing El gluconato de clorhexidina (CHG) es una solucin desinfectante (antisptica) que se utiliza para limpiar la piel. Puede eliminar las bacterias que normalmente viven en la piel y mantenerlas alejadas durante aproximadamente 24 horas. Para limpiarse la piel con CHG, es posible que le  den lo siguiente: Una solucin de CHG para usar en la ducha o como parte de un bao de [place]. Un pao preenvasado que contenga CHG. Limpiar la piel con CHG puede ayudar a disminuir el riesgo de infeccin: Mientras permanece en la unidad de cuidados intensivos del hospital. Si tiene un acceso vascular, como una va central, para proporcionar acceso a corto o largo plazo a las venas. Si tiene un catter para que drene orina de la vejiga. Si tiene Animal nutritionist. El respirador es un aparato que lo ayuda a Ambulance person al hacer que el aire entre y salga de sus pulmones. Despus de [place]. Cules son los riesgos? Los riesgos de usar de CHG incluyen los siguientes: Una reaccin en la piel. Prdida de audicin si el CHG entra en sus odos y tiene un tmpano perforado. Lesin ocular si el CHG ingresa en los ojos y no se enjuaga. El CHG es inflamable. Asegrese de no fumar ni acercarse al fuego luego de aplicarse CHG en la piel. No use CHG: Si es alrgico a la clorhexidina o anteriormente tuvo una reaccin a la clorhexidina. En bebs de menos  de 2 meses. Cmo usar la solucin de CHG Use CHG nicamente como se lo indique su mdico y siga las instrucciones de la etiqueta. Use la cantidad de CHG que le indicaron. Usualmente, la medida es una botella. Durante una ducha Siga estos pasos al usar la solucin de CHG durante una ducha (a menos que su mdico le brinde instrucciones diferentes): Comience a ducharse. Lvese la cara y el cabello con el jabn y el champ que utiliza habitualmente. Cierre la [place] o salga de abajo del agua. Vierta el CHG en un pao limpio. No use ningn cepillo o esponja con bordes rugosos. Comience en el cuello y colquese la espuma en todo el cuerpo [employer]. Asegrese de seguir estas instrucciones: Si le harn una ciruga, preste especial atencin a la parte del cuerpo Research officer, political party. Frote la zona durante al menos 1 minuto. No use el CHG en su  cabeza o cara. Si la solucin [employer] odos o los ojos, enjuague bien con [place]. Evite la zona genital. Evite las zonas de la piel que estn lastimadas o tengan cortes o raspaduras. Frote su espalda y [employer]. Asegrese de lavar los pliegues de la piel. Deje actuar la espuma por 1 o 2 minutos, o por el [employer] haya indicado el mdico. Enjuguese todo el cuerpo bajo la ducha. Asegrese de enjuagar bien todos los pliegues y hendiduras de la piel. Seque con una toalla limpia. Despus no aplique ninguna sustancia en el cuerpo, como polvo, locin o perfume, excepto que se lo indique el mdico. Solo use las lociones recomendadas por el fabricante. Pngase pijama o ropa limpia. Si es la noche anterior a la ciruga, duerma en sbanas limpias.  Durante un bao de esponja Siga estos pasos al usar la solucin de CHG durante un bao de Science writer (a menos que su mdico le brinde instrucciones diferentes): [employer] cara y el cabello con el jabn y el champ que utiliza habitualmente. Vierta el CHG en un pao limpio. Comience en el cuello y colquese la espuma en todo el cuerpo [employer]. Asegrese de seguir estas instrucciones: Si le harn una ciruga, preste especial atencin a la parte del cuerpo Research officer, political party. Frote la zona durante al menos 1 minuto. No use el CHG en su cabeza o cara. Si la solucin [employer] odos o los ojos, enjuague bien con [place]. Evite la zona genital. Evite las zonas de la piel que estn lastimadas o tengan cortes o raspaduras. Frote su espalda y [employer]. Asegrese de lavar los pliegues de la piel. Deje actuar la espuma por 1 o 2 minutos, o por el tiempo que le haya indicado el mdico. Con un pao limpio y hmedo diferente, enjuguese bien todo el cuerpo. Asegrese de enjuagar bien todos los pliegues y hendiduras de la piel. Seque con una toalla limpia. Despus no aplique ninguna sustancia en el cuerpo, como polvo, locin o  perfume, excepto que se lo indique el mdico. Solo use las lociones recomendadas por el fabricante. Pngase pijama o ropa limpia. Si es la noche anterior a la ciruga, duerma en sbanas limpias. [employer] paos preenvasados de CHG Use los paos de CHG nicamente como se lo haya indicado el mdico y siga las instrucciones de la etiqueta. Use los paos de CHG sobre la piel limpia y [place]. No use el pao de CHG en la cabeza o el rostro a menos que el  mdico se lo indique. Cuando lave con el pao de CHG: Evite la zona genital. Evite las zonas de la piel que estn lastimadas o tengan cortes o raspaduras. Antes de la ciruga Siga estos pasos al usar un pao de CHG para limpiar antes de [place] (a menos que su mdico le brinde instrucciones diferentes): Con el pao de CHG, frtese con firmeza la parte del cuerpo donde le realizarn la [place]. Frtese de un lado al otro durante 3 minutos. El rea del cuerpo debe estar completamente hmeda con CHG cuando termine de frotar. No enjuague. Deseche el pao y deje que el rea se seque al aire. Despus no aplique ninguna sustancia sobre el rea, como polvos, lociones o perfume. Pngase pijama o ropa limpia. Si es la noche anterior a la ciruga, duerma en sbanas limpias.  Para baarse en general Siga estos pasos al usar el pao de CHG para baarse en general (a menos que su mdico le brinde instrucciones diferentes). Use un pao de CHG diferente en cada rea del cuerpo. Asegrese de [employer] de la piel y [place] dedos de las manos y de los pies. Lvese el cuerpo en el siguiente orden, usando un pao nuevo despus de cada paso: La parte delantera del cuello, los hombros y Management consultant. Ambos brazos, debajo de los brazos y las [place]. El estmago y la zona de la ingle, evitando los genitales. La pierna y el pie derechos. La pierna y el pie izquierdos. La parte posterior del cuello, la espalda y las nalgas. No enjuague. Deseche el pao  y deje que el rea se seque al aire. Despus no aplique ninguna sustancia en el cuerpo, como polvo, locin o perfume, excepto que se lo indique el mdico. Solo use las lociones recomendadas por el fabricante. Pngase pijama o ropa limpia. Comunquese con un mdico si: La piel se irrita despus de frotarla. Tiene preguntas sobre cmo usar la solucin o el pao. Traga un poco de clorhexidina. Llame al centro de toxicologa local [phone] en los [place]). Solicite ayuda de inmediato si: Los ojos le pican mucho, o se ponen muy rojos o se le hinchan. Siente picazn intensa en la piel y est roja o hinchada. Nota cambios en su audicin. Tiene dificultad para ver. Tiene hinchazn u hormigueo en la garganta o la boca. Tiene dificultad para respirar. Estos sntomas pueden representar un problema grave que constituye Engineer, maintenance (IT). No espere a ver si los sntomas desaparecen. Solicite atencin mdica de inmediato. Comunquese con el servicio de emergencias de su localidad (911 en los [place]). No conduzca por sus propios medios [employer]. Resumen El gluconato de clorhexidina (CHG) es una solucin desinfectante (antisptica) que se utiliza para limpiar la piel. Limpiar la piel con CHG puede ayudar a disminuir el riesgo de infeccin. Es posible que le indiquen que utilice CHG para baarse. Esta solucin puede venir en botella o en un pao preenvasado para que use sobre su piel. Siga cuidadosamente las instrucciones del mdico y las instrucciones que se encuentran en la etiqueta del producto. No use CHG si es alrgico a la clorhexidina. Pngase en contacto con su mdico si se le irrita la piel luego de frotar. Esta informacin no tiene Marine scientist el consejo del mdico. Asegrese de hacerle al mdico cualquier pregunta que tenga. Document Revised: 12/10/2020 Document Reviewed: 12/10/2020 Elsevier Patient Education  2022 [place] parcial, cuidados  posteriores Partial Gastrectomy, Care After [employer] Corporation le brinda informacin sobre cmo  cuidarse despus del procedimiento. El mdico tambin podr darle instrucciones ms especficas. Comunquese con el mdico si tiene problemas o preguntas. Qu puedo esperar despus del procedimiento? Despus del procedimiento, es comn [employer] siguientes sntomas: Acidez estomacal o diarrea despus de comer. Esto suele desaparecer con el tiempo. Dolor o Manufacturing systems engineer. Siga estas instrucciones en su casa: Medicamentos Tome los medicamentos de venta libre y los recetados solamente como se lo haya indicado el mdico. Pregntele al mdico si el medicamento recetado: Hace que sea necesario que evite conducir o usar maquinaria pesada. Puede causarle estreimiento. Es posible que tenga que tomar estas medidas para prevenir o tratar el estreimiento: Electronics engineer suficiente lquido como para Theatre manager la orina de color amarillo plido. Tomar medicamentos recetados o de [employer]. Comer alimentos blandos que contengan fibra, como verduras y frutas cocidas. Limitar el consumo de alimentos ricos en grasa y azcares procesados, como los alimentos fritos o dulces. Comida y bebida  Siga las instrucciones del mdico respecto de las restricciones en las comidas o las bebidas. Puede beber lquidos claros mientras comienza la recuperacin. Ingiera comidas blandas y fciles de digerir en pequeas durante el da, en lugar de comidas abundantes. Tomar comidas grandes puede causar una afeccin llamada sndrome de evacuacin gstrica rpida, que causa calambres, nuseas, mareos y [place]. Ingiera alimentos con alto contenido de calcio, hierro, vitamina C y vitamina D. Tome los suplementos vitamnicos como se lo haya indicado el mdico. Lleve un registro de los alimentos para identificar si alguno le causa problemas. Cuidado de la incisin  Siga las instrucciones del mdico acerca del cuidado de las incisiones.  Asegrese de hacer lo siguiente: Lvese las manos con agua y [place] antes y despus de [place]arry manager la venda (vendaje). Use desinfectante para manos si no dispone de [place] y [place]. Cambie el vendaje como se lo haya indicado el mdico. No retire los puntos (suturas), la goma para cerrar la piel o las tiras [place]. Es posible que estos cierres cutneos deban quedar puestos en la piel durante 2 semanas o ms tiempo. Si los bordes de las tiras adhesivas empiezan a despegarse y Therapist, sports, puede recortar los que estn sueltos. No retire las tiras [facility] por completo a menos que el mdico se lo indique. Controle todos los [employer] lugar de las incisiones para Hydrographic surveyor signos de infeccin. Est atento a los siguientes signos: Enrojecimiento, [place]. Lquido o sangre. Calor. Pus o mal olor. Actividad Haga reposo como se lo haya indicado el mdico. Evite estar sentado durante largos perodos sin moverse. Levntese y camine un poco cada 1 a 2 horas. Esto es importante para mejorar el flujo sanguneo y la respiracin. Pida ayuda si se siente dbil o inestable. Incorpore gradualmente actividades livianas a su rutina diaria. Retome sus actividades normales segn lo indicado por el mdico. Pregntele al mdico qu actividades son seguras para usted. No conduzca hasta que el mdico le diga que puede [place]. No levante ningn objeto que pese ms de 10 libras (4.5 kg) o que supere el lmite de peso que le hayan indicado, Nurse, children's que el mdico le diga que puede [place]. Instrucciones generales No tome baos de inmersin, no nade ni use el jacuzzi hasta que el mdico lo autorice. Pregntele al mdico si puede ducharse. [place] Pounds solo le permitan darse baos de [place]. Use medias de compresin como se lo haya indicado su mdico. Estas medias ayudan a evitar la formacin de cogulos de [place] y a reducir la hinchazn de las piernas. No  consuma ningn producto que contenga nicotina o tabaco, como cigarrillos,  cigarrillos electrnicos y tabaco de Higher education careers adviser. Si necesita ayuda para dejar de fumar, consulte al [employer]. Haga los ejercicios de toser y de respiracin profunda como se lo hayan indicado. Concurra a todas las visitas de seguimiento como se lo haya indicado el mdico. Esto es importante. Comunquese con un mdico si: Alguno de los siguientes sntomas no mejora con el medicamento o el tratamiento indicado por su mdico: Social research officer, government. Nuseas y vmitos. Acidez estomacal. Diarrea. Siente dolor, tiene gases, sensacin de presin o calambres en el abdomen. Se siente confundido. Siente que va a desvanecerse. Tiene nuevos sntomas. Nota enrojecimiento o hinchazn, o siente dolor alrededor de una incisin. Percibe que sale mal olor del lugar de la incisin. La zona de las incisiones se siente caliente al tacto. Tiene fiebre o escalofros. Las heces no son firmes con Physiological scientist. Solicite ayuda inmediatamente si: El dolor abdominal persiste o se agrava. Le sale lquido, sangre o pus de una incisin. Sigue vomitando. Tiene latidos cardacos irregulares. Tiene dolor en el pecho o dificultad para respirar. Le aparece hinchazn o dolor en las piernas, los pies o la parte posterior inferior de las piernas (pantorrillas). Resumen Despus de este procedimiento, es comn tener acidez estomacal o diarrea despus de comer. Le indicarn cmo cuidar de la incisin o las incisiones, qu comer y Electronics engineer, y qu actividades Nurse, adult. Tome los medicamentos de venta libre y los recetados solamente como se lo haya indicado el mdico. Consulting civil engineer a todas las visitas de seguimiento como se lo haya indicado el mdico. Esto es importante. Obtenga ayuda de inmediato si tiene dolor que no desaparece, contina vomitando o comienza a tener latidos Licensed conveyancer. Esta informacin no tiene Marine scientist el consejo del mdico. Asegrese de hacerle al mdico cualquier pregunta que tenga. Document Revised: 11/02/2018  Document Reviewed: 11/02/2018 Elsevier Patient Education  2022 [place] general en adultos, cuidados posteriores General Anesthesia, Adult, Care After Esta hoja le brinda informacin sobre cmo cuidarse despus del procedimiento. El mdico tambin podr darle instrucciones ms especficas. Comunquese con el mdico si tiene problemas o preguntas. Qu puedo esperar despus del procedimiento? Luego del procedimiento, son comunes los siguiente efectos secundarios: Dolor o Scientist, research (life sciences) en el lugar de la va intravenosa (i.v.). Nuseas. Vmitos. El dolor de Investment banker, operational. Dificultad para concentrarte. Sentir fro o [employer]. Sensacin de debilidad o cansancio. Somnolencia y Programmer, applications. Malestar y Hydrologist. Estos efectos secundarios pueden afectar partes del cuerpo que no estuvieron involucradas en la ciruga. Siga estas instrucciones en su casa: Durante el perodo de [employer] le haya indicado el mdico:  Descanse. No participe en actividades que impliquen posibles cadas o lesiones. No conduzca ni opere maquinaria. No beba alcohol. No tome somnferos ni medicamentos que causen somnolencia. No tome decisiones trascendentes ni firme documentos importantes. No cuide a nios por su cuenta. Comida y bebida Siga las indicaciones del mdico respecto de las restricciones de comidas o bebidas. Cuando [employer], comience a comer cantidades pequeas de alimentos que sean blandos y fciles de Publishing copy (livianos), como una tostada. Retome su dieta habitual de forma gradual. Beber suficiente lquido como para mantener la orina de color amarillo plido. Si vomita, rehidrtese tomando agua, jugo o caldo transparente. Instrucciones generales Si tiene apnea del sueo, la [place] y ciertos medicamentos pueden elevar su riesgo de tener problemas respiratorios. Siga las instrucciones del mdico respecto al uso del dispositivo para dormir: Siempre que duerma, [place] siestas  que  tome en el da. Mientras tome analgsicos recetados, medicamentos para dormir o medicamentos que producen somnolencia. Pida a un adulto responsable que se quede con usted durante el tiempo que se le indique. Es importante tener a alguien que lo ayude a cuidarse hasta que est despierto y Press photographer. Retome sus actividades normales segn lo indicado por el mdico. Pregntele al mdico qu actividades son seguras para usted. Use los medicamentos de venta libre y los recetados solamente como se lo haya indicado el mdico. Si fuma, no lo haga sin supervisin. Concurra a todas las visitas de seguimiento como se lo haya indicado el mdico. Esto es importante. Comunquese con un mdico si: Tiene nuseas o vmitos que no mejoran con medicamentos. No puede comer ni beber sin vomitar. Su dolor no se alivia con medicamentos. No puede orinar. Tiene una erupcin cutnea. Tiene fiebre. Presenta enrojecimiento alrededor del lugar de la va intravenosa (i.v.) que empeora. Solicite ayuda de inmediato si: Tienes dificultad para respirar. Sientes dolor en el pecho. Observa sangre en la orina o heces, o vomita sangre. Resumen Despus del procedimiento, es comn tener dolor de garganta y nuseas. Tambin es comn sentirse cansado. Pida a un adulto responsable que se quede con usted durante el tiempo que se le indique. Es importante tener a alguien que lo ayude a cuidarse hasta que est despierto y Press photographer. Cuando [employer], comience a comer cantidades pequeas de alimentos que sean blandos y fciles de Publishing copy (livianos), como una tostada. Retome su dieta habitual de forma gradual. Beber suficiente lquido como para mantener la orina de color amarillo plido. Retome sus actividades normales segn lo indicado por el mdico. Pregntele al mdico qu actividades son seguras para usted. Esta informacin no tiene Marine scientist el consejo del mdico. Asegrese de hacerle al mdico cualquier pregunta  que tenga. Document Revised: 02/29/2020 Document Reviewed: 02/29/2020 Elsevier Patient Education  [place].

## 2021-10-30 ENCOUNTER — Other Ambulatory Visit (HOSPITAL_COMMUNITY)
Admission: RE | Admit: 2021-10-30 | Discharge: 2021-10-30 | Disposition: A | Discharge status: Home / Self Care | Payer: 59 | Source: Ambulatory Visit | Attending: General Surgery | Admitting: General Surgery

## 2021-10-30 ENCOUNTER — Encounter (HOSPITAL_COMMUNITY)
Admission: RE | Admit: 2021-10-30 | Discharge: 2021-10-30 | Disposition: A | Discharge status: Home / Self Care | Payer: 59 | Source: Ambulatory Visit | Attending: General Surgery | Admitting: General Surgery

## 2021-10-30 VITALS — BP 132/85 | HR 82 | Temp 98.4°F | Resp 16 | Ht 67.0 in | Wt 150.0 lb

## 2021-10-30 DIAGNOSIS — Z20822 Contact with and (suspected) exposure to covid-19: Secondary | ICD-10-CM | POA: Insufficient documentation

## 2021-10-30 DIAGNOSIS — C163 Malignant neoplasm of pyloric antrum: Secondary | ICD-10-CM | POA: Diagnosis not present

## 2021-10-30 DIAGNOSIS — Z01812 Encounter for preprocedural laboratory examination: Secondary | ICD-10-CM | POA: Insufficient documentation

## 2021-10-30 DIAGNOSIS — Z01818 Encounter for other preprocedural examination: Secondary | ICD-10-CM

## 2021-10-30 LAB — CBC WITH DIFFERENTIAL/PLATELET
Abs Immature Granulocytes: 0.01 10*3/uL (ref 0.00–0.07)
Basophils Absolute: 0.1 10*3/uL (ref 0.0–0.1)
Basophils Relative: 2 %
Eosinophils Absolute: 0.1 10*3/uL (ref 0.0–0.5)
Eosinophils Relative: 2 %
HCT: 41.1 % (ref 39.0–52.0)
Hemoglobin: 13 g/dL (ref 13.0–17.0)
Immature Granulocytes: 0 %
Lymphocytes Relative: 42 %
Lymphs Abs: 1.4 10*3/uL (ref 0.7–4.0)
MCH: 27.5 pg (ref 26.0–34.0)
MCHC: 31.6 g/dL (ref 30.0–36.0)
MCV: 86.9 fL (ref 80.0–100.0)
Monocytes Absolute: 0.3 10*3/uL (ref 0.1–1.0)
Monocytes Relative: 10 %
Neutro Abs: 1.5 10*3/uL — ABNORMAL LOW (ref 1.7–7.7)
Neutrophils Relative %: 44 %
Platelets: 211 10*3/uL (ref 150–400)
RBC: 4.73 MIL/uL (ref 4.22–5.81)
RDW: 17.8 % — ABNORMAL HIGH (ref 11.5–15.5)
WBC: 3.4 10*3/uL — ABNORMAL LOW (ref 4.0–10.5)
nRBC: 0 % (ref 0.0–0.2)

## 2021-10-30 LAB — COMPREHENSIVE METABOLIC PANEL
ALT: 34 U/L (ref 0–44)
AST: 28 U/L (ref 15–41)
Albumin: 3.8 g/dL (ref 3.5–5.0)
Alkaline Phosphatase: 73 U/L (ref 38–126)
Anion gap: 5 (ref 5–15)
BUN: 13 mg/dL (ref 6–20)
CO2: 26 mmol/L (ref 22–32)
Calcium: 8.6 mg/dL — ABNORMAL LOW (ref 8.9–10.3)
Chloride: 106 mmol/L (ref 98–111)
Creatinine, Ser: 0.71 mg/dL (ref 0.61–1.24)
GFR, Estimated: >60 mL/min (ref >60)
Glucose, Bld: 102 mg/dL — ABNORMAL HIGH (ref 70–99)
Potassium: 3.6 mmol/L (ref 3.5–5.1)
Sodium: 137 mmol/L (ref 135–145)
Total Bilirubin: 0.5 mg/dL (ref 0.3–1.2)
Total Protein: 6.5 g/dL (ref 6.5–8.1)

## 2021-10-30 LAB — SARS CORONAVIRUS 2 (TAT 6-24 HRS): SARS Coronavirus 2: NEGATIVE

## 2021-11-03 ENCOUNTER — Encounter (HOSPITAL_COMMUNITY)
Admission: RE | Disposition: A | Discharge status: Home / Self Care | Payer: Self-pay | Source: Home / Self Care | Attending: General Surgery

## 2021-11-03 ENCOUNTER — Other Ambulatory Visit: Payer: Self-pay

## 2021-11-03 ENCOUNTER — Inpatient Hospital Stay (HOSPITAL_COMMUNITY)
Admission: RE | Admit: 2021-11-03 | Discharge: 2021-11-07 | DRG: 327 | Disposition: A | Discharge status: Home / Self Care | Payer: 59 | Attending: General Surgery | Admitting: General Surgery

## 2021-11-03 ENCOUNTER — Inpatient Hospital Stay (HOSPITAL_COMMUNITY): Payer: 59 | Admitting: Certified Registered Nurse Anesthetist

## 2021-11-03 ENCOUNTER — Encounter (HOSPITAL_COMMUNITY): Payer: Self-pay | Admitting: General Surgery

## 2021-11-03 DIAGNOSIS — Z888 Allergy status to other drugs, medicaments and biological substances status: Secondary | ICD-10-CM

## 2021-11-03 DIAGNOSIS — C169 Malignant neoplasm of stomach, unspecified: Secondary | ICD-10-CM | POA: Diagnosis not present

## 2021-11-03 DIAGNOSIS — R5383 Other fatigue: Secondary | ICD-10-CM | POA: Diagnosis not present

## 2021-11-03 DIAGNOSIS — Z903 Acquired absence of stomach [part of]: Secondary | ICD-10-CM

## 2021-11-03 DIAGNOSIS — Z79899 Other long term (current) drug therapy: Secondary | ICD-10-CM

## 2021-11-03 DIAGNOSIS — N179 Acute kidney failure, unspecified: Secondary | ICD-10-CM | POA: Diagnosis not present

## 2021-11-03 DIAGNOSIS — C163 Malignant neoplasm of pyloric antrum: Secondary | ICD-10-CM

## 2021-11-03 DIAGNOSIS — I9581 Postprocedural hypotension: Secondary | ICD-10-CM | POA: Diagnosis not present

## 2021-11-03 DIAGNOSIS — K219 Gastro-esophageal reflux disease without esophagitis: Secondary | ICD-10-CM | POA: Diagnosis not present

## 2021-11-03 DIAGNOSIS — Z9221 Personal history of antineoplastic chemotherapy: Secondary | ICD-10-CM | POA: Diagnosis not present

## 2021-11-03 DIAGNOSIS — D62 Acute posthemorrhagic anemia: Secondary | ICD-10-CM | POA: Diagnosis not present

## 2021-11-03 HISTORY — DX: Malignant (primary) neoplasm, unspecified: C80.1

## 2021-11-03 HISTORY — PX: GASTRECTOMY: SHX58

## 2021-11-03 LAB — GLUCOSE, CAPILLARY: Glucose-Capillary: 178 mg/dL — ABNORMAL HIGH (ref 70–99)

## 2021-11-03 LAB — HEMOGLOBIN AND HEMATOCRIT, BLOOD
HCT: 29.7 % — ABNORMAL LOW (ref 39.0–52.0)
Hemoglobin: 9.5 g/dL — ABNORMAL LOW (ref 13.0–17.0)

## 2021-11-03 SURGERY — GASTRECTOMY, TOTAL
Anesthesia: General | Site: Abdomen

## 2021-11-03 MED ORDER — LACTATED RINGERS IV SOLN
INTRAVENOUS | Status: DC | PRN
Start: 2021-11-03 — End: 2021-11-03

## 2021-11-03 MED ORDER — ROCURONIUM BROMIDE 10 MG/ML (PF) SYRINGE
PREFILLED_SYRINGE | INTRAVENOUS | Status: DC | PRN
Start: 2021-11-03 — End: 2021-11-03
  Administered 2021-11-03: 20 mg via INTRAVENOUS
  Administered 2021-11-03: 60 mg via INTRAVENOUS
  Administered 2021-11-03: 20 mg via INTRAVENOUS

## 2021-11-03 MED ORDER — HYDROMORPHONE HCL 1 MG/ML IJ SOLN
1.0000 mg | INTRAMUSCULAR | Status: DC | PRN
  Administered 2021-11-03: 1 mg via INTRAVENOUS
  Filled 2021-11-03: qty 1

## 2021-11-03 MED ORDER — SODIUM CHLORIDE 0.9 % IV BOLUS
1000.0000 mL | Freq: Once | INTRAVENOUS | Status: AC
  Administered 2021-11-03: 1000 mL via INTRAVENOUS

## 2021-11-03 MED ORDER — CEFAZOLIN SODIUM-DEXTROSE 2-4 GM/100ML-% IV SOLN
2.0000 g | INTRAVENOUS | Status: AC
  Administered 2021-11-03: 2 g via INTRAVENOUS

## 2021-11-03 MED ORDER — KETOROLAC TROMETHAMINE 30 MG/ML IJ SOLN
30.0000 mg | Freq: Four times a day (QID) | INTRAMUSCULAR | Status: DC | PRN
  Administered 2021-11-03 – 2021-11-04 (×4): 30 mg via INTRAVENOUS
  Filled 2021-11-03 (×3): qty 1

## 2021-11-03 MED ORDER — CHLORHEXIDINE GLUCONATE 0.12 % MT SOLN: 15.0000 mL | Freq: Once | OROMUCOSAL | Status: DC

## 2021-11-03 MED ORDER — POVIDONE-IODINE 10 % OINT PACKET
TOPICAL_OINTMENT | CUTANEOUS | Status: DC | PRN
  Administered 2021-11-03: 1 via TOPICAL

## 2021-11-03 MED ORDER — CEFAZOLIN SODIUM-DEXTROSE 2-4 GM/100ML-% IV SOLN
INTRAVENOUS | Status: AC
  Filled 2021-11-03: qty 100

## 2021-11-03 MED ORDER — PROPOFOL 10 MG/ML IV BOLUS
INTRAVENOUS | Status: DC | PRN
Start: 2021-11-03 — End: 2021-11-03
  Administered 2021-11-03: 160 mg via INTRAVENOUS

## 2021-11-03 MED ORDER — MIDAZOLAM HCL 2 MG/2ML IJ SOLN
INTRAMUSCULAR | Status: AC
  Filled 2021-11-03: qty 2

## 2021-11-03 MED ORDER — OXYCODONE HCL 5 MG PO TABS
5.0000 mg | ORAL_TABLET | ORAL | Status: DC | PRN
  Administered 2021-11-04 – 2021-11-06 (×6): 10 mg via ORAL
  Administered 2021-11-06: 5 mg via ORAL
  Administered 2021-11-06: 10 mg via ORAL
  Administered 2021-11-07: 5 mg via ORAL
  Filled 2021-11-03: qty 2
  Filled 2021-11-03: qty 1
  Filled 2021-11-03 (×3): qty 2
  Filled 2021-11-03: qty 1
  Filled 2021-11-03 (×3): qty 2

## 2021-11-03 MED ORDER — ACETAMINOPHEN 325 MG PO TABS
650.0000 mg | ORAL_TABLET | Freq: Four times a day (QID) | ORAL | Status: DC | PRN
  Administered 2021-11-04: 650 mg via ORAL
  Filled 2021-11-03: qty 2

## 2021-11-03 MED ORDER — ACETAMINOPHEN 650 MG RE SUPP: 650.0000 mg | Freq: Four times a day (QID) | RECTAL | Status: DC | PRN

## 2021-11-03 MED ORDER — ENOXAPARIN SODIUM 40 MG/0.4ML IJ SOSY: 40.0000 mg | PREFILLED_SYRINGE | INTRAMUSCULAR | Status: DC

## 2021-11-03 MED ORDER — MIDAZOLAM HCL 2 MG/2ML IJ SOLN
INTRAMUSCULAR | Status: DC | PRN
  Administered 2021-11-03: 2 mg via INTRAVENOUS

## 2021-11-03 MED ORDER — POVIDONE-IODINE 10 % EX OINT
TOPICAL_OINTMENT | CUTANEOUS | Status: AC
  Filled 2021-11-03: qty 2

## 2021-11-03 MED ORDER — ONDANSETRON 4 MG PO TBDP: 4.0000 mg | ORAL_TABLET | Freq: Four times a day (QID) | ORAL | Status: DC | PRN

## 2021-11-03 MED ORDER — BUPIVACAINE LIPOSOME 1.3 % IJ SUSP
INTRAMUSCULAR | Status: AC
  Filled 2021-11-03: qty 20

## 2021-11-03 MED ORDER — CHLORHEXIDINE GLUCONATE CLOTH 2 % EX PADS: 6.0000 | MEDICATED_PAD | Freq: Once | CUTANEOUS | Status: DC

## 2021-11-03 MED ORDER — DEXAMETHASONE SODIUM PHOSPHATE 10 MG/ML IJ SOLN
INTRAMUSCULAR | Status: AC
  Filled 2021-11-03: qty 1

## 2021-11-03 MED ORDER — ONDANSETRON HCL 4 MG/2ML IJ SOLN
INTRAMUSCULAR | Status: AC
  Filled 2021-11-03: qty 2

## 2021-11-03 MED ORDER — LACTATED RINGERS IV SOLN: INTRAVENOUS | Status: DC

## 2021-11-03 MED ORDER — KETOROLAC TROMETHAMINE 30 MG/ML IJ SOLN
INTRAMUSCULAR | Status: AC
  Filled 2021-11-03: qty 1

## 2021-11-03 MED ORDER — KETAMINE HCL 50 MG/5ML IJ SOSY
PREFILLED_SYRINGE | INTRAMUSCULAR | Status: AC
  Filled 2021-11-03: qty 5

## 2021-11-03 MED ORDER — SIMETHICONE 80 MG PO CHEW
40.0000 mg | CHEWABLE_TABLET | Freq: Four times a day (QID) | ORAL | Status: DC | PRN
  Administered 2021-11-04 – 2021-11-05 (×2): 40 mg via ORAL
  Filled 2021-11-03 (×2): qty 1

## 2021-11-03 MED ORDER — SODIUM CHLORIDE 0.9 % IV SOLN: INTRAVENOUS | Status: DC

## 2021-11-03 MED ORDER — ONDANSETRON HCL 4 MG/2ML IJ SOLN
INTRAMUSCULAR | Status: DC | PRN
  Administered 2021-11-03: 4 mg via INTRAVENOUS

## 2021-11-03 MED ORDER — HYDROMORPHONE HCL 1 MG/ML IJ SOLN
INTRAMUSCULAR | Status: AC
  Filled 2021-11-03: qty 0.5

## 2021-11-03 MED ORDER — FENTANYL CITRATE (PF) 250 MCG/5ML IJ SOLN
INTRAMUSCULAR | Status: AC
  Filled 2021-11-03: qty 5

## 2021-11-03 MED ORDER — SUGAMMADEX SODIUM 200 MG/2ML IV SOLN
INTRAVENOUS | Status: DC | PRN
  Administered 2021-11-03: 200 mg via INTRAVENOUS

## 2021-11-03 MED ORDER — ENOXAPARIN SODIUM 40 MG/0.4ML IJ SOSY
40.0000 mg | PREFILLED_SYRINGE | Freq: Once | INTRAMUSCULAR | Status: AC
  Administered 2021-11-03: 40 mg via SUBCUTANEOUS
  Filled 2021-11-03: qty 0.4

## 2021-11-03 MED ORDER — HYDROMORPHONE HCL 1 MG/ML IJ SOLN
0.5000 mg | INTRAMUSCULAR | Status: DC | PRN
  Administered 2021-11-03 (×2): 0.5 mg via INTRAVENOUS
  Filled 2021-11-03: qty 0.5

## 2021-11-03 MED ORDER — ORAL CARE MOUTH RINSE: 15.0000 mL | Freq: Once | OROMUCOSAL | Status: DC

## 2021-11-03 MED ORDER — ONDANSETRON HCL 4 MG/2ML IJ SOLN
4.0000 mg | Freq: Four times a day (QID) | INTRAMUSCULAR | Status: DC | PRN
  Administered 2021-11-03 – 2021-11-04 (×2): 4 mg via INTRAVENOUS
  Filled 2021-11-03 (×2): qty 2

## 2021-11-03 MED ORDER — HEMOSTATIC AGENTS (NO CHARGE) OPTIME
TOPICAL | Status: DC | PRN
  Administered 2021-11-03: 1 via TOPICAL

## 2021-11-03 MED ORDER — LIDOCAINE HCL (CARDIAC) PF 100 MG/5ML IV SOSY
PREFILLED_SYRINGE | INTRAVENOUS | Status: DC | PRN
  Administered 2021-11-03: 60 mg via INTRATRACHEAL

## 2021-11-03 MED ORDER — ONDANSETRON HCL 4 MG/2ML IJ SOLN
4.0000 mg | Freq: Once | INTRAMUSCULAR | Status: AC | PRN
  Administered 2021-11-03: 4 mg via INTRAVENOUS
  Filled 2021-11-03: qty 2

## 2021-11-03 MED ORDER — PANTOPRAZOLE SODIUM 40 MG IV SOLR
40.0000 mg | Freq: Every day | INTRAVENOUS | Status: DC
  Administered 2021-11-03 – 2021-11-06 (×4): 40 mg via INTRAVENOUS
  Filled 2021-11-03 (×4): qty 10

## 2021-11-03 MED ORDER — KETAMINE HCL 10 MG/ML IJ SOLN
INTRAMUSCULAR | Status: DC | PRN
  Administered 2021-11-03 (×2): 10 mg via INTRAVENOUS
  Administered 2021-11-03: 20 mg via INTRAVENOUS

## 2021-11-03 MED ORDER — FENTANYL CITRATE PF 50 MCG/ML IJ SOSY
50.0000 ug | PREFILLED_SYRINGE | INTRAMUSCULAR | Status: DC | PRN
  Administered 2021-11-03 – 2021-11-05 (×9): 50 ug via INTRAVENOUS
  Filled 2021-11-03 (×11): qty 1

## 2021-11-03 MED ORDER — 0.9 % SODIUM CHLORIDE (POUR BTL) OPTIME
TOPICAL | Status: DC | PRN
Start: 2021-11-03 — End: 2021-11-03
  Administered 2021-11-03 (×2): 1000 mL

## 2021-11-03 MED ORDER — FENTANYL CITRATE PF 50 MCG/ML IJ SOSY
25.0000 ug | PREFILLED_SYRINGE | INTRAMUSCULAR | Status: DC | PRN
  Administered 2021-11-03 (×3): 50 ug via INTRAVENOUS
  Filled 2021-11-03 (×3): qty 1

## 2021-11-03 MED ORDER — DEXAMETHASONE SODIUM PHOSPHATE 10 MG/ML IJ SOLN
INTRAMUSCULAR | Status: DC | PRN
  Administered 2021-11-03: 10 mg via INTRAVENOUS

## 2021-11-03 MED ORDER — BUPIVACAINE LIPOSOME 1.3 % IJ SUSP
INTRAMUSCULAR | Status: DC | PRN
  Administered 2021-11-03: 20 mL

## 2021-11-03 MED ORDER — KETOROLAC TROMETHAMINE 30 MG/ML IJ SOLN: 30.0000 mg | Freq: Four times a day (QID) | INTRAMUSCULAR | Status: AC

## 2021-11-03 MED ORDER — FENTANYL CITRATE (PF) 250 MCG/5ML IJ SOLN
INTRAMUSCULAR | Status: DC | PRN
Start: 2021-11-03 — End: 2021-11-03
  Administered 2021-11-03 (×2): 50 ug via INTRAVENOUS
  Administered 2021-11-03: 100 ug via INTRAVENOUS
  Administered 2021-11-03: 50 ug via INTRAVENOUS

## 2021-11-03 MED ORDER — LORAZEPAM 2 MG/ML IJ SOLN: 1.0000 mg | INTRAMUSCULAR | Status: DC | PRN

## 2021-11-03 SURGICAL SUPPLY — 58 items
APPLICATOR COTTON TIP 6 STRL (MISCELLANEOUS) IMPLANT
APPLICATOR COTTON TIP 6IN STRL (MISCELLANEOUS) ×2
CATH FOLEY 2WAY SLVR  5CC 14FR (CATHETERS) ×1
CATH FOLEY 2WAY SLVR 5CC 14FR (CATHETERS) IMPLANT
CHLORAPREP W/TINT 26 (MISCELLANEOUS) ×2 IMPLANT
CLOTH BEACON ORANGE TIMEOUT ST (SAFETY) ×2 IMPLANT
COVER LIGHT HANDLE STERIS (MISCELLANEOUS) ×4 IMPLANT
DRAIN PENROSE 0.5X18 (DRAIN) ×2 IMPLANT
DRAPE WARM FLUID 44X44 (DRAPES) ×2 IMPLANT
DRSG OPSITE POSTOP 4X8 (GAUZE/BANDAGES/DRESSINGS) ×2 IMPLANT
ELECT BLADE 6 FLAT ULTRCLN (ELECTRODE) ×1 IMPLANT
ELECT REM PT RETURN 9FT ADLT (ELECTROSURGICAL) ×2
ELECTRODE REM PT RTRN 9FT ADLT (ELECTROSURGICAL) ×1 IMPLANT
EVACUATOR DRAINAGE 10X20 100CC (DRAIN) ×1 IMPLANT
EVACUATOR SILICONE 100CC (DRAIN) ×1
GAUZE SPONGE 4X4 12PLY STRL (GAUZE/BANDAGES/DRESSINGS) ×2 IMPLANT
GLOVE SURG LTX SZ6 (GLOVE) ×1 IMPLANT
GLOVE SURG POLYISO LF SZ7.5 (GLOVE) ×2 IMPLANT
GLOVE SURG UNDER POLY LF SZ7 (GLOVE) ×5 IMPLANT
GLOVE SURG UNDER POLY LF SZ7.5 (GLOVE) ×3 IMPLANT
GOWN STRL REUS W/TWL LRG LVL3 (GOWN DISPOSABLE) ×6 IMPLANT
HANDLE SUCTION POOLE (INSTRUMENTS) ×1 IMPLANT
HEMOSTAT SNOW SURGICEL 2X4 (HEMOSTASIS) ×1 IMPLANT
INST SET MAJOR GENERAL (KITS) ×2 IMPLANT
KIT TURNOVER KIT A (KITS) ×2 IMPLANT
LIGASURE IMPACT 36 18CM CVD LR (INSTRUMENTS) ×2 IMPLANT
MANIFOLD NEPTUNE II (INSTRUMENTS) ×2 IMPLANT
NDL HYPO 18GX1.5 BLUNT FILL (NEEDLE) IMPLANT
NDL HYPO 21X1.5 SAFETY (NEEDLE) IMPLANT
NEEDLE HYPO 18GX1.5 BLUNT FILL (NEEDLE) ×4 IMPLANT
NEEDLE HYPO 21X1.5 SAFETY (NEEDLE) ×2 IMPLANT
NS IRRIG 1000ML POUR BTL (IV SOLUTION) ×3 IMPLANT
PACK MAJOR ABDOMINAL (CUSTOM PROCEDURE TRAY) ×2 IMPLANT
PAD ABD 5X9 TENDERSORB (GAUZE/BANDAGES/DRESSINGS) ×2 IMPLANT
PAD ARMBOARD 7.5X6 YLW CONV (MISCELLANEOUS) ×2 IMPLANT
RELOAD PROXIMATE 75MM BLUE (ENDOMECHANICALS) ×4 IMPLANT
RELOAD STAPLE 75 3.8 BLU REG (ENDOMECHANICALS) IMPLANT
RELOAD STAPLER LINEAR PROX 30 (STAPLE) ×1 IMPLANT
RETRACTOR WND ALEXIS-O 25 LRG (MISCELLANEOUS) IMPLANT
RTRCTR WOUND ALEXIS O 25CM LRG (MISCELLANEOUS) ×2
SET BASIN LINEN APH (SET/KITS/TRAYS/PACK) ×2 IMPLANT
SPONGE DRAIN TRACH 4X4 STRL 2S (GAUZE/BANDAGES/DRESSINGS) ×2 IMPLANT
SPONGE INTESTINAL PEANUT (DISPOSABLE) ×1 IMPLANT
SPONGE T-LAP 18X18 ~~LOC~~+RFID (SPONGE) ×3 IMPLANT
STAPLER GUN LINEAR PROX 60 (STAPLE) ×2 IMPLANT
STAPLER PROXIMATE 100MM BLUE (MISCELLANEOUS) ×1 IMPLANT
STAPLER PROXIMATE 75MM BLUE (STAPLE) ×1 IMPLANT
STAPLER RELOAD LINEAR PROX 30 (STAPLE) ×2
STAPLER RELOADABLE 30 BLU REG (STAPLE) IMPLANT
STAPLER VISISTAT (STAPLE) ×1 IMPLANT
SUCTION POOLE HANDLE (INSTRUMENTS) ×2
SUT PDS AB 0 CTX 60 (SUTURE) ×2 IMPLANT
SUT PDS AB CT VIOLET #0 27IN (SUTURE) ×3 IMPLANT
SUT SILK 3 0 SH CR/8 (SUTURE) ×3 IMPLANT
SYR 20ML LL LF (SYRINGE) ×2 IMPLANT
TOWEL OR 17X26 4PK STRL BLUE (TOWEL DISPOSABLE) ×2 IMPLANT
TOWEL ~~LOC~~+RFID 17X26 BLUE (SPONGE) ×2 IMPLANT
TRAY FOLEY SLVR 16FR LF STAT (SET/KITS/TRAYS/PACK) ×2 IMPLANT

## 2021-11-03 NOTE — [facility] Notes (Signed)
Circle and type and screen order by Dr. Arnoldo Morale, Discontinued Dilaudid, Patient alert , wife at bedside feeding him ice chips . VS improving .    11/03/21 1707  Vitals  Temp 99.3 F (37.4 C)  Temp Source Oral  BP (!) 90/57  MAP (mmHg) 68  BP Location Left Arm  BP Method Automatic  Patient Position (if appropriate) Lying  Pulse Rate 98  Pulse Rate Source Monitor  Level of Consciousness  Level of Consciousness Alert  MEWS COLOR  MEWS Score Color Green  Oxygen Therapy  SpO2 99 %  O2 Device Room Air  MEWS Score  MEWS Temp 0  MEWS Systolic 1  MEWS Pulse 0  MEWS RR 0  MEWS LOC 0  MEWS Score 1

## 2021-11-03 NOTE — [facility] Notes (Addendum)
RT went in to see patient to teach the IS. Patient was able to perform x3 and then complained of being nauseous. We stopped the IS, his color changed and he became diaphoretic. RT checked patients BP and called for RN.Rapid response was called due to hypotension. RT stayed at the bedside until patient's BP became acceptable.

## 2021-11-03 NOTE — [facility] Notes (Signed)
Removed foley , patient tolerated well and was able to void 600cc into urinal

## 2021-11-03 NOTE — Op Note (Addendum)
Patient:  Steven Martinez  DOB:  05-May-1991  MRN:  [id number]   Preop Diagnosis: Gastric carcinoma  Postop Diagnosis: Same  Procedure: Partial gastrectomy with Roux-en-Y reconstruction  Surgeon: Aviva Signs, MD  Assistant: Curlene Labrum, MD  Anes: General endotracheal  Indications: Patient is a 31 year old Hispanic male with a known history of gastric carcinoma just proximal to the pylorus who has undergone neoadjuvant chemotherapy and now presents for a partial gastrectomy.  The risks and benefits of the procedure including bleeding, infection, anastomotic leak, and the possibility of a blood transfusion were fully explained to the patient, who gave informed consent.  Procedure note: The patient was placed in the supine position.  After induction of general endotracheal anesthesia, the abdomen was prepped and draped using the usual sterile technique with ChloraPrep.  Surgical site confirmation was performed.  An upper midline incision was made down to the umbilicus from the subxiphoid process.  Peritoneal cavity was entered into without difficulty.  The liver was palpated and no abnormal lesions were noted.  At approximately the mid portion of the body of the stomach, I did excise the gastro colic ligament up to the pylorus.  This was likewise done along the lesser curve.  The duodenum was somewhat folded on itself due to adhesions to the gallbladder.  These were freed away.  I was able to free up the pylorus and a TA 30 was placed across the first portion of the duodenum and fired.  Care was taken to avoid the pancreas.  The remaining lesser sac mesentery and gastro epiploic area was freed away using the LigaSure.  I then used a TA 100 stapler across the midportion of the body of the stomach and fired it.  The stomach was then removed and sent to pathology for further examination.  I did try to include multiple lymph nodes along the left gastric and gastro epiploic.  Some of the lymph  nodes were also removed from the hepatic region.  At approximately 60-80cm from the ligament of Treitz the small bowel was divided using a GIA 75 stapler.  The Roux limb was then brought up in an antecolic manner to the stomach.  Using a GIA 75 stapler, both the small bowel and stomach along the greater curvature were opened in a posterior gastro enterostomy was performed.  The enterotomy was closed using a TA 60 stapler.  The staple line was bolstered in multiple areas using 3-0 silk sutures.  To complete the Roux-en-Y reconstruction, the affernet and efferent limbs of the small bowel were brought back together in a side-to-side manner using a GIA 75 stapler.  The enterotomy was closed using a TA 60 stapler.  The staple line was bolstered using 3-0 silk Lembert sutures.  The NG tube was noted to be in appropriate position in the stomach.  The duodenal stump was inspected and no leakage or bleeding was noted.  Surgicel was placed in this region over the anastomosis.  The upper abdominal cavity was copiously irrigated with normal saline.  All fluid was evacuated.  The fascia was reapproximated using a looped 0 PDS running suture.  The subcutaneous layer was irrigated with normal saline.  Exparel was instilled into the surrounding wound.  The skin was closed using staples.  Betadine ointment and a dry sterile dressing were applied.  All tape and needle counts were correct at the end of the procedure.  The patient was extubated in the operating room and transferred to PACU in stable condition.  Complications:  None  EBL: 50 cc  Specimen: Distal stomach  Addendum:  Dr. Constance Haw assisted throughout the procedure and provided a critical role in assisting with resection, dissection, and anastomosis.  No RNFA was available.

## 2021-11-03 NOTE — Transfer of Care (Signed)
Immediate Anesthesia Transfer of Care Note  Patient: Steven Martinez  Procedure(s) Performed: PARTIAL GASTRECTOMY (Abdomen)  Patient Location: PACU  Anesthesia Type:General  Level of Consciousness: sedated and patient cooperative  Airway & Oxygen Therapy: Patient Spontanous Breathing and Patient connected to nasal cannula oxygen  Post-op Assessment: Report given to RN and Post -op Vital signs reviewed and stable  Post vital signs: Reviewed and stable  Last Vitals:  Vitals Value Taken Time  BP 140/90 11/03/21 1055  Temp    Pulse 85 11/03/21 1057  Resp 20 11/03/21 1057  SpO2 100 % 11/03/21 1057  Vitals shown include unvalidated device data.  Last Pain:  Vitals:   11/03/21 0845  TempSrc: Oral  PainSc: 0-No pain         Complications: No notable events documented.

## 2021-11-03 NOTE — Progress Notes (Signed)
Patient noted to not have any urine output post bolus , bladder scanned patient revealed 710cc in urinary bladder., Notified Dr. Arnoldo Morale

## 2021-11-03 NOTE — Progress Notes (Signed)
Patient working with RT doing incentive spirometer and his BP dropped   BS 178 Dr. Arnoldo Morale paged    11/03/21 1510  Vitals  BP (!) 78/33 (pt became diaphoretic during incentive spirometery with RT & dropped BP)  MEWS COLOR  MEWS Score Color Yellow  MEWS Score  MEWS Temp 0  MEWS Systolic 2  MEWS Pulse 0  MEWS RR 0  MEWS LOC 0  MEWS Score 2

## 2021-11-03 NOTE — Progress Notes (Signed)
Checked pt BP at start of my shift. BP low, pt c/o of dizziness, nausea & pain. Pt was given medication and MD was paged regarding soft pressure. 1L Bolus was ordered. Surgical MD called this RN to f/up on patient after bolus. MD made aware of stable BP readings. Adv MD that patient was in pain once again. MD adv fentanyl is ordered for patient. Reported off to oncoming RN patient status.

## 2021-11-03 NOTE — Anesthesia Postprocedure Evaluation (Signed)
Anesthesia Post Note  Patient: Steven Martinez  Procedure(s) Performed: PARTIAL GASTRECTOMY (Abdomen)  Patient location during evaluation: Phase II Anesthesia Type: General Level of consciousness: awake Pain management: pain level controlled Vital Signs Assessment: post-procedure vital signs reviewed and stable Respiratory status: spontaneous breathing and respiratory function stable Cardiovascular status: blood pressure returned to baseline and stable Postop Assessment: no headache and no apparent nausea or vomiting Anesthetic complications: no Comments: Late entry   No notable events documented.   Last Vitals:  Vitals:   11/03/21 1230 11/03/21 1256  BP: 130/87 93/63  Pulse: 89 88  Resp: 20 20  Temp: 36.7 C (!) 36.4 C  SpO2: 99% 96%    Last Pain:  Vitals:   11/03/21 1300  TempSrc:   PainSc: 0-No pain                 Louann Sjogren

## 2021-11-03 NOTE — Anesthesia Preprocedure Evaluation (Signed)
Anesthesia Evaluation  Patient identified by MRN, date of birth, ID band Patient awake    Reviewed: Allergy & Precautions, H&P , NPO status , Patient's Chart, lab work & pertinent test results, reviewed documented beta blocker date and time   Airway Mallampati: II  TM Distance: >3 FB Neck ROM: full    Dental no notable dental hx.    Pulmonary neg pulmonary ROS,    Pulmonary exam normal breath sounds clear to auscultation       Cardiovascular Exercise Tolerance: Good negative cardio ROS   Rhythm:regular Rate:Normal     Neuro/Psych negative neurological ROS  negative psych ROS   GI/Hepatic Neg liver ROS, PUD, GERD  Medicated,  Endo/Other  negative endocrine ROS  Renal/GU negative Renal ROS  negative genitourinary   Musculoskeletal   Abdominal   Peds  Hematology  (+) Blood dyscrasia, anemia ,   Anesthesia Other Findings   Reproductive/Obstetrics negative OB ROS                             Anesthesia Physical Anesthesia Plan  ASA: 2  Anesthesia Plan: General and General ETT   Post-op Pain Management:    Induction:   PONV Risk Score and Plan: Ondansetron  Airway Management Planned:   Additional Equipment:   Intra-op Plan:   Post-operative Plan:   Informed Consent: I have reviewed the patients History and Physical, chart, labs and discussed the procedure including the risks, benefits and alternatives for the proposed anesthesia with the patient or authorized representative who has indicated his/her understanding and acceptance.     Dental Advisory Given  Plan Discussed with: CRNA  Anesthesia Plan Comments:         Anesthesia Quick Evaluation

## 2021-11-03 NOTE — Progress Notes (Signed)
°   11/03/21 1949  Assess: MEWS Score  Temp 100.1 F (37.8 C)  BP (!) 73/43  Pulse Rate (!) 117  Level of Consciousness Alert  SpO2 98 %  Assess: MEWS Score  MEWS Temp 0  MEWS Systolic 2  MEWS Pulse 2  MEWS RR 0  MEWS LOC 0  MEWS Score 4  MEWS Score Color Red  Assess: if the MEWS score is Yellow or Red  Were vital signs taken at a resting state? Yes  Focused Assessment  (initial shift assessment for RN)  MEWS guidelines implemented *See Row Information* Yes  Treat  MEWS Interventions Administered prn meds/treatments  Pain Scale 0-10  Pain Score 5  Pain Type Surgical pain  Pain Location Abdomen  Escalate  MEWS: Escalate Red: discuss with charge nurse/RN and provider, consider discussing with RRT  Notify: Charge Nurse/RN  Name of Charge Nurse/RN Notified Abram Sander, RN  Date Charge Nurse/RN Notified 11/03/21  Time Charge Nurse/RN Notified 2000  Notify: Provider  Provider Name/Title Pappayliou  Date Provider Notified 11/03/21  Time Provider Notified 1953  Notification Type Page  Notification Reason Other (Comment);Change in status (LOW BP)  Provider response See new orders  Date of Provider Response 11/03/21  Time of Provider Response 2035

## 2021-11-03 NOTE — Interval H&P Note (Signed)
History and Physical Interval Note:  11/03/2021 8:44 AM  Steven Martinez  has presented today for surgery, with the diagnosis of GASTRIC CANCER.  The various methods of treatment have been discussed with the patient and family. After consideration of risks, benefits and other options for treatment, the patient has consented to  Procedure(s): PARTIAL GASTRECTOMY (N/A) as a surgical intervention.  The patient's history has been reviewed, patient examined, no change in status, stable for surgery.  I have reviewed the patient's chart and labs.  Questions were answered to the patient's satisfaction.     Aviva Signs

## 2021-11-03 NOTE — Anesthesia Procedure Notes (Signed)
Procedure Name: Intubation Date/Time: 11/03/2021 9:07 AM Performed by: Myna Bright, CRNA Pre-anesthesia Checklist: Patient identified, Emergency Drugs available, Suction available and Patient being monitored Patient Re-evaluated:Patient Re-evaluated prior to induction Oxygen Delivery Method: Circle system utilized Preoxygenation: Pre-oxygenation with 100% oxygen Induction Type: IV induction Ventilation: Mask ventilation without difficulty Laryngoscope Size: Mac and 4 Grade View: Grade I Tube type: Oral Tube size: 7.5 mm Number of attempts: 1 Airway Equipment and Method: Stylet Placement Confirmation: ETT inserted through vocal cords under direct vision, positive ETCO2 and breath sounds checked- equal and bilateral Secured at: 22 cm Tube secured with: Tape Dental Injury: Teeth and Oropharynx as per pre-operative assessment

## 2021-11-03 NOTE — Progress Notes (Signed)
Post bolus patients BP remains soft    11/03/21 1639  Vitals  BP (!) 84/61

## 2021-11-04 ENCOUNTER — Encounter (HOSPITAL_COMMUNITY): Payer: Self-pay | Admitting: General Surgery

## 2021-11-04 LAB — BASIC METABOLIC PANEL
Anion gap: 14 (ref 5–15)
Anion gap: 13 (ref 5–15)
BUN: 35 mg/dL — ABNORMAL HIGH (ref 6–20)
BUN: 41 mg/dL — ABNORMAL HIGH (ref 6–20)
CO2: 16 mmol/L — ABNORMAL LOW (ref 22–32)
CO2: 18 mmol/L — ABNORMAL LOW (ref 22–32)
Calcium: 8.2 mg/dL — ABNORMAL LOW (ref 8.9–10.3)
Calcium: 8.1 mg/dL — ABNORMAL LOW (ref 8.9–10.3)
Chloride: 109 mmol/L (ref 98–111)
Chloride: 109 mmol/L (ref 98–111)
Creatinine, Ser: 1.42 mg/dL — ABNORMAL HIGH (ref 0.61–1.24)
Creatinine, Ser: 1.38 mg/dL — ABNORMAL HIGH (ref 0.61–1.24)
GFR, Estimated: >60 mL/min (ref >60)
GFR, Estimated: >60 mL/min (ref >60)
Glucose, Bld: 158 mg/dL — ABNORMAL HIGH (ref 70–99)
Glucose, Bld: 146 mg/dL — ABNORMAL HIGH (ref 70–99)
Potassium: 5.4 mmol/L — ABNORMAL HIGH (ref 3.5–5.1)
Potassium: 5 mmol/L (ref 3.5–5.1)
Sodium: 139 mmol/L (ref 135–145)
Sodium: 140 mmol/L (ref 135–145)

## 2021-11-04 LAB — TYPE AND SCREEN: ABO/RH(D): O POS

## 2021-11-04 LAB — CBC
HCT: 26.6 % — ABNORMAL LOW (ref 39.0–52.0)
Hemoglobin: 8.5 g/dL — ABNORMAL LOW (ref 13.0–17.0)
MCH: 28.3 pg (ref 26.0–34.0)
MCHC: 32 g/dL (ref 30.0–36.0)
MCV: 88.7 fL (ref 80.0–100.0)
Platelets: 199 10*3/uL (ref 150–400)
RBC: 3 MIL/uL — ABNORMAL LOW (ref 4.22–5.81)
RDW: 16.4 % — ABNORMAL HIGH (ref 11.5–15.5)
WBC: 9.4 10*3/uL (ref 4.0–10.5)
nRBC: 0 % (ref 0.0–0.2)

## 2021-11-04 LAB — PHOSPHORUS: Phosphorus: 5.1 mg/dL — ABNORMAL HIGH (ref 2.5–4.6)

## 2021-11-04 LAB — PREPARE RBC (CROSSMATCH)

## 2021-11-04 LAB — MAGNESIUM: Magnesium: 1.6 mg/dL — ABNORMAL LOW (ref 1.7–2.4)

## 2021-11-04 LAB — HEMOGLOBIN AND HEMATOCRIT, BLOOD
HCT: 24.8 % — ABNORMAL LOW (ref 39.0–52.0)
Hemoglobin: 7.9 g/dL — ABNORMAL LOW (ref 13.0–17.0)

## 2021-11-04 MED ORDER — SODIUM CHLORIDE 0.9% IV SOLUTION: Freq: Once | INTRAVENOUS | Status: DC

## 2021-11-04 MED ORDER — SODIUM CHLORIDE 0.9 % IV BOLUS
500.0000 mL | Freq: Once | INTRAVENOUS | Status: AC
  Administered 2021-11-04: 500 mL via INTRAVENOUS

## 2021-11-04 NOTE — Progress Notes (Signed)
°  Transition of Care (TOC) Screening Note   Patient Details  Name: Steven Martinez Date of Birth: 1991-05-03   Transition of Care [facility]) CM/SW Contact:    Iona Beard, [place] Phone Number: 11/04/2021, 11:48 AM    Transition of Care Department [facility]) has reviewed patient and no TOC needs have been identified at this time. We will continue to monitor patient advancement through interdisciplinary progression rounds. If new patient transition needs arise, please place a TOC consult.

## 2021-11-04 NOTE — Progress Notes (Signed)
°   11/04/21 0002  Assess: MEWS Score  Temp 99.9 F (37.7 C)  BP 95/64  Pulse Rate (!) 130  Resp 18  Level of Consciousness Alert  SpO2 97 %  O2 Device Room Air  Assess: MEWS Score  MEWS Temp 0  MEWS Systolic 1  MEWS Pulse 3  MEWS RR 0  MEWS LOC 0  MEWS Score 4  MEWS Score Color Red  Assess: if the MEWS score is Yellow or Red  Were vital signs taken at a resting state? Yes  Focused Assessment No change from prior assessment  Early Detection of Sepsis Score *See Row Information* Low  MEWS guidelines implemented *See Row Information* Yes  Treat  MEWS Interventions Escalated (See documentation below)  Pain Scale 0-10  Pain Score 4  Pain Type Surgical pain  Pain Location Abdomen  Pain Orientation Mid  Pain Onset On-going  Pain Intervention(s) Repositioned  Multiple Pain Sites No  Take Vital Signs  Increase Vital Sign Frequency  Red: Q 1hr X 4 then Q 4hr X 4, if remains red, continue Q 4hrs  Escalate  MEWS: Escalate Red: discuss with charge nurse/RN and provider, consider discussing with RRT  Notify: Charge Nurse/RN  Name of Charge Nurse/RN Notified Abram Sander, RN  Date Charge Nurse/RN Notified 11/04/21  Time Charge Nurse/RN Notified 0010

## 2021-11-04 NOTE — Progress Notes (Signed)
1 Day Post-Op  Subjective: Patient feels anxious and fatigued.  Denies any shortness of breath.  Has moderate incisional pain.  Objective: Vital signs in last 24 hours: Temp:  [97.5 F (36.4 C)-100.3 F (37.9 C)] 98.2 F (36.8 C) (02/14 0621) Pulse Rate:  [85-131] 117 (02/14 0621) Resp:  [15-25] 18 (02/14 0621) BP: (69-147)/(33-108) 101/72 (02/14 0621) SpO2:  [96 %-100 %] 97 % (02/14 0621) Last BM Date : 11/02/21  Intake/Output from previous day: 02/13 0701 - 02/14 0700 In: 3257.2 [I.V.:2146.3; IV Piggyback:1111] Out: 1630 [Urine:1230; Emesis/NG output:350; Blood:50] Intake/Output this shift: Total I/O In: -  Out: 125 [Urine:125]  General appearance: alert, cooperative, and fatigued Resp: clear to auscultation bilaterally Cardio: Tachycardic without S3, S4, murmurs GI: Soft, nondistended.  Incision healing well.  Lab Results:  Recent Labs    11/03/21 1809 11/04/21 0522  WBC  --  9.4  HGB 9.5* 8.5*  HCT 29.7* 26.6*  PLT  --  199   BMET Recent Labs    11/04/21 0522  NA 139  K 5.4*  CL 109  CO2 16*  GLUCOSE 158*  BUN 35*  CREATININE 1.42*  CALCIUM 8.2*   PT/INR No results for input(s): LABPROT, INR in the last 72 hours.  Studies/Results: No results found.  Anti-infectives: Anti-infectives (From admission, onward)    Start     Dose/Rate Route Frequency Ordered Stop   11/03/21 0900  ceFAZolin (ANCEF) IVPB 2g/100 mL premix        2 g 200 mL/hr over 30 Minutes Intravenous On call to O.R. 11/03/21 6415 11/03/21 0918   11/03/21 0847  ceFAZolin (ANCEF) 2-4 GM/100ML-% IVPB       Note to Pharmacy: Martinez, Steven L: cabinet override      11/03/21 0847 11/03/21 0918       Assessment/Plan: s/p Procedure(s): PARTIAL GASTRECTOMY Impression: Postoperative day 1.  Patient seems to have a significant amount of fluid shifts with acute surgical blood loss that seems to be stabilizing.  He does have renal insufficiency most likely secondary to hypotension.  His  blood pressure has been running soft since his surgery.  He did have difficulties with the Foley being obstructed, thus the Foley was removed.  He did have bloody NG tube output, but that has stopped.  It was elected to remove the NG tube.  His hemoglobin and renal function will be checked later today.  I did talk to him and his wife concerning his condition.  We will continue to monitor.  Final pathology pending.  LOS: 1 day    Aviva Signs 11/04/2021

## 2021-11-04 NOTE — Progress Notes (Signed)
Over night, this RN started the shift at 11 pm. Patient's blood pressure was soft, systolic remaining in the 90's all shift. Patient voided 380 mL of clear, yellow urine since 11 pm. Pain was controlled with PRN medications. Axillary temp of 100.3 treated once with Tylenol. NG tube suctioned 250 mL red blood this shift.

## 2021-11-05 LAB — COMPREHENSIVE METABOLIC PANEL
ALT: 20 U/L (ref 0–44)
AST: 23 U/L (ref 15–41)
Albumin: 2.9 g/dL — ABNORMAL LOW (ref 3.5–5.0)
Alkaline Phosphatase: 45 U/L (ref 38–126)
Anion gap: 9 (ref 5–15)
BUN: 32 mg/dL — ABNORMAL HIGH (ref 6–20)
CO2: 21 mmol/L — ABNORMAL LOW (ref 22–32)
Calcium: 8.2 mg/dL — ABNORMAL LOW (ref 8.9–10.3)
Chloride: 111 mmol/L (ref 98–111)
Creatinine, Ser: 0.91 mg/dL (ref 0.61–1.24)
GFR, Estimated: >60 mL/min (ref >60)
Glucose, Bld: 115 mg/dL — ABNORMAL HIGH (ref 70–99)
Potassium: 4.2 mmol/L (ref 3.5–5.1)
Sodium: 141 mmol/L (ref 135–145)
Total Bilirubin: 0.4 mg/dL (ref 0.3–1.2)
Total Protein: 5.2 g/dL — ABNORMAL LOW (ref 6.5–8.1)

## 2021-11-05 LAB — CBC
HCT: 22.5 % — ABNORMAL LOW (ref 39.0–52.0)
Hemoglobin: 6.9 g/dL — CL (ref 13.0–17.0)
MCH: 27.9 pg (ref 26.0–34.0)
MCHC: 30.7 g/dL (ref 30.0–36.0)
MCV: 91.1 fL (ref 80.0–100.0)
Platelets: 136 10*3/uL — ABNORMAL LOW (ref 150–400)
RBC: 2.47 MIL/uL — ABNORMAL LOW (ref 4.22–5.81)
RDW: 15.9 % — ABNORMAL HIGH (ref 11.5–15.5)
WBC: 8.1 10*3/uL (ref 4.0–10.5)
nRBC: 0 % (ref 0.0–0.2)

## 2021-11-05 LAB — HEMOGLOBIN AND HEMATOCRIT, BLOOD
HCT: 28.2 % — ABNORMAL LOW (ref 39.0–52.0)
Hemoglobin: 9.4 g/dL — ABNORMAL LOW (ref 13.0–17.0)

## 2021-11-05 LAB — MAGNESIUM: Magnesium: 2.1 mg/dL (ref 1.7–2.4)

## 2021-11-05 LAB — PHOSPHORUS: Phosphorus: 3 mg/dL (ref 2.5–4.6)

## 2021-11-05 LAB — PREPARE RBC (CROSSMATCH)

## 2021-11-05 MED ORDER — SODIUM CHLORIDE 0.9% IV SOLUTION: Freq: Once | INTRAVENOUS | Status: AC

## 2021-11-05 NOTE — Progress Notes (Signed)
°   11/05/21 1015  Vitals  Temp 99.6 F (37.6 C)  Temp Source Oral  BP 114/79  Pulse Rate (!) 114  Resp (!) 26  MEWS COLOR  MEWS Score Color Red  Oxygen Therapy  SpO2 95 %  O2 Device Room Air  Patient Activity (if Appropriate) In bed  Pain Assessment  Pain Scale 0-10  Pain Score 7  MEWS Score  MEWS Temp 0  MEWS Systolic 0  MEWS Pulse 2  MEWS RR 2  MEWS LOC 0  MEWS Score 4  Provider Notification  Provider Name/Title Aviva Signs  Date Provider Notified 11/05/21  Time Provider Notified 1033  Notification Type Page  Notification Reason Other (Comment) (RED MEWS)  Provider response No new orders

## 2021-11-05 NOTE — Progress Notes (Addendum)
2 Days Post-Op  Subjective: Feels better.  Less incisional pain noted.  He does not feel as fatigued.  Tolerating clear liquid diet well, though he does have early satiety which is not unexpected.  Objective: Vital signs in last 24 hours: Temp:  [98 F (36.7 C)-99.3 F (37.4 C)] 98 F (36.7 C) (02/15 0514) Pulse Rate:  [86-114] 110 (02/15 0514) Resp:  [17-20] 19 (02/15 0514) BP: (103-127)/(65-81) 106/66 (02/15 0514) SpO2:  [97 %-99 %] 97 % (02/15 0514) Last BM Date : 11/03/21 (per patient)  Intake/Output from previous day: 02/14 0701 - 02/15 0700 In: 1499.5 [P.O.:895; Blood:604.5] Out: 1000 [Urine:1000] Intake/Output this shift: No intake/output data recorded.  General appearance: alert, cooperative, and no distress Resp: clear to auscultation bilaterally Cardio: regular rate and rhythm, S1, S2 normal, no murmur, click, rub or gallop GI: Soft, incision healing well.  Occasional bowel sounds appreciated.  Nondistended.  Lab Results:  Recent Labs    11/04/21 0522 11/04/21 1421 11/05/21 0518  WBC 9.4  --  8.1  HGB 8.5* 7.9* 6.9*  HCT 26.6* 24.8* 22.5*  PLT 199  --  136*   BMET Recent Labs    11/04/21 1421 11/05/21 0518  NA 140 141  K 5.0 4.2  CL 109 111  CO2 18* 21*  GLUCOSE 146* 115*  BUN 41* 32*  CREATININE 1.38* 0.91  CALCIUM 8.1* 8.2*   PT/INR No results for input(s): LABPROT, INR in the last 72 hours.  Studies/Results: No results found.  Anti-infectives: Anti-infectives (From admission, onward)    Start     Dose/Rate Route Frequency Ordered Stop   11/03/21 0900  ceFAZolin (ANCEF) IVPB 2g/100 mL premix        2 g 200 mL/hr over 30 Minutes Intravenous On call to O.R. 11/03/21 9390 11/03/21 0918   11/03/21 0847  ceFAZolin (ANCEF) 2-4 GM/100ML-% IVPB       Note to Pharmacy: Moore, [place] L: cabinet override      11/03/21 0847 11/03/21 0918       Assessment/Plan: s/p Procedure(s): PARTIAL GASTRECTOMY Impression: Postoperative day 2.  His vital  signs are more stable.  His renal function has improved.  Patient did have acute kidney injury secondary to hypotension, volume depletion from surgery.  His bicarb has improved.  He did receive 1 unit of packed red blood cells yesterday but still has a hemoglobin of 6.9 this morning.  He will get 2 units of packed red blood cells.  This is acute surgical blood loss, but I think it is starting to resolve as he has not had any significant hematemesis or hematochezia.  His wife states that he had significant anemia during his chemotherapy that required shots to try to stimulate bone marrow production.  We will continue to monitor closely.  Final pathology pending.  Advance to full liquid diet.  LOS: 2 days    Aviva Signs 11/05/2021

## 2021-11-05 NOTE — Progress Notes (Signed)
2 units or PRBC was administered to pt. Pt had no complaints other than occasional pain. Pain medication has been administered. Wife is at bedside and is able to translate for pt.

## 2021-11-05 NOTE — Progress Notes (Signed)
°   11/05/21 0857  Assess: MEWS Score  Temp 99.5 F (37.5 C)  BP 125/77  Pulse Rate (!) 114  Resp (!) 28  SpO2 96 %  O2 Device Room Air  Patient Activity (if Appropriate) In bed  Assess: MEWS Score  MEWS Temp 0  MEWS Systolic 0  MEWS Pulse 2  MEWS RR 2  MEWS LOC 0  MEWS Score 4  MEWS Score Color Red  Assess: if the MEWS score is Yellow or Red  Were vital signs taken at a resting state? Yes  Focused Assessment No change from prior assessment  Early Detection of Sepsis Score *See Row Information* Low  MEWS guidelines implemented *See Row Information* Yes  Treat  MEWS Interventions Administered scheduled meds/treatments  Pain Scale 0-10  Pain Score 7  Pain Type Surgical pain  Take Vital Signs  Increase Vital Sign Frequency  Red: Q 1hr X 4 then Q 4hr X 4, if remains red, continue Q 4hrs  Escalate  MEWS: Escalate Red: discuss with charge nurse/RN and provider, consider discussing with RRT  Notify: Charge Nurse/RN  Name of Charge Nurse/RN Notified Juanell Fairly, RN  Date Charge Nurse/RN Notified 11/05/21  Time Charge Nurse/RN Notified 0930  Notify: Rapid Response  Name of Rapid Response RN Notified n/a

## 2021-11-05 NOTE — Progress Notes (Signed)
Oncology Discharge Planning Admission Note  [place] at [facility] Address: [address], [place] [address] Hours of Operation:  8am - 5pm, Monday - Friday  Clinic Contact Information:  [phone]  Oncology Care Team: Medical Oncologist:  Derek Jack  Dr. Delton Coombes is aware of this hospital admission dated 11/03/21, and the cancer center will follow [facility] inpatient care to assist with discharge planning as indicated by the oncologist.  We will arrange for follow up closer to discharge from the hospital.  Disclaimer:  This [facility] note does not imply a formal consult request has been made by the admitting attending for this admission or there will be an inpatient consult completed by oncology.  Please request oncology consults as per standard process as indicated.

## 2021-11-05 NOTE — Progress Notes (Addendum)
Date and time results received: 11/05/21 07:26 (use smartphrase ".now" to insert current time)  Test: HGB Critical Value: 6.9  Name of Provider Notified: Aviva Signs, MD  Orders Received? Or Actions Taken?: two units of PRBC ordered

## 2021-11-06 LAB — TYPE AND SCREEN
ABO/RH(D): O POS
Antibody Screen: NEGATIVE
Unit division: 0
Unit division: 0
Unit division: 0

## 2021-11-06 LAB — BPAM RBC
Blood Product Expiration Date: 202303172359
Blood Product Expiration Date: 202303172359
Blood Product Expiration Date: 202303172359
ISSUE DATE / TIME: 202302141814
ISSUE DATE / TIME: 202302150915
ISSUE DATE / TIME: 202302151204
Unit Type and Rh: 5100
Unit Type and Rh: 5100
Unit Type and Rh: 5100

## 2021-11-06 LAB — BASIC METABOLIC PANEL
Anion gap: 9 (ref 5–15)
BUN: 21 mg/dL — ABNORMAL HIGH (ref 6–20)
CO2: 24 mmol/L (ref 22–32)
Calcium: 8 mg/dL — ABNORMAL LOW (ref 8.9–10.3)
Chloride: 107 mmol/L (ref 98–111)
Creatinine, Ser: 0.77 mg/dL (ref 0.61–1.24)
GFR, Estimated: >60 mL/min (ref >60)
Glucose, Bld: 94 mg/dL (ref 70–99)
Potassium: 3.8 mmol/L (ref 3.5–5.1)
Sodium: 140 mmol/L (ref 135–145)

## 2021-11-06 LAB — CBC
HCT: 27 % — ABNORMAL LOW (ref 39.0–52.0)
Hemoglobin: 8.6 g/dL — ABNORMAL LOW (ref 13.0–17.0)
MCH: 28.9 pg (ref 26.0–34.0)
MCHC: 31.9 g/dL (ref 30.0–36.0)
MCV: 90.6 fL (ref 80.0–100.0)
Platelets: 122 10*3/uL — ABNORMAL LOW (ref 150–400)
RBC: 2.98 MIL/uL — ABNORMAL LOW (ref 4.22–5.81)
RDW: 14.7 % (ref 11.5–15.5)
WBC: 6.2 10*3/uL (ref 4.0–10.5)
nRBC: 0 % (ref 0.0–0.2)

## 2021-11-06 MED ORDER — POLYETHYLENE GLYCOL 3350 17 G PO PACK: 17.0000 g | PACK | Freq: Every day | ORAL | Status: DC

## 2021-11-06 MED ORDER — KCL IN DEXTROSE-NACL 20-5-0.45 MEQ/L-%-% IV SOLN: INTRAVENOUS | Status: DC

## 2021-11-06 NOTE — Progress Notes (Signed)
Patient complains of no urge to void, but does get up and void out of habit. Ordered post void residual and as needed bladder scan. Also complains of constipation - defer to gen surg.

## 2021-11-06 NOTE — Progress Notes (Signed)
Patient has some new onset of swelling/firmness in upper thigh area. No pain with palpation. At this time he has normal saline running at 100 ml/hr. he is up and moving around the room and to the bathroom with standby assist. SCDs when in bed. I have asked him to use the urinal for the time being so I can monitor any output. He also has a complaint of not feeling any urges to urinate. He states out of habit he gets up and goes to the bathroom and does urinate, but just has not had an urge. Patient has been here since 2/13 and has not had a bowel movement. Notified MD. See note she has added.

## 2021-11-06 NOTE — Progress Notes (Signed)
3 Days Post-Op  Subjective: Patient feels much better today.  Tolerating full liquid diet well.  Is passing flatus but has had no bowel movement.  Objective: Vital signs in last 24 hours: Temp:  [98.1 F (36.7 C)-100.4 F (38 C)] 100.4 F (38 C) (02/16 0449) Pulse Rate:  [93-114] 93 (02/16 0449) Resp:  [19-28] 20 (02/16 0449) BP: (114-138)/(75-95) 117/89 (02/16 0449) SpO2:  [94 %-97 %] 94 % (02/16 0449) Last BM Date : 11/03/21  Intake/Output from previous day: 02/15 0701 - 02/16 0700 In: 4469.5 [P.O.:1560; I.V.:2072.9; Blood:836.7] Out: -  Intake/Output this shift: No intake/output data recorded.  General appearance: alert, cooperative, and no distress Resp: clear to auscultation bilaterally Cardio: regular rate and rhythm, S1, S2 normal, no murmur, click, rub or gallop GI: Soft, bowel sounds present.  Incision healing well.  Nondistended.  Lab Results:  Recent Labs    11/05/21 0518 11/05/21 1549 11/06/21 0434  WBC 8.1  --  6.2  HGB 6.9* 9.4* 8.6*  HCT 22.5* 28.2* 27.0*  PLT 136*  --  122*   BMET Recent Labs    11/05/21 0518 11/06/21 0434  NA 141 140  K 4.2 3.8  CL 111 107  CO2 21* 24  GLUCOSE 115* 94  BUN 32* 21*  CREATININE 0.91 0.77  CALCIUM 8.2* 8.0*   PT/INR No results for input(s): LABPROT, INR in the last 72 hours.  Studies/Results: No results found.  Anti-infectives: Anti-infectives (From admission, onward)    Start     Dose/Rate Route Frequency Ordered Stop   11/03/21 0900  ceFAZolin (ANCEF) IVPB 2g/100 mL premix        2 g 200 mL/hr over 30 Minutes Intravenous On call to O.R. 11/03/21 2263 11/03/21 0918   11/03/21 0847  ceFAZolin (ANCEF) 2-4 GM/100ML-% IVPB       Note to Pharmacy: Moore, [place] L: cabinet override      11/03/21 0847 11/03/21 0918       Assessment/Plan: s/p Procedure(s): PARTIAL GASTRECTOMY Impression: Postoperative day 3.  Hemoglobin responded appropriately to transfusion.  Patient feels much better.  Vital signs  stable.  Tolerating full liquid diet.  Will advance diet once patient has bowel movement.  Final pathology pending.  LOS: 3 days    Aviva Signs 11/06/2021

## 2021-11-07 LAB — BASIC METABOLIC PANEL
Anion gap: 8 (ref 5–15)
BUN: 12 mg/dL (ref 6–20)
CO2: 23 mmol/L (ref 22–32)
Calcium: 8.1 mg/dL — ABNORMAL LOW (ref 8.9–10.3)
Chloride: 106 mmol/L (ref 98–111)
Creatinine, Ser: 0.58 mg/dL — ABNORMAL LOW (ref 0.61–1.24)
GFR, Estimated: >60 mL/min (ref >60)
Glucose, Bld: 92 mg/dL (ref 70–99)
Potassium: 3.5 mmol/L (ref 3.5–5.1)
Sodium: 137 mmol/L (ref 135–145)

## 2021-11-07 LAB — CBC
HCT: 27.5 % — ABNORMAL LOW (ref 39.0–52.0)
Hemoglobin: 8.6 g/dL — ABNORMAL LOW (ref 13.0–17.0)
MCH: 28.3 pg (ref 26.0–34.0)
MCHC: 31.3 g/dL (ref 30.0–36.0)
MCV: 90.5 fL (ref 80.0–100.0)
Platelets: 141 10*3/uL — ABNORMAL LOW (ref 150–400)
RBC: 3.04 MIL/uL — ABNORMAL LOW (ref 4.22–5.81)
RDW: 13.8 % (ref 11.5–15.5)
WBC: 4.9 10*3/uL (ref 4.0–10.5)
nRBC: 0 % (ref 0.0–0.2)

## 2021-11-07 LAB — SURGICAL PATHOLOGY

## 2021-11-07 MED ORDER — PANTOPRAZOLE SODIUM 40 MG PO TBEC: 40.0000 mg | DELAYED_RELEASE_TABLET | Freq: Every day | ORAL | 3 refills | Status: DC

## 2021-11-07 MED ORDER — OXYCODONE HCL 5 MG PO TABS: 5.0000 mg | ORAL_TABLET | ORAL | 0 refills | Status: DC | PRN

## 2021-11-07 NOTE — Progress Notes (Signed)
Initial Nutrition Assessment  DOCUMENTATION CODES:   Not applicable  INTERVENTION:  - Request updated weight - Encourage adequate PO intake - Recommend continuing Ensure Plus  - Recommend MVI, calcium and Vitamin B12 supplementation given recent gastrectomy with roux-en-y - Provided "High Calorie, High Protein Nutrition Therapy" and "Low Fiber Nutrition Therapy" handout for patient  NUTRITION DIAGNOSIS:   Increased nutrient needs related to cancer and cancer related treatments as evidenced by estimated needs.  GOAL:   Patient will meet greater than or equal to 90% of their needs  MONITOR:   PO intake, Diet advancement, Labs, Weight trends  REASON FOR ASSESSMENT:   Rounds    ASSESSMENT:   Pt admitted for partial gastrectomy with roux-en-y reconstruction. PMH significant for gastric cancer diagnosis 2022 and underwent neoadjuvant chemotherapy.  Pt reports feeling better today. His last round of chemotherapy was 1/12. He reports having a decrease in appetite and taste changes d/t chemo. He has been provided Ensure from Oncology center and has been trying to drink those at home. He typically follows a healthy diet with rice, beans, eggs, meats, vegetables and water. He reports tolerating his current soft diet well. We discussed high calorie, high protein meal options to maximize nutritional intake, including continuing nutritional supplements. We also discussed drinking fluids before and after meals instead of with meals and drinking calorie/protein containing nutritional drinks.  Meal Completion: 25% x 1 recorded meal  Pt reports he used to weigh 170 lbs and is currently down to 150 lbs. Unsure of pt's weight during this admission however, noted pt's weight slowly down trending since November 2022. Most recent weight noted to be 150 lbs.  Medications: IV protonix  Labs: Cr 0.58  NUTRITION - FOCUSED PHYSICAL EXAM: Deferred- nurse preparing pt for d/c  Diet Order:   Diet  Order             DIET SOFT Room service appropriate? Yes; Fluid consistency: Thin  Diet effective now           Diet - low sodium heart healthy                   EDUCATION NEEDS:   Education needs have been addressed  Skin:  Skin Assessment: Skin Integrity Issues: Skin Integrity Issues:: Incisions Incisions: abdomen (closed)  Last BM:  2/13  Height:   Ht Readings from Last 1 Encounters:  10/30/21 5\' 7"  (1.702 m)    Weight:   Wt Readings from Last 1 Encounters:  10/30/21 68 kg   BMI:  There is no height or weight on file to calculate BMI.  Estimated Nutritional Needs:   Kcal:  2200-2400  Protein:  110-125g  Fluid:  >/=2.2L  Clayborne Dana, RDN, LDN Clinical Nutrition

## 2021-11-07 NOTE — Discharge Planning (Signed)
Oncology Discharge Planning Note  [place] at [facility] Address: [address], [place] [address] Hours of Operation:  8am - 5pm, Monday - Friday  Clinic Contact Information:  [phone]  Oncology Care Team: Medical Oncologist:  Dr. Derek Jack  Patient Details: Name:  Steven Martinez, Steven Martinez MRN:   [id number] DOB:   01-08-1991 Reason for Current Admission: S/P partial gastrectomy  Discharge Planning Narrative: Discharge follow-up appointments for oncology are current and available on the AVS and MyChart.   Upon discharge from the hospital, hematology/oncology's post discharge plan of care for the outpatient setting is: 12/02/21 with port flush & lab and ov with Dr. Delton Coombes.   [name] will be called within two business days after discharge to review hematology/oncology's plan of care for full understanding.    Outpatient Oncology Specific Care Only: Oncology appointment transportation needs addressed?:  not applicable Oncology medication management for symptom management addressed?:  not applicable Chemo Alert Card reviewed?:  not applicable Immunotherapy Alert Card reviewed?:  not applicable

## 2021-11-07 NOTE — Discharge Summary (Signed)
Physician Discharge Summary  Patient ID: Steven Martinez MRN: [id number] DOB/AGE: May 02, 1991 31 y.o.  Admit date: 11/03/2021 Discharge date: 11/07/2021  Admission Diagnoses: Gastric carcinoma  Discharge Diagnoses: Same Principal Problem:   S/P partial gastrectomy Anemia secondary to acute surgical blood loss  Discharged Condition: good  Hospital Course: Patient is a 31 year old Hispanic male who was diagnosed with distal gastric carcinoma in 2022 who underwent neoadjuvant chemotherapy and now presents for surgery.  He underwent the partial gastrectomy with Roux-en-Y reconstruction on 11/03/2021.  He tolerated the surgery well.  His postoperative course was remarkable for acute surgical blood loss.  He did require 3 units of packed red blood cells.  He reportedly has suppressed bone marrow and does require injections to stimulate red blood cell production.  His diet was advanced out difficulty once his bowel function returned.  Final pathology is still pending.  His hematocrit has remained stable over the past 48 hours.  He is being discharged home on 11/07/2021 in good and improving condition.  Treatments: surgery: Partial gastrectomy with Roux-en-Y reconstruction on 11/03/2021  Discharge Exam: Blood pressure 119/90, pulse 89, temperature 98 F (36.7 C), resp. rate 18, SpO2 92 %. General appearance: alert, cooperative, and no distress Resp: clear to auscultation bilaterally Cardio: regular rate and rhythm, S1, S2 normal, no murmur, click, rub or gallop GI: Soft, incision healing well.  Bowel sounds active.  Nondistended.  Disposition: Discharge disposition: 01-Home or Self Care       Discharge Instructions     Diet - low sodium heart healthy   Complete by: As directed    Increase activity slowly   Complete by: As directed       Allergies as of 11/07/2021       Reactions   Oxaliplatin Itching        Medication List     STOP taking these medications     HYDROcodone-acetaminophen 5-325 MG tablet Commonly known as: Norco   lidocaine-prilocaine cream Commonly known as: EMLA   ondansetron 4 MG disintegrating tablet Commonly known as: Zofran ODT   sucralfate 1 GM/10ML suspension Commonly known as: Carafate       TAKE these medications    multivitamin with minerals Tabs tablet Take 1 tablet by mouth in the morning.   oxyCODONE 5 MG immediate release tablet Commonly known as: Roxicodone Take 1 tablet (5 mg total) by mouth every 4 (four) hours as needed for severe pain.   pantoprazole 40 MG tablet Commonly known as: PROTONIX Take 1 tablet (40 mg total) by mouth daily. 1 tablet   prochlorperazine 10 MG tablet Commonly known as: COMPAZINE Take 1 tablet (10 mg total) by mouth every 6 (six) hours as needed (Nausea or vomiting).        Follow-up Information     Aviva Signs, MD. Schedule an appointment as soon as possible for a visit on 11/13/2021.   Specialty: General Surgery Contact information: [address] [address] [phone]                 Signed: Aviva Signs 11/07/2021, 1:16 PM

## 2021-11-13 ENCOUNTER — Encounter: Payer: Self-pay | Admitting: General Surgery

## 2021-11-13 ENCOUNTER — Other Ambulatory Visit: Payer: Self-pay

## 2021-11-13 ENCOUNTER — Ambulatory Visit (INDEPENDENT_AMBULATORY_CARE_PROVIDER_SITE_OTHER): Payer: 59 | Admitting: General Surgery

## 2021-11-13 VITALS — BP 128/87 | HR 65 | Temp 97.9°F | Resp 12 | Ht 67.0 in | Wt 141.0 lb

## 2021-11-13 DIAGNOSIS — Z09 Encounter for follow-up examination after completed treatment for conditions other than malignant neoplasm: Secondary | ICD-10-CM

## 2021-11-13 NOTE — Progress Notes (Signed)
Subjective:     Steven Martinez  Patient here for postoperative visit, status post partial gastrectomy with Roux-en-Y reconstruction.  Patient has been doing well.  His bowel movements have returned to normal.  He does have early satiety, which is not unexpected given his surgery.  He denies any fever or chills.  He is not requiring a significant amount of pain medication at the present time. Objective:    BP 128/87    Pulse 65    Temp 97.9 F (36.6 C) (Other (Comment))    Resp 12    Ht 5\' 7"  (1.702 m)    Wt 141 lb (64 kg)    SpO2 98%    BMI 22.08 kg/m   General:  alert, cooperative, and no distress  Abdomen soft, incision healing well.  Staples removed, Steri-Strips applied. Final pathology discussed with the patient and wife already.     Assessment:    Doing well postoperatively.    Plan:   Patient has follow-up with Dr. Delton Coombes on 12/02/2021.  Follow-up here on 12/04/2021 to assess return to work.  May increase activity as able.  I did suggest Ensure supplements to help with nutrition as needed.

## 2021-11-20 ENCOUNTER — Other Ambulatory Visit (HOSPITAL_COMMUNITY): Payer: Self-pay | Admitting: *Deleted

## 2021-12-01 ENCOUNTER — Other Ambulatory Visit (HOSPITAL_COMMUNITY): Payer: Self-pay

## 2021-12-01 DIAGNOSIS — Z903 Acquired absence of stomach [part of]: Secondary | ICD-10-CM

## 2021-12-01 DIAGNOSIS — C163 Malignant neoplasm of pyloric antrum: Secondary | ICD-10-CM

## 2021-12-01 MED ORDER — ADVANCED MULTI EA PO CHEW
2.0000 | CHEWABLE_TABLET | Freq: Every day | ORAL | 6 refills | Status: DC
  Filled 2021-12-01: qty 60, 30d supply, fill #0

## 2021-12-01 NOTE — Telephone Encounter (Signed)
Order placed for bariatric multivitamin per nutrition recommendation and Dr. Delton Coombes verbal order ?

## 2021-12-02 ENCOUNTER — Inpatient Hospital Stay (HOSPITAL_COMMUNITY): Payer: 59 | Attending: Hematology | Admitting: Hematology

## 2021-12-02 ENCOUNTER — Inpatient Hospital Stay (HOSPITAL_COMMUNITY): Payer: 59

## 2021-12-02 ENCOUNTER — Other Ambulatory Visit: Payer: Self-pay

## 2021-12-02 VITALS — Ht 67.0 in | Wt 139.0 lb

## 2021-12-02 DIAGNOSIS — Z95828 Presence of other vascular implants and grafts: Secondary | ICD-10-CM

## 2021-12-02 DIAGNOSIS — R634 Abnormal weight loss: Secondary | ICD-10-CM | POA: Insufficient documentation

## 2021-12-02 DIAGNOSIS — C163 Malignant neoplasm of pyloric antrum: Secondary | ICD-10-CM

## 2021-12-02 DIAGNOSIS — G629 Polyneuropathy, unspecified: Secondary | ICD-10-CM | POA: Diagnosis not present

## 2021-12-02 DIAGNOSIS — C169 Malignant neoplasm of stomach, unspecified: Secondary | ICD-10-CM

## 2021-12-02 DIAGNOSIS — Z87891 Personal history of nicotine dependence: Secondary | ICD-10-CM | POA: Diagnosis not present

## 2021-12-02 DIAGNOSIS — D649 Anemia, unspecified: Secondary | ICD-10-CM | POA: Insufficient documentation

## 2021-12-02 LAB — COMPREHENSIVE METABOLIC PANEL
ALT: 55 U/L — ABNORMAL HIGH (ref 0–44)
AST: 33 U/L (ref 15–41)
Albumin: 4.4 g/dL (ref 3.5–5.0)
Alkaline Phosphatase: 91 U/L (ref 38–126)
Anion gap: 6 (ref 5–15)
BUN: 13 mg/dL (ref 6–20)
CO2: 27 mmol/L (ref 22–32)
Calcium: 9 mg/dL (ref 8.9–10.3)
Chloride: 106 mmol/L (ref 98–111)
Creatinine, Ser: 0.74 mg/dL (ref 0.61–1.24)
GFR, Estimated: >60 mL/min (ref >60)
Glucose, Bld: 121 mg/dL — ABNORMAL HIGH (ref 70–99)
Potassium: 3.9 mmol/L (ref 3.5–5.1)
Sodium: 139 mmol/L (ref 135–145)
Total Bilirubin: 0.5 mg/dL (ref 0.3–1.2)
Total Protein: 7.7 g/dL (ref 6.5–8.1)

## 2021-12-02 LAB — CBC WITH DIFFERENTIAL/PLATELET
Abs Immature Granulocytes: 0 10*3/uL (ref 0.00–0.07)
Basophils Absolute: 0 10*3/uL (ref 0.0–0.1)
Basophils Relative: 1 %
Eosinophils Absolute: 0.1 10*3/uL (ref 0.0–0.5)
Eosinophils Relative: 3 %
HCT: 44.4 % (ref 39.0–52.0)
Hemoglobin: 14.5 g/dL (ref 13.0–17.0)
Immature Granulocytes: 0 %
Lymphocytes Relative: 45 %
Lymphs Abs: 1.4 10*3/uL (ref 0.7–4.0)
MCH: 28.9 pg (ref 26.0–34.0)
MCHC: 32.7 g/dL (ref 30.0–36.0)
MCV: 88.6 fL (ref 80.0–100.0)
Monocytes Absolute: 0.1 10*3/uL (ref 0.1–1.0)
Monocytes Relative: 4 %
Neutro Abs: 1.4 10*3/uL — ABNORMAL LOW (ref 1.7–7.7)
Neutrophils Relative %: 47 %
Platelets: 154 10*3/uL (ref 150–400)
RBC: 5.01 MIL/uL (ref 4.22–5.81)
RDW: 12.9 % (ref 11.5–15.5)
WBC: 3 10*3/uL — ABNORMAL LOW (ref 4.0–10.5)
nRBC: 0 % (ref 0.0–0.2)

## 2021-12-02 LAB — MAGNESIUM: Magnesium: 2.2 mg/dL (ref 1.7–2.4)

## 2021-12-02 MED ORDER — SODIUM CHLORIDE 0.9% FLUSH
10.0000 mL | INTRAVENOUS | Status: DC | PRN
  Administered 2021-12-02: 10 mL via INTRAVENOUS

## 2021-12-02 MED ORDER — HEPARIN SOD (PORK) LOCK FLUSH 100 UNIT/ML IV SOLN
500.0000 [IU] | Freq: Once | INTRAVENOUS | Status: AC
  Administered 2021-12-02: 500 [IU] via INTRAVENOUS

## 2021-12-02 NOTE — Progress Notes (Signed)
? ?Steven Martinez ?[address] ?[place], [place] [address] ? ? ?CLINIC:  ?Medical Oncology/Hematology ? ?PCP:  ?Olga Coaster, FNP ?[address] / [place] [address] ?[phone] ? ? ?REASON FOR VISIT:  ?Follow-up for adenocarcinoma of the stomach ? ?PRIOR THERAPY: Gastroesophageal FLOT every 2 weeks for 4 cycles ? ?NGS Results: not done ? ?CURRENT THERAPY: Observation ? ?BRIEF ONCOLOGIC HISTORY:  ?Oncology History  ?Gastric cancer ([facility] [facility])  ?06/19/2021 Initial Diagnosis  ? Gastric cancer ([place]) ?  ?06/25/2021 -  Chemotherapy  ? Patient is on Treatment Plan : GASTROESOPHAGEAL FLOT q14d X 4 cycles  ?   ? ? ?CANCER STAGING: ? Cancer Staging  ?Gastric cancer ([place]) ?Staging form: Stomach, AJCC 8th Edition ?- Clinical stage from 06/19/2021: Stage III (cT3, cN1, cM0) - Unsigned ?- Pathologic stage from 12/02/2021: Stage I (ypT2, pN0, cM0) - Unsigned ? ? ?INTERVAL HISTORY:  ?[name], a 31 y.o. male, returns for routine follow-up of his adenocarcinoma of the stomach. Nishant was last seen on 10/02/2021.  ? ?Today he reports feeling good, and he is accompanied by his wife who is acting as interpreter. He is eating well. He reports soreness at the surgical site. He reports numbness and tingling in his fingertips bilaterally, and he denies any associated pain. He has lost 11 lbs since 10/16/2021. He is eating 3 times daily with snacks between meals. He denies nausea and vomiting. His reports he is nervous to eat spicy food, fried food, and salads, and he has not tried to eat them since surgery; otherwise he denies any issues eating foods. He is planning to return to work in May.  ? ?REVIEW OF SYSTEMS:  ?Review of Systems  ?Constitutional:  Negative for appetite change and fatigue.  ?Gastrointestinal:  Positive for abdominal pain (5/10). Negative for nausea and vomiting.  ?Neurological:  Positive for numbness (fingertips).  ?All other systems reviewed and are negative. ? ?PAST MEDICAL/SURGICAL HISTORY:  ?Past  Medical History:  ?Diagnosis Date  ? Acute upper GI bleed 10/13/2020  ? gastric ulcer with visible vessel  ? Alcohol abuse 10/13/2020  ? Quit Jan 2022  ? Cancer [facility])   ? GERD (gastroesophageal reflux disease)   ? Port-A-Cath in place 06/23/2021  ? Positive H. pylori test 09/2020  ? H pylori IgG + Jan 2022 s/p treatment with Prevpac. EGD March 2022 with gastric biopsy negative for H. pylori.   ? ?Past Surgical History:  ?Procedure Laterality Date  ? BIOPSY  12/03/2020  ? Procedure: BIOPSY;  Surgeon: Eloise Harman, DO;  [facility];  Service: Endoscopy;;  ? BIOPSY  05/12/2021  ? Procedure: BIOPSY;  Surgeon: Eloise Harman, DO;  [facility];  Service: Endoscopy;;  ? ESOPHAGOGASTRODUODENOSCOPY (EGD) WITH PROPOFOL N/A 10/14/2020  ?  Surgeon: Eloise Harman, DO;  one non-bleeding, non-obstructing gastric ulcer with visible vessel s/p bipolar cautery, normal examined esophagus and duodenum.   ? ESOPHAGOGASTRODUODENOSCOPY (EGD) WITH PROPOFOL N/A 12/03/2020  ?  Surgeon: Eloise Harman, DO;   nonobstructing, nonbleeding gastric ulcer with clean base s/p biopsied, gastritis biopsied, normal examined duodenum.  Pathology with granulation tissue consistent with ulcer, mild chronic gastritis with focal intestinal metaplasia, H. pylori negative.  Repeat in 3 months.  ? ESOPHAGOGASTRODUODENOSCOPY (EGD) WITH PROPOFOL N/A 05/12/2021  ? Procedure: ESOPHAGOGASTRODUODENOSCOPY (EGD) WITH PROPOFOL;  Surgeon: Eloise Harman, DO;  [facility];  Service: Endoscopy;  Laterality: N/A;  10:30am  ? ESOPHAGOGASTRODUODENOSCOPY (EGD) WITH PROPOFOL  N/A 06/19/2021  ? Procedure: ESOPHAGOGASTRODUODENOSCOPY (EGD) WITH PROPOFOL;  Surgeon: [name], MD;  Location: [facility];  Service: Endoscopy;  Laterality: N/A;  ? EUS N/A 06/19/2021  ? Procedure: UPPER ENDOSCOPIC ULTRASOUND (EUS) RADIAL;  Surgeon: [name], MD;  Location: [facility];  Service: Endoscopy;  Laterality: N/A;  ? GASTRECTOMY  N/A 11/03/2021  ? Procedure: PARTIAL GASTRECTOMY;  Surgeon: Aviva Signs, MD;  Location: [facility];  Service: General;  Laterality: N/A;  ? PORTACATH PLACEMENT Left 06/23/2021  ? Procedure: INSERTION PORT-A-CATH;  Surgeon: Aviva Signs, MD;  Location: [facility];  Service: General;  Laterality: Left;  ? ? ?SOCIAL HISTORY:  ?Social History  ? ?Socioeconomic History  ? Marital status: Married  ?  Spouse name: Not on file  ? Number of children: Not on file  ? Years of education: Not on file  ? Highest education level: Not on file  ?Occupational History  ? Not on file  ?Tobacco Use  ? Smoking status: Never  ? Smokeless tobacco: Never  ?Vaping Use  ? Vaping Use: Never used  ?Substance and Sexual Activity  ? Alcohol use: Not Currently  ?  Alcohol/week: 57.0 - 58.0 standard drinks  ?  Types: 54 Cans of beer, 3 - 4 Shots of liquor per week  ?  Comment: Quit January 2022. Used to drink sixpack daily and 12-15 beers on Saturdays and Sundays.   ? Drug use: Never  ? Sexual activity: Yes  ?Other Topics Concern  ? Not on file  ?Social History Narrative  ? Not on file  ? ?Social Determinants of Health  ? ?Financial Resource Strain: High Risk  ? Difficulty of Paying Living Expenses: Very hard  ?Food Insecurity: No Food Insecurity  ? Worried About Charity fundraiser in the Last Year: Never true  ? Ran Out of Food in the Last Year: Never true  ?Transportation Needs: No Transportation Needs  ? Lack of Transportation (Medical): No  ? Lack of Transportation (Non-Medical): No  ?Physical Activity: Sufficiently Active  ? Days of Exercise per Week: 5 days  ? Minutes of Exercise per Session: 60 min  ?Stress: No Stress Concern Present  ? Feeling of Stress : Not at all  ?Social Connections: Moderately Isolated  ? Frequency of Communication with Friends and Family: More than three times a week  ? Frequency of Social Gatherings with Friends and Family: More than three times a week  ? Attends Religious Services: Never  ? Active Member of Clubs or  Organizations: No  ? Attends Archivist Meetings: Never  ? Marital Status: Married  ?Intimate Partner Violence: Not At Risk  ? Fear of Current or Ex-Partner: No  ? Emotionally Abused: No  ? Physically Abused: No  ? Sexually Abused: No  ? ? ?FAMILY HISTORY:  ?Family History  ?Problem Relation Age of Onset  ? Diabetes Mellitus II Mother   ? Hypertension Mother   ? Colon cancer Neg Hx   ? Colon polyps Neg Hx   ? ? ?CURRENT MEDICATIONS:  ?Current Outpatient Medications  ?Medication Sig Dispense Refill  ? bariatric multiple vitamin (ADVANCED MULTI EA) CHEW chewable tablet Chew 2 tablets by mouth daily. 60 tablet 6  ? Multiple Vitamin (MULTIVITAMIN WITH MINERALS) TABS tablet Take 1 tablet by mouth in the morning.    ? oxyCODONE (ROXICODONE) 5 MG immediate release tablet Take 1 tablet (5 mg total) by mouth every 4 (four) hours as needed for severe pain. 40 tablet 0  ?  pantoprazole (PROTONIX) 40 MG tablet Take 1 tablet (40 mg total) by mouth daily. 1 tablet 30 tablet 3  ? prochlorperazine (COMPAZINE) 10 MG tablet Take 1 tablet (10 mg total) by mouth every 6 (six) hours as needed (Nausea or vomiting). 30 tablet 6  ? ?No current facility-administered medications for this visit.  ? ? ?ALLERGIES:  ?Allergies  ?Allergen Reactions  ? Oxaliplatin Itching  ? ? ?PHYSICAL EXAM:  ?Performance status (ECOG): 0 - Asymptomatic ? ?There were no vitals filed for this visit. ?Wt Readings from Last 3 Encounters:  ?12/02/21 139 lb (63 kg)  ?11/13/21 141 lb (64 kg)  ?10/30/21 150 lb (68 kg)  ? ?Physical Exam ?Vitals reviewed.  ?Constitutional:   ?   Appearance: Normal appearance.  ?Cardiovascular:  ?   Rate and Rhythm: Normal rate and regular rhythm.  ?   Pulses: Normal pulses.  ?   Heart sounds: Normal heart sounds.  ?Pulmonary:  ?   Effort: Pulmonary effort is normal.  ?   Breath sounds: Normal breath sounds.  ?Abdominal:  ?   General: A surgical scar is present.  ?   Palpations: Abdomen is soft. There is no mass.  ?   Tenderness:  There is abdominal tenderness.  ?Neurological:  ?   General: No focal deficit present.  ?   Mental Status: He is alert and oriented to person, place, and time.  ?Psychiatric:     ?   Mood and Affect: Mood nor

## 2021-12-02 NOTE — Patient Instructions (Addendum)
[place] at [facility] ?Discharge Instructions ? ?You were seen and examined today by Dr. Delton Coombes. He reviewed your most recent labs and everything looks good. Please keep follow up appointment as scheduled in 2 months after a CT scan of your chest, abdomen, and pelvis.   ? ? ?Thank you for choosing [place] at [facility] to provide your oncology and hematology care.  To afford each patient quality time with our provider, please arrive at least 15 minutes before your scheduled appointment time.  ? ?If you have a lab appointment with the [facility] please come in thru the Main Entrance and check in at the main information desk. ? ?You need to re-schedule your appointment should you arrive 10 or more minutes late.  We strive to give you quality time with our providers, and arriving late affects you and other patients whose appointments are after yours.  Also, if you no show three or more times for appointments you may be dismissed from the clinic at the providers discretion.     ?Again, thank you for choosing [facility].  Our hope is that these requests will decrease the amount of time that you wait before being seen by our physicians.       ?_____________________________________________________________ ? ?Should you have questions after your visit to [facility], please contact our office at [phone] and follow the prompts.  Our office hours are 8:00 a.m. and 4:30 p.m. Monday - Friday.  Please note that voicemails left after 4:00 p.m. may not be returned until the following business day.  We are closed weekends and major holidays.  You do have access to a nurse 24-7, just call the main number to the clinic [phone] and do not press any options, hold on the line and a nurse will answer the phone.   ? ?For prescription refill requests, have your pharmacy contact our office and allow 72 hours.   ? ?Due to Covid, you will need  to wear a mask upon entering the hospital. If you do not have a mask, a mask will be given to you at the Main Entrance upon arrival. For doctor visits, patients may have 1 support person age 75 or older with them. For treatment visits, patients can not have anyone with them due to social distancing guidelines and our immunocompromised population.  ? ?  ?

## 2021-12-03 LAB — CEA: CEA: 1.7 ng/mL (ref 0.0–4.7)

## 2021-12-04 ENCOUNTER — Encounter: Payer: Self-pay | Admitting: General Surgery

## 2021-12-04 ENCOUNTER — Ambulatory Visit (INDEPENDENT_AMBULATORY_CARE_PROVIDER_SITE_OTHER): Payer: 59 | Admitting: General Surgery

## 2021-12-04 ENCOUNTER — Other Ambulatory Visit: Payer: Self-pay

## 2021-12-04 VITALS — BP 117/78 | HR 66 | Temp 97.5°F | Resp 12 | Ht 67.0 in | Wt 137.0 lb

## 2021-12-04 NOTE — Progress Notes (Signed)
Subjective:  ?  ? Steven Martinez  ?Here for postoperative visit, status post partial gastrectomy with Roux-en-Y reconstruction.  Patient has no complaints.  He was recently seen by Dr. Delton Coombes. ?Objective:  ? ? BP 117/78   Pulse 66   Temp (!) 97.5 ?F (36.4 ?C) (Other (Comment))   Resp 12   Ht '5\' 7"'$  (1.702 m)   Wt 137 lb (62.1 kg)   SpO2 98%   BMI 21.46 kg/m?  ? ?General:  alert, cooperative, and no distress  ?Abdomen is soft, incision well-healed. ?   ? ?Assessment:  ? ? Doing well postoperatively.  ?  ?Plan:  ? ?Patient is scheduled for a follow-up scan of his abdomen in May.  If that scan looks good, he can have the Port-A-Cath removed and then return back to work.  This can be scheduled over the phone.  Further management is pending that test result.  He cannot work due to the Port-A-Cath being in place. ?

## 2021-12-09 ENCOUNTER — Other Ambulatory Visit (HOSPITAL_COMMUNITY): Payer: Self-pay | Admitting: *Deleted

## 2021-12-09 ENCOUNTER — Other Ambulatory Visit (HOSPITAL_COMMUNITY): Payer: Self-pay

## 2021-12-17 ENCOUNTER — Other Ambulatory Visit (HOSPITAL_COMMUNITY): Payer: Self-pay | Admitting: *Deleted

## 2021-12-17 MED ORDER — OXYCODONE HCL 5 MG PO TABS: 5.0000 mg | ORAL_TABLET | Freq: Three times a day (TID) | ORAL | 0 refills | Status: DC | PRN

## 2021-12-18 ENCOUNTER — Encounter (HOSPITAL_COMMUNITY): Payer: Self-pay | Admitting: *Deleted

## 2021-12-22 ENCOUNTER — Encounter (HOSPITAL_COMMUNITY): Payer: 59 | Admitting: Dietician

## 2021-12-22 ENCOUNTER — Telehealth (HOSPITAL_COMMUNITY): Payer: Self-pay | Admitting: Dietician

## 2021-12-22 ENCOUNTER — Other Ambulatory Visit (HOSPITAL_COMMUNITY): Payer: Self-pay | Admitting: *Deleted

## 2021-12-22 MED ORDER — PANTOPRAZOLE SODIUM 40 MG PO TBEC: 40.0000 mg | DELAYED_RELEASE_TABLET | Freq: Every day | ORAL | 3 refills | Status: DC

## 2021-12-22 NOTE — Telephone Encounter (Signed)
Nutrition Follow-up: ? ?Patient with gastric cancer. He completed neoadjuvant chemotherapy with FLOT. S/p partial gastrectomy with Roux-en-Y reconstruction with Dr. Arnoldo Morale on 2/13.  ? ?Spoke with wife of patient Steven Martinez) via telephone. She reports patient is doing well and eating good. [place] reports patient has been trying more foods and is eating 3 meals/day with regular sized portions. She denies nausea, vomiting, diarrhea. Patient is having 3 bowel movements. This was normal for him prior to cancer diagnosis. [place] reports patient is drinking 8-10 bottles of water. Patient is not currently taking bariatric vitamins or calcium supplements. [place] reports they were not called into [facility]. She also reports patient is out of protonix and asking if he needs to continue taking these.  ? ? ? ?Medications: reviewed  ? ?Labs: 3/14 - glucose 121, ALT 55 ? ?Anthropometrics: Last weight 137 lb on 3/16 decreased (s/p partial gastrectomy 2/13)  ? ?2/23 - 141 lb  ?1/26 - 150 lb  ?12/29 - 154 lb 9.6 oz  ? ? ?NUTRITION DIAGNOSIS: Unintended weight loss ongoing ? ? ?INTERVENTION:  ?Encouraged high protein snacks in between meals ?Discussed strategies for incorporating non-starchy vegetables (well-cooked, chopped, and peeled) - will mail handout  ?Provided instructions for bariatric MVI and elemental Ca (MVI BID, Tums 500 mg TID - waiting 2 hours after taking MVI) - wife reports understanding, teach back method used ?Informed RN (Tomi) about medications - RN will contact [place] with update as able ?  ? ?MONITORING, EVALUATION, GOAL: weight trends, intake  ? ? ?NEXT VISIT: via telephone ~ 4 weeks ? ? ? ?

## 2022-01-20 ENCOUNTER — Encounter (HOSPITAL_COMMUNITY): Payer: Self-pay | Admitting: *Deleted

## 2022-01-29 ENCOUNTER — Encounter (HOSPITAL_COMMUNITY): Payer: Self-pay | Admitting: Hematology

## 2022-02-02 ENCOUNTER — Other Ambulatory Visit (HOSPITAL_COMMUNITY): Payer: 59

## 2022-02-05 ENCOUNTER — Inpatient Hospital Stay (HOSPITAL_COMMUNITY): Payer: 59 | Attending: Hematology

## 2022-02-05 ENCOUNTER — Ambulatory Visit (HOSPITAL_COMMUNITY)
Admission: RE | Admit: 2022-02-05 | Discharge: 2022-02-05 | Disposition: A | Discharge status: Home / Self Care | Payer: Self-pay | Source: Ambulatory Visit | Attending: Hematology | Admitting: Hematology

## 2022-02-05 VITALS — BP 142/91 | HR 76 | Temp 97.8°F | Resp 18

## 2022-02-05 DIAGNOSIS — G629 Polyneuropathy, unspecified: Secondary | ICD-10-CM | POA: Diagnosis not present

## 2022-02-05 DIAGNOSIS — C169 Malignant neoplasm of stomach, unspecified: Secondary | ICD-10-CM | POA: Insufficient documentation

## 2022-02-05 DIAGNOSIS — R634 Abnormal weight loss: Secondary | ICD-10-CM | POA: Diagnosis not present

## 2022-02-05 DIAGNOSIS — Z903 Acquired absence of stomach [part of]: Secondary | ICD-10-CM

## 2022-02-05 DIAGNOSIS — Z87891 Personal history of nicotine dependence: Secondary | ICD-10-CM | POA: Diagnosis not present

## 2022-02-05 DIAGNOSIS — E559 Vitamin D deficiency, unspecified: Secondary | ICD-10-CM | POA: Diagnosis not present

## 2022-02-05 DIAGNOSIS — C163 Malignant neoplasm of pyloric antrum: Secondary | ICD-10-CM

## 2022-02-05 DIAGNOSIS — Z95828 Presence of other vascular implants and grafts: Secondary | ICD-10-CM

## 2022-02-05 LAB — VITAMIN B12: Vitamin B-12: 532 pg/mL (ref 180–914)

## 2022-02-05 LAB — COMPREHENSIVE METABOLIC PANEL
ALT: 35 U/L (ref 0–44)
AST: 24 U/L (ref 15–41)
Albumin: 4.4 g/dL (ref 3.5–5.0)
Alkaline Phosphatase: 94 U/L (ref 38–126)
Anion gap: 6 (ref 5–15)
BUN: 16 mg/dL (ref 6–20)
CO2: 26 mmol/L (ref 22–32)
Calcium: 8.9 mg/dL (ref 8.9–10.3)
Chloride: 106 mmol/L (ref 98–111)
Creatinine, Ser: 1.05 mg/dL (ref 0.61–1.24)
GFR, Estimated: >60 mL/min (ref >60)
Glucose, Bld: 135 mg/dL — ABNORMAL HIGH (ref 70–99)
Potassium: 3.8 mmol/L (ref 3.5–5.1)
Sodium: 138 mmol/L (ref 135–145)
Total Bilirubin: <0.1 mg/dL — ABNORMAL LOW (ref 0.3–1.2)
Total Protein: 7.9 g/dL (ref 6.5–8.1)

## 2022-02-05 LAB — CBC WITH DIFFERENTIAL/PLATELET
Abs Immature Granulocytes: 0.01 10*3/uL (ref 0.00–0.07)
Basophils Absolute: 0 10*3/uL (ref 0.0–0.1)
Basophils Relative: 1 %
Eosinophils Absolute: 0.2 10*3/uL (ref 0.0–0.5)
Eosinophils Relative: 3 %
HCT: 44.2 % (ref 39.0–52.0)
Hemoglobin: 14.9 g/dL (ref 13.0–17.0)
Immature Granulocytes: 0 %
Lymphocytes Relative: 36 %
Lymphs Abs: 1.9 10*3/uL (ref 0.7–4.0)
MCH: 28 pg (ref 26.0–34.0)
MCHC: 33.7 g/dL (ref 30.0–36.0)
MCV: 83.1 fL (ref 80.0–100.0)
Monocytes Absolute: 0.2 10*3/uL (ref 0.1–1.0)
Monocytes Relative: 4 %
Neutro Abs: 2.9 10*3/uL (ref 1.7–7.7)
Neutrophils Relative %: 56 %
Platelets: 177 10*3/uL (ref 150–400)
RBC: 5.32 MIL/uL (ref 4.22–5.81)
RDW: 13.7 % (ref 11.5–15.5)
WBC: 5.3 10*3/uL (ref 4.0–10.5)
nRBC: 0 % (ref 0.0–0.2)

## 2022-02-05 LAB — FOLATE: Folate: 11.4 ng/mL (ref >5.9)

## 2022-02-05 LAB — IRON AND TIBC
Iron: 62 ug/dL (ref 45–182)
Saturation Ratios: 19 % (ref 17.9–39.5)
TIBC: 319 ug/dL (ref 250–450)
UIBC: 257 ug/dL

## 2022-02-05 LAB — FERRITIN: Ferritin: 86 ng/mL (ref 24–336)

## 2022-02-05 LAB — VITAMIN D 25 HYDROXY (VIT D DEFICIENCY, FRACTURES): Vit D, 25-Hydroxy: 25.57 ng/mL — ABNORMAL LOW (ref 30–100)

## 2022-02-05 MED ORDER — SODIUM CHLORIDE 0.9% FLUSH
10.0000 mL | INTRAVENOUS | Status: DC | PRN
  Administered 2022-02-05: 10 mL via INTRAVENOUS

## 2022-02-05 MED ORDER — IOHEXOL 300 MG/ML  SOLN
100.0000 mL | Freq: Once | INTRAMUSCULAR | Status: AC | PRN
  Administered 2022-02-05: 100 mL via INTRAVENOUS

## 2022-02-05 NOTE — Progress Notes (Signed)
Patient was here today for port flush and labs.  Port remained accessed post appointment for his CT scan.  Patient remained stable during and post procedure, and tolerated well.  He was discharged ambulatory in stable condition from clinic to radiology.

## 2022-02-05 NOTE — Patient Instructions (Signed)
You had your port flushed today. We drew labs and while some results will come back today, others will take a few days.  We will review everything with you at your next appointment. We left your port accessed for your CT scan.  They will deaccess it down there post scan.

## 2022-02-07 LAB — COPPER, SERUM: Copper: 105 ug/dL (ref 63–121)

## 2022-02-10 ENCOUNTER — Inpatient Hospital Stay (HOSPITAL_BASED_OUTPATIENT_CLINIC_OR_DEPARTMENT_OTHER): Payer: 59 | Admitting: Hematology

## 2022-02-10 VITALS — BP 127/89 | HR 73 | Temp 98.2°F | Resp 18 | Ht 67.0 in | Wt 143.3 lb

## 2022-02-10 DIAGNOSIS — E559 Vitamin D deficiency, unspecified: Secondary | ICD-10-CM | POA: Diagnosis not present

## 2022-02-10 DIAGNOSIS — C169 Malignant neoplasm of stomach, unspecified: Secondary | ICD-10-CM | POA: Diagnosis not present

## 2022-02-10 DIAGNOSIS — G629 Polyneuropathy, unspecified: Secondary | ICD-10-CM | POA: Diagnosis not present

## 2022-02-10 DIAGNOSIS — C163 Malignant neoplasm of pyloric antrum: Secondary | ICD-10-CM

## 2022-02-10 DIAGNOSIS — Z87891 Personal history of nicotine dependence: Secondary | ICD-10-CM | POA: Diagnosis not present

## 2022-02-10 DIAGNOSIS — R634 Abnormal weight loss: Secondary | ICD-10-CM | POA: Diagnosis not present

## 2022-02-10 NOTE — Progress Notes (Signed)
Steven Martinez, Steven Martinez [id number]   CLINIC:  Medical Oncology/Hematology  PCP:  Olga Coaster, FNP [address] / [place] [address] [phone]   REASON FOR VISIT:  Follow-up for adenocarcinoma of the stomach  PRIOR THERAPY: Gastroesophageal FLOT every 2 weeks for 4 cycles  NGS Results: not done  CURRENT THERAPY: surveillance  BRIEF ONCOLOGIC HISTORY:  Oncology History  Gastric cancer ([place])  06/19/2021 Initial Diagnosis   Gastric cancer ([place])    06/25/2021 -  Chemotherapy   Patient is on Treatment Plan : GASTROESOPHAGEAL FLOT q14d X 4 cycles        CANCER STAGING: Cancer Staging  Gastric cancer ([place]) Staging form: Stomach, AJCC 8th Edition - Clinical stage from 06/19/2021: Stage III (cT3, cN1, cM0) - Unsigned - Pathologic stage from 12/02/2021: Stage I (ypT2, pN0, cM0) - Unsigned   INTERVAL HISTORY:  Steven Martinez, a 31 y.o. male, returns for routine follow-up of his adenocarcinoma of the stomach. Steven Martinez was last seen on 12/02/2021.   Today he reports feeling good, and he is accompanied by his wife who is acting as interpreter. His eating has improved. He has gained 4 lbs since his last visit. He reports itching on his abdomen at the site of his surgical scar. He reports his boss will not allow him to return to work until his port-a-cath is removed.   REVIEW OF SYSTEMS:  Review of Systems  Constitutional:  Negative for appetite change, fatigue and unexpected weight change (+4 lbs).  Gastrointestinal:  Positive for abdominal pain (3/10 L side).  Skin:  Positive for itching.  Neurological:  Positive for headaches.  All other systems reviewed and are negative.  PAST MEDICAL/SURGICAL HISTORY:  Past Medical History:  Diagnosis Date   Acute upper GI bleed 10/13/2020   gastric ulcer with visible vessel   Alcohol abuse 10/13/2020   Quit Jan 2022   Cancer [facility])    GERD (gastroesophageal reflux disease)    Port-A-Cath in  place 06/23/2021   Positive H. pylori test 09/2020   H pylori IgG + Jan 2022 s/p treatment with Prevpac. EGD March 2022 with gastric biopsy negative for H. pylori.    Past Surgical History:  Procedure Laterality Date   BIOPSY  12/03/2020   Procedure: BIOPSY;  Surgeon: Eloise Harman, DO;  [facility];  Service: Endoscopy;;   BIOPSY  05/12/2021   Procedure: BIOPSY;  Surgeon: Eloise Harman, DO;  [facility];  Service: Endoscopy;;   ESOPHAGOGASTRODUODENOSCOPY (EGD) WITH PROPOFOL N/A 10/14/2020    Surgeon: Eloise Harman, DO;  one non-bleeding, non-obstructing gastric ulcer with visible vessel s/p bipolar cautery, normal examined esophagus and duodenum.    ESOPHAGOGASTRODUODENOSCOPY (EGD) WITH PROPOFOL N/A 12/03/2020    Surgeon: Eloise Harman, DO;   nonobstructing, nonbleeding gastric ulcer with clean base s/p biopsied, gastritis biopsied, normal examined duodenum.  Pathology with granulation tissue consistent with ulcer, mild chronic gastritis with focal intestinal metaplasia, H. pylori negative.  Repeat in 3 months.   ESOPHAGOGASTRODUODENOSCOPY (EGD) WITH PROPOFOL N/A 05/12/2021   Procedure: ESOPHAGOGASTRODUODENOSCOPY (EGD) WITH PROPOFOL;  Surgeon: Eloise Harman, DO;  [facility];  Service: Endoscopy;  Laterality: N/A;  10:30am   ESOPHAGOGASTRODUODENOSCOPY (EGD) WITH PROPOFOL N/A 06/19/2021   Procedure: ESOPHAGOGASTRODUODENOSCOPY (EGD) WITH PROPOFOL;  Surgeon: [name], MD;  [facility];  Service: Endoscopy;  Laterality: N/A;   EUS N/A 06/19/2021   Procedure: UPPER ENDOSCOPIC ULTRASOUND (  EUS) RADIAL;  Surgeon: [name], MD;  Location: [facility];  Service: Endoscopy;  Laterality: N/A;   GASTRECTOMY N/A 11/03/2021   Procedure: PARTIAL GASTRECTOMY;  Surgeon: Aviva Signs, MD;  [facility];  Service: General;  Laterality: N/A;   PORTACATH PLACEMENT Left 06/23/2021   Procedure: INSERTION PORT-A-CATH;  Surgeon: Aviva Signs, MD;  Location: [facility];  Service: General;  Laterality: Left;    SOCIAL HISTORY:  Social History   Socioeconomic History   Marital status: Married    Spouse name: Not on file   Number of children: Not on file   Years of education: Not on file   Highest education level: Not on file  Occupational History   Not on file  Tobacco Use   Smoking status: Never   Smokeless tobacco: Never  Vaping Use   Vaping Use: Never used  Substance and Sexual Activity   Alcohol use: Not Currently    Alcohol/week: 57.0 - 58.0 standard drinks    Types: 54 Cans of beer, 3 - 4 Shots of liquor per week    Comment: Quit January 2022. Used to drink sixpack daily and 12-15 beers on Saturdays and Sundays.    Drug use: Never   Sexual activity: Yes  Other Topics Concern   Not on file  Social History Narrative   Not on file   Social Determinants of Health   Financial Resource Strain: High Risk   Difficulty of Paying Living Expenses: Very hard  Food Insecurity: No Food Insecurity   Worried About Running Out of Food in the Last Year: Never true   Ran Out of Food in the Last Year: Never true  Transportation Needs: No Transportation Needs   Lack of Transportation (Medical): No   Lack of Transportation (Non-Medical): No  Physical Activity: Sufficiently Active   Days of Exercise per Week: 5 days   Minutes of Exercise per Session: 60 min  Stress: No Stress Concern Present   Feeling of Stress : Not at all  Social Connections: Moderately Isolated   Frequency of Communication with Friends and Family: More than three times a week   Frequency of Social Gatherings with Friends and Family: More than three times a week   Attends Religious Services: Never   Marine scientist or Organizations: No   Attends Music therapist: Never   Marital Status: Married  Human resources officer Violence: Not At Risk   Fear of Current or Ex-Partner: No   Emotionally Abused: No   Physically Abused: No   Sexually  Abused: No    FAMILY HISTORY:  Family History  Problem Relation Age of Onset   Diabetes Mellitus II Mother    Hypertension Mother    Colon cancer Neg Hx    Colon polyps Neg Hx     CURRENT MEDICATIONS:  Current Outpatient Medications  Medication Sig Dispense Refill   bariatric multiple vitamin (ADVANCED MULTI EA) CHEW chewable tablet Chew 2 tablets by mouth daily.     Multiple Vitamin (MULTIVITAMIN WITH MINERALS) TABS tablet Take 1 tablet by mouth in the morning.     oxyCODONE (ROXICODONE) 5 MG immediate release tablet Take 1 tablet (5 mg total) by mouth every 8 (eight) hours as needed for severe pain. 60 tablet 0   pantoprazole (PROTONIX) 40 MG tablet Take 1 tablet (40 mg total) by mouth daily. 1 tablet 30 tablet 3   prochlorperazine (COMPAZINE) 10 MG tablet Take 1 tablet (10 mg total) by mouth every 6 (six)  hours as needed (Nausea or vomiting). 30 tablet 6   No current facility-administered medications for this visit.    ALLERGIES:  Allergies  Allergen Reactions   Oxaliplatin Itching    PHYSICAL EXAM:  Performance status (ECOG): 0 - Asymptomatic  There were no vitals filed for this visit. Wt Readings from Last 3 Encounters:  12/04/21 137 lb (62.1 kg)  12/02/21 139 lb (63 kg)  11/13/21 141 lb (64 kg)   Physical Exam Vitals reviewed.  Constitutional:      Appearance: Normal appearance.  Cardiovascular:     Rate and Rhythm: Normal rate and regular rhythm.     Pulses: Normal pulses.     Heart sounds: Normal heart sounds.  Pulmonary:     Effort: Pulmonary effort is normal.     Breath sounds: Normal breath sounds.  Abdominal:     General: A surgical scar is present.  Neurological:     General: No focal deficit present.     Mental Status: He is alert and oriented to person, place, and time.  Psychiatric:        Mood and Affect: Mood normal.        Behavior: Behavior normal.     LABORATORY DATA:  I have reviewed the labs as listed.     Latest Ref Rng & Units  02/05/2022    1:13 PM 12/02/2021    1:53 PM 11/07/2021    4:03 AM  CBC  WBC 4.0 - 10.5 K/uL 5.3   3.0   4.9    Hemoglobin 13.0 - 17.0 g/dL 14.9   14.5   8.6    Hematocrit 39.0 - 52.0 % 44.2   44.4   27.5    Platelets 150 - 400 K/uL 177   154   141        Latest Ref Rng & Units 02/05/2022    1:13 PM 12/02/2021    1:53 PM 11/07/2021    4:03 AM  CMP  Glucose 70 - 99 mg/dL 135   121   92    BUN 6 - 20 mg/dL '16   13   12    '$ Creatinine 0.61 - 1.24 mg/dL 1.05   0.74   0.58    Sodium 135 - 145 mmol/L 138   139   137    Potassium 3.5 - 5.1 mmol/L 3.8   3.9   3.5    Chloride 98 - 111 mmol/L 106   106   106    CO2 22 - 32 mmol/L '26   27   23    '$ Calcium 8.9 - 10.3 mg/dL 8.9   9.0   8.1    Total Protein 6.5 - 8.1 g/dL 7.9   7.7     Total Bilirubin 0.3 - 1.2 mg/dL <0.1   0.5     Alkaline Phos 38 - 126 U/L 94   91     AST 15 - 41 U/L 24   33     ALT 0 - 44 U/L 35   55       DIAGNOSTIC IMAGING:  I have independently reviewed the scans and discussed with the patient. CT CHEST ABDOMEN PELVIS W CONTRAST  Result Date: 02/06/2022 CLINICAL DATA:  History of gastric cancer, currently on chemotherapy infusion. Monitor. * Tracking Code: BO * EXAM: CT CHEST, ABDOMEN, AND PELVIS WITH CONTRAST TECHNIQUE: Multidetector CT imaging of the chest, abdomen and pelvis was performed following the standard protocol during bolus administration of intravenous  contrast. RADIATION DOSE REDUCTION: This exam was performed according to the departmental dose-optimization program which includes automated exposure control, adjustment of the mA and/or kV according to patient size and/or use of iterative reconstruction technique. CONTRAST:  164m OMNIPAQUE IOHEXOL 300 MG/ML  SOLN COMPARISON:  Multiple priors including CT September 11, 2021 and PET-CT May 29, 2021 FINDINGS: CT CHEST FINDINGS Cardiovascular: Accessed left chest wall Port-A-Cath with tip in the proximal SVC. Minimal aortic atherosclerosis with normal caliber thoracic  aorta. No central pulmonary embolus on this nondedicated study. Normal size heart. No significant pericardial effusion/thickening. Mediastinum/Nodes: No suspicious thyroid nodule. No pathologically enlarged mediastinal, hilar or axillary lymph nodes. The esophagus is grossly unremarkable. Lungs/Pleura: No suspicious pulmonary nodules or masses. No focal airspace consolidation. No pleural effusion. No pneumothorax. Musculoskeletal: No aggressive lytic or blastic lesion of bone. No acute osseous abnormality. CT ABDOMEN PELVIS FINDINGS Hepatobiliary: No suspicious hepatic lesion. Gallbladder is unremarkable. No biliary ductal dilation. Pancreas: No pancreatic ductal dilation or evidence of acute inflammation. Spleen: No splenomegaly or focal splenic lesion. Adrenals/Urinary Tract: Adrenal glands are unremarkable. Kidneys are normal, without renal calculi, focal lesion, or hydronephrosis. Bladder is unremarkable. Stomach/Bowel: Postsurgical change of partial gastrectomy with anastomotic sutures in the upper anterior abdomen on image 62/2 and distal anastomotic sutures best seen on coronal image 106/5. There is a small likely transient small bowel small bowel intussusception best seen on sagittal image 122/5 at the distal anastomotic suture line. No suspicious soft tissue nodularity identified along the anastomotic line. Radiopaque enteric contrast material traverses distal loops of small bowel. No pathologic dilation of small or large bowel. No evidence of acute bowel inflammation. Vascular/Lymphatic: Normal caliber abdominal aorta. No pathologically enlarged abdominal or pelvic lymph nodes. Reproductive: Prostate is unremarkable. Other: No significant abdominopelvic free fluid. No discrete peritoneal or omental nodularity. Musculoskeletal: No aggressive lytic or blastic lesion of bone. IMPRESSION: 1. Postsurgical change of partial gastrectomy without evidence of recurrent or metastatic disease within the chest,  abdomen, or pelvis. 2. Small likely transient small bowel intussusception at the distal anastomotic suture line. Electronically Signed   By: JDahlia BailiffM.D.   On: 02/06/2022 14:44     ASSESSMENT:  1.  Gastric antral adenocarcinoma: - EGD on 05/12/2021 gastric antral mass with large cratered ulcer.  Normal duodenal bulb and first part of the duodenum. - Biopsy of the antrum ulcer consistent with adenocarcinoma, at least intramucosal.  Depth of invasion cannot be judged as the biopsy was superficial. - CT CAP on 05/14/2021 with gastric antral soft tissue thickening/mass measuring 3.7 x 3 cm.  1.2 cm portacaval lymph node.  Node within the perigastric fat anterior to the pylorus measures 5 mm.  No evidence of distant metastatic disease in the abdomen or pelvis.  Isolated 4 mm right middle lobe lung nodule most likely incidental/benign. - 13 pound weight loss in the last 3 months. - PET scan on 05/29/2021 showed mass in the gastric antrum FDG avid with SUV 5.32.  No definite evidence of FDG avid nodal metastasis or distant metastatic disease.  There is only low-level FDG uptake associated with previously described prominent upper abdominal lymph nodes. - EGD/EUS on 06/19/2021-uT3N1 4 cm cratered noncircumferential distal gastric adenocarcinoma.  There is one suspicious 9 mm perigastric lymph node nearby the primary gastric mass. - Partial gastrectomy and D1 dissection on 11/03/2021 by Dr. JArnoldo Morale - Pathology: 1.9 cm grade 2 adenocarcinoma, 0/19 lymph nodes involved, margins negative, ypT2 ypN0.  2.  Social/family history: - He is married and  seen with his wife today. - He does Architect work. - He quit smoking cigarettes 13 years ago.  Quit drinking alcohol in March 2022. - No family history of malignancies.   PLAN:  1.  Stage III (uT3 N1) gastric adenocarcinoma: - He does not report any new onset pains. - Physical exam was benign. - Reviewed labs today which showed normal LFTs and CBC. -  Reviewed CT CAP from 02/05/2022 which showed postsurgical changes of partial gastrectomy without evidence of recurrence or metastatic disease. - Patient would like to have the port removed as it is interfering with his work.  He will call Dr. Arnoldo Morale office and have it removed. - We will see him back in 4 months for follow-up with repeat scan and labs.  2.  Weight loss: - He is eating multiple small meals per day.  He is continuing to gain weight.  3.  Peripheral neuropathy: - He has on and off numbness in the fingertips which is improving.  4.  Low vitamin D levels: - Vitamin D is 25.  Recommend starting vitamin D 400-800 IU daily.   Orders placed this encounter:  No orders of the defined types were placed in this encounter.    Derek Jack, MD [place] [phone]   I, Thana Ates, am acting as a scribe for Dr. Derek Jack.  I, Derek Jack MD, have reviewed the above documentation for accuracy and completeness, and I agree with the above.

## 2022-02-10 NOTE — Patient Instructions (Addendum)
[facility] Discharge Instructions  You were seen and examined today by Dr. Delton Coombes.  Dr. Delton Coombes discussed your most recent lab work and CT scan revealed that everything looks good.  Follow-up as scheduled in 4 months.    Thank you for choosing [place] [facility] to provide your oncology and hematology care.  To afford each patient quality time with our provider, please arrive at least 15 minutes before your scheduled appointment time.   If you have a lab appointment with the [facility] please come in thru the Main Entrance and check in at the main information desk.  You need to re-schedule your appointment should you arrive 10 or more minutes late.  We strive to give you quality time with our providers, and arriving late affects you and other patients whose appointments are after yours.  Also, if you no show three or more times for appointments you may be dismissed from the clinic at the providers discretion.     Again, thank you for choosing [facility].  Our hope is that these requests will decrease the amount of time that you wait before being seen by our physicians.       _____________________________________________________________  Should you have questions after your visit to [facility], please contact our office at [phone] and follow the prompts.  Our office hours are 8:00 a.m. and 4:30 p.m. Monday - Friday.  Please note that voicemails left after 4:00 p.m. may not be returned until the following business day.  We are closed weekends and major holidays.  You do have access to a nurse 24-7, just call the main number to the clinic [phone] and do not press any options, hold on the line and a nurse will answer the phone.    For prescription refill requests, have your pharmacy contact our office and allow 72 hours.    Due to Covid, you will need to wear a mask upon entering  the hospital. If you do not have a mask, a mask will be given to you at the Main Entrance upon arrival. For doctor visits, patients may have 1 support person age 73 or older with them. For treatment visits, patients can not have anyone with them due to social distancing guidelines and our immunocompromised population.

## 2022-02-11 LAB — METHYLMALONIC ACID, SERUM: Methylmalonic Acid, Quantitative: 102 nmol/L (ref 0–378)

## 2022-02-24 ENCOUNTER — Ambulatory Visit: Payer: Self-pay | Admitting: General Surgery

## 2022-02-26 NOTE — H&P (Signed)
Steven Martinez is an 31 y.o. male.   Chief Complaint: Gastric carcinoma, finished with chemotherapy HPI: Patient is a 31 year old Hispanic male who has finished treatment for his gastric carcinoma.  He is referred back to my care for Port-A-Cath removal.  Past Medical History:  Diagnosis Date   Acute upper GI bleed 10/13/2020   gastric ulcer with visible vessel   Alcohol abuse 10/13/2020   Quit Jan 2022   Cancer [facility])    GERD (gastroesophageal reflux disease)    Port-A-Cath in place 06/23/2021   Positive H. pylori test 09/2020   H pylori IgG + Jan 2022 s/p treatment with Prevpac. EGD March 2022 with gastric biopsy negative for H. pylori.     Past Surgical History:  Procedure Laterality Date   BIOPSY  12/03/2020   Procedure: BIOPSY;  Surgeon: Eloise Harman, DO;  [facility];  Service: Endoscopy;;   BIOPSY  05/12/2021   Procedure: BIOPSY;  Surgeon: Eloise Harman, DO;  [facility];  Service: Endoscopy;;   ESOPHAGOGASTRODUODENOSCOPY (EGD) WITH PROPOFOL N/A 10/14/2020    Surgeon: Eloise Harman, DO;  one non-bleeding, non-obstructing gastric ulcer with visible vessel s/p bipolar cautery, normal examined esophagus and duodenum.    ESOPHAGOGASTRODUODENOSCOPY (EGD) WITH PROPOFOL N/A 12/03/2020    Surgeon: Eloise Harman, DO;   nonobstructing, nonbleeding gastric ulcer with clean base s/p biopsied, gastritis biopsied, normal examined duodenum.  Pathology with granulation tissue consistent with ulcer, mild chronic gastritis with focal intestinal metaplasia, H. pylori negative.  Repeat in 3 months.   ESOPHAGOGASTRODUODENOSCOPY (EGD) WITH PROPOFOL N/A 05/12/2021   Procedure: ESOPHAGOGASTRODUODENOSCOPY (EGD) WITH PROPOFOL;  Surgeon: Eloise Harman, DO;  [facility];  Service: Endoscopy;  Laterality: N/A;  10:30am   ESOPHAGOGASTRODUODENOSCOPY (EGD) WITH PROPOFOL N/A 06/19/2021   Procedure: ESOPHAGOGASTRODUODENOSCOPY (EGD) WITH PROPOFOL;  Surgeon:  [name], MD;  [facility];  Service: Endoscopy;  Laterality: N/A;   EUS N/A 06/19/2021   Procedure: UPPER ENDOSCOPIC ULTRASOUND (EUS) RADIAL;  Surgeon: [name], MD;  [facility];  Service: Endoscopy;  Laterality: N/A;   GASTRECTOMY N/A 11/03/2021   Procedure: PARTIAL GASTRECTOMY;  Surgeon: Aviva Signs, MD;  Location: [facility];  Service: General;  Laterality: N/A;   PORTACATH PLACEMENT Left 06/23/2021   Procedure: INSERTION PORT-A-CATH;  Surgeon: Aviva Signs, MD;  Location[facility];  Service: General;  Laterality: Left;    Family History  Problem Relation Age of Onset   Diabetes Mellitus II Mother    Hypertension Mother    Colon cancer Neg Hx    Colon polyps Neg Hx    Social History:  reports that he has never smoked. He has never used smokeless tobacco. He reports that he does not currently use alcohol after a past usage of about 57.0 - 58.0 standard drinks of alcohol per week. He reports that he does not use drugs.  Allergies:  Allergies  Allergen Reactions   Oxaliplatin Itching    No medications prior to admission.    No results found for this or any previous visit (from the past 48 hour(s)). No results found.  Review of Systems  Constitutional: Negative.   HENT: Negative.    Eyes: Negative.   Respiratory: Negative.    Cardiovascular: Negative.   Gastrointestinal: Negative.   Endocrine: Negative.   Genitourinary: Negative.   Musculoskeletal: Negative.   Allergic/Immunologic: Negative.   Neurological: Negative.   Hematological: Negative.   Psychiatric/Behavioral: Negative.      There  were no vitals taken for this visit. Physical Exam Vitals reviewed.  Constitutional:      Appearance: Normal appearance. He is not ill-appearing.  HENT:     Head: Normocephalic and atraumatic.  Cardiovascular:     Rate and Rhythm: Normal rate and regular rhythm.     Heart sounds: Normal heart sounds. No murmur heard.    No friction rub. No  gallop.  Pulmonary:     Effort: Pulmonary effort is normal. No respiratory distress.     Breath sounds: Normal breath sounds. No stridor. No wheezing, rhonchi or rales.     Comments: Port-A-Cath in place left upper chest. Skin:    General: Skin is warm and dry.  Neurological:     Mental Status: He is alert and oriented to person, place, and time.      Assessment/Plan Impression: Gastric carcinoma, finished with chemotherapy Plan: Patient is scheduled for Port-A-Cath removal in the minor procedure room on 03/02/2022.  The risks and benefits of the procedure were fully explained to the patient, who gave informed consent.  Aviva Signs, MD 02/26/2022, 11:19 AM

## 2022-02-28 ENCOUNTER — Other Ambulatory Visit: Payer: Self-pay | Admitting: Nurse Practitioner

## 2022-03-02 ENCOUNTER — Other Ambulatory Visit: Payer: Self-pay

## 2022-03-02 ENCOUNTER — Encounter (HOSPITAL_COMMUNITY)
Admission: RE | Disposition: A | Discharge status: Home / Self Care | Payer: Self-pay | Source: Home / Self Care | Attending: General Surgery

## 2022-03-02 ENCOUNTER — Ambulatory Visit (HOSPITAL_COMMUNITY)
Admission: RE | Admit: 2022-03-02 | Discharge: 2022-03-02 | Disposition: A | Discharge status: Home / Self Care | Payer: Self-pay | Attending: General Surgery | Admitting: General Surgery

## 2022-03-02 ENCOUNTER — Encounter (HOSPITAL_COMMUNITY): Payer: Self-pay | Admitting: General Surgery

## 2022-03-02 DIAGNOSIS — Z9221 Personal history of antineoplastic chemotherapy: Secondary | ICD-10-CM | POA: Insufficient documentation

## 2022-03-02 DIAGNOSIS — C169 Malignant neoplasm of stomach, unspecified: Secondary | ICD-10-CM | POA: Diagnosis not present

## 2022-03-02 DIAGNOSIS — Z452 Encounter for adjustment and management of vascular access device: Secondary | ICD-10-CM | POA: Insufficient documentation

## 2022-03-02 HISTORY — PX: PORT-A-CATH REMOVAL: SHX5289

## 2022-03-02 SURGERY — MINOR REMOVAL PORT-A-CATH
Anesthesia: LOCAL | Site: Chest | Laterality: Left

## 2022-03-02 MED ORDER — CHLORHEXIDINE GLUCONATE CLOTH 2 % EX PADS: 6.0000 | MEDICATED_PAD | Freq: Once | CUTANEOUS | Status: DC

## 2022-03-02 MED ORDER — LIDOCAINE HCL (PF) 1 % IJ SOLN
INTRAMUSCULAR | Status: DC | PRN
  Administered 2022-03-02: 5 mL

## 2022-03-02 MED ORDER — LIDOCAINE HCL (PF) 1 % IJ SOLN
INTRAMUSCULAR | Status: AC
  Filled 2022-03-02: qty 30

## 2022-03-02 SURGICAL SUPPLY — 24 items
ADH SKN CLS APL DERMABOND .7 (GAUZE/BANDAGES/DRESSINGS) ×1
APL PRP STRL LF ISPRP CHG 10.5 (MISCELLANEOUS) ×1
APPLICATOR CHLORAPREP 10.5 ORG (MISCELLANEOUS) ×2 IMPLANT
CLOTH BEACON ORANGE TIMEOUT ST (SAFETY) ×2 IMPLANT
DECANTER SPIKE VIAL GLASS SM (MISCELLANEOUS) ×2 IMPLANT
DERMABOND ADVANCED (GAUZE/BANDAGES/DRESSINGS) ×1
DERMABOND ADVANCED .7 DNX12 (GAUZE/BANDAGES/DRESSINGS) ×1 IMPLANT
DRAPE HALF SHEET 40X57 (DRAPES) ×1 IMPLANT
ELECT REM PT RETURN 9FT ADLT (ELECTROSURGICAL)
ELECTRODE REM PT RTRN 9FT ADLT (ELECTROSURGICAL) ×1 IMPLANT
GAUZE SPONGE 4X4 12PLY STRL LF (GAUZE/BANDAGES/DRESSINGS) ×1 IMPLANT
GLOVE BIOGEL PI IND STRL 7.0 (GLOVE) ×2 IMPLANT
GLOVE BIOGEL PI INDICATOR 7.0 (GLOVE) ×2
GLOVE SURG SS PI 7.5 STRL IVOR (GLOVE) ×2 IMPLANT
GOWN STRL REUS W/TWL LRG LVL3 (GOWN DISPOSABLE) IMPLANT
NDL HYPO 25X1 1.5 SAFETY (NEEDLE) ×1 IMPLANT
NEEDLE HYPO 25X1 1.5 SAFETY (NEEDLE) ×2 IMPLANT
PENCIL SMOKE EVACUATOR COATED (MISCELLANEOUS) IMPLANT
SPONGE GAUZE 2X2 8PLY STRL LF (GAUZE/BANDAGES/DRESSINGS) ×1 IMPLANT
SUT MNCRL AB 4-0 PS2 18 (SUTURE) ×2 IMPLANT
SUT VIC AB 3-0 SH 27 (SUTURE) ×2
SUT VIC AB 3-0 SH 27X BRD (SUTURE) ×1 IMPLANT
SYR CONTROL 10ML LL (SYRINGE) ×2 IMPLANT
TOWEL OR 17X26 4PK STRL BLUE (TOWEL DISPOSABLE) ×2 IMPLANT

## 2022-03-02 NOTE — Interval H&P Note (Signed)
History and Physical Interval Note:  03/02/2022 7:22 AM  Steven Martinez  has presented today for surgery, with the diagnosis of PORT IN PLACE.  The various methods of treatment have been discussed with the patient and family. After consideration of risks, benefits and other options for treatment, the patient has consented to  Procedure(s) with comments: MINOR REMOVAL PORT-A-CATH (N/A) - pt to arrive at 6:30am as a surgical intervention.  The patient's history has been reviewed, patient examined, no change in status, stable for surgery.  I have reviewed the patient's chart and labs.  Questions were answered to the patient's satisfaction.     Aviva Signs

## 2022-03-02 NOTE — Op Note (Signed)
Patient:  Steven Martinez  DOB:  May 09, 1991  MRN:  [id number]   Preop Diagnosis: Gastric carcinoma, finished with chemotherapy  Postop Diagnosis: Same  Procedure: Port-A-Cath removal  Surgeon: Aviva Signs, MD  Anes: Local  Indications: Patient is a 31 year old Hispanic male who has finished with chemotherapy for gastric carcinoma.  The risks and benefits of the procedure including bleeding and infection were fully explained to the patient, who gave informed consent.  Procedure note: The patient was placed in supine position in the minor procedure room.  The left upper chest was prepped and draped using usual sterile technique with ChloraPrep.  Surgical site confirmation was performed.  1% Xylocaine was used for local anesthesia.  An incision was made through the previous Port-A-Cath incision site.  The Port-A-Cath was removed in total without difficulty.  The subcutaneous layer was reapproximated using 3-0 Vicryl interrupted sutures.  The skin was closed using a 4-0 Monocryl subcuticular suture.  Dermabond was applied.  All tape and needle counts were correct at the end of the procedure.  The patient tolerated the procedure well.  The patient was discharged from the minor procedure room in good condition.  Complications: None  EBL: Minimal  Specimen: None

## 2022-03-03 ENCOUNTER — Encounter (HOSPITAL_COMMUNITY): Payer: Self-pay | Admitting: General Surgery

## 2022-04-17 ENCOUNTER — Encounter (HOSPITAL_COMMUNITY): Payer: Self-pay

## 2022-04-17 NOTE — Progress Notes (Signed)
Patient's wife called stating that patient has had diarrhea for the past week but today he has complained with abdominal pain to the point of bending over double and had to leave work. Denies fever and nausea or vomiting.   Tarri Abernethy, PA-C made aware of patients symptoms. She recommends that he be seen in the ED. Patient's wife made aware and is agreeable with plan.

## 2022-06-08 ENCOUNTER — Encounter (HOSPITAL_COMMUNITY): Payer: Self-pay | Admitting: Hematology

## 2022-06-11 ENCOUNTER — Ambulatory Visit (HOSPITAL_COMMUNITY)
Admission: RE | Admit: 2022-06-11 | Discharge: 2022-06-11 | Disposition: A | Discharge status: Home / Self Care | Payer: 59 | Source: Ambulatory Visit | Attending: Hematology | Admitting: Hematology

## 2022-06-11 ENCOUNTER — Inpatient Hospital Stay: Payer: 59 | Attending: Hematology

## 2022-06-11 DIAGNOSIS — R634 Abnormal weight loss: Secondary | ICD-10-CM | POA: Diagnosis not present

## 2022-06-11 DIAGNOSIS — G629 Polyneuropathy, unspecified: Secondary | ICD-10-CM | POA: Insufficient documentation

## 2022-06-11 DIAGNOSIS — C163 Malignant neoplasm of pyloric antrum: Secondary | ICD-10-CM

## 2022-06-11 DIAGNOSIS — C169 Malignant neoplasm of stomach, unspecified: Secondary | ICD-10-CM

## 2022-06-11 DIAGNOSIS — R911 Solitary pulmonary nodule: Secondary | ICD-10-CM | POA: Diagnosis not present

## 2022-06-11 DIAGNOSIS — I700 Atherosclerosis of aorta: Secondary | ICD-10-CM | POA: Diagnosis not present

## 2022-06-11 DIAGNOSIS — Z903 Acquired absence of stomach [part of]: Secondary | ICD-10-CM | POA: Diagnosis not present

## 2022-06-11 DIAGNOSIS — Z9889 Other specified postprocedural states: Secondary | ICD-10-CM | POA: Diagnosis not present

## 2022-06-11 LAB — CBC WITH DIFFERENTIAL/PLATELET
Abs Immature Granulocytes: 0.01 10*3/uL (ref 0.00–0.07)
Basophils Absolute: 0.1 10*3/uL (ref 0.0–0.1)
Basophils Relative: 1 %
Eosinophils Absolute: 0.1 10*3/uL (ref 0.0–0.5)
Eosinophils Relative: 1 %
HCT: 47.4 % (ref 39.0–52.0)
Hemoglobin: 16.3 g/dL (ref 13.0–17.0)
Immature Granulocytes: 0 %
Lymphocytes Relative: 26 %
Lymphs Abs: 1.7 10*3/uL (ref 0.7–4.0)
MCH: 28.8 pg (ref 26.0–34.0)
MCHC: 34.4 g/dL (ref 30.0–36.0)
MCV: 83.7 fL (ref 80.0–100.0)
Monocytes Absolute: 0.3 10*3/uL (ref 0.1–1.0)
Monocytes Relative: 5 %
Neutro Abs: 4.3 10*3/uL (ref 1.7–7.7)
Neutrophils Relative %: 67 %
Platelets: 226 10*3/uL (ref 150–400)
RBC: 5.66 MIL/uL (ref 4.22–5.81)
RDW: 12.3 % (ref 11.5–15.5)
WBC: 6.5 10*3/uL (ref 4.0–10.5)
nRBC: 0 % (ref 0.0–0.2)

## 2022-06-11 LAB — COMPREHENSIVE METABOLIC PANEL
ALT: 44 U/L (ref 0–44)
AST: 31 U/L (ref 15–41)
Albumin: 4.5 g/dL (ref 3.5–5.0)
Alkaline Phosphatase: 105 U/L (ref 38–126)
Anion gap: 9 (ref 5–15)
BUN: 14 mg/dL (ref 6–20)
CO2: 26 mmol/L (ref 22–32)
Calcium: 9.2 mg/dL (ref 8.9–10.3)
Chloride: 102 mmol/L (ref 98–111)
Creatinine, Ser: 0.8 mg/dL (ref 0.61–1.24)
GFR, Estimated: >60 mL/min (ref >60)
Glucose, Bld: 92 mg/dL (ref 70–99)
Potassium: 3.9 mmol/L (ref 3.5–5.1)
Sodium: 137 mmol/L (ref 135–145)
Total Bilirubin: 0.7 mg/dL (ref 0.3–1.2)
Total Protein: 8.4 g/dL — ABNORMAL HIGH (ref 6.5–8.1)

## 2022-06-11 LAB — VITAMIN B12: Vitamin B-12: 939 pg/mL — ABNORMAL HIGH (ref 180–914)

## 2022-06-11 LAB — IRON AND TIBC
Iron: 71 ug/dL (ref 45–182)
Saturation Ratios: 20 % (ref 17.9–39.5)
TIBC: 357 ug/dL (ref 250–450)
UIBC: 286 ug/dL

## 2022-06-11 LAB — FERRITIN: Ferritin: 116 ng/mL (ref 24–336)

## 2022-06-11 LAB — VITAMIN D 25 HYDROXY (VIT D DEFICIENCY, FRACTURES): Vit D, 25-Hydroxy: 22.7 ng/mL — ABNORMAL LOW (ref 30–100)

## 2022-06-11 MED ORDER — IOHEXOL 300 MG/ML  SOLN
100.0000 mL | Freq: Once | INTRAMUSCULAR | Status: AC | PRN
  Administered 2022-06-11: 100 mL via INTRAVENOUS

## 2022-06-17 ENCOUNTER — Other Ambulatory Visit: Payer: 59

## 2022-06-18 ENCOUNTER — Inpatient Hospital Stay (HOSPITAL_BASED_OUTPATIENT_CLINIC_OR_DEPARTMENT_OTHER): Payer: 59 | Admitting: Hematology

## 2022-06-18 VITALS — BP 137/89 | HR 78 | Temp 98.2°F | Resp 18 | Wt 155.9 lb

## 2022-06-18 DIAGNOSIS — G629 Polyneuropathy, unspecified: Secondary | ICD-10-CM | POA: Diagnosis not present

## 2022-06-18 DIAGNOSIS — D509 Iron deficiency anemia, unspecified: Secondary | ICD-10-CM

## 2022-06-18 DIAGNOSIS — R911 Solitary pulmonary nodule: Secondary | ICD-10-CM | POA: Diagnosis not present

## 2022-06-18 DIAGNOSIS — C169 Malignant neoplasm of stomach, unspecified: Secondary | ICD-10-CM | POA: Diagnosis not present

## 2022-06-18 DIAGNOSIS — C163 Malignant neoplasm of pyloric antrum: Secondary | ICD-10-CM | POA: Diagnosis not present

## 2022-06-18 DIAGNOSIS — R634 Abnormal weight loss: Secondary | ICD-10-CM | POA: Diagnosis not present

## 2022-06-18 NOTE — Progress Notes (Signed)
[address], [place] [address]   CLINIC:  Medical Oncology/Hematology  PCP:  Olga Coaster, FNP [id number] / [place] [address] [phone]   REASON FOR VISIT:  Follow-up for adenocarcinoma of the stomach  PRIOR THERAPY: Gastroesophageal FLOT every 2 weeks for 4 cycles  NGS Results: not done  CURRENT THERAPY: surveillance  BRIEF ONCOLOGIC HISTORY:  Oncology History  Gastric cancer ([place])  06/19/2021 Initial Diagnosis   Gastric cancer ([place])   06/25/2021 - 10/03/2021 Chemotherapy   Patient is on Treatment Plan : GASTROESOPHAGEAL FLOT q14d X 4 cycles       CANCER STAGING:  Cancer Staging  Gastric cancer ([place]) Staging form: Stomach, AJCC 8th Edition - Clinical stage from 06/19/2021: Stage III (cT3, cN1, cM0) - Unsigned - Pathologic stage from 12/02/2021: Stage I (ypT2, pN0, cM0) - Unsigned   INTERVAL HISTORY:  [name], a 31 y.o. male, seen for follow-up of gastric cancer.  He denies any tingling or numbness in extremities.  No new onset pains.  He is working full-time job.  Energy levels are 100%.  He was taking bariatric vitamin Gummies and could not find them anymore in the drugstore and stopped taking them 2 weeks ago.  REVIEW OF SYSTEMS:  Review of Systems  All other systems reviewed and are negative.   PAST MEDICAL/SURGICAL HISTORY:  Past Medical History:  Diagnosis Date   Acute upper GI bleed 10/13/2020   gastric ulcer with visible vessel   Alcohol abuse 10/13/2020   Quit Jan 2022   Cancer [facility])    GERD (gastroesophageal reflux disease)    Port-A-Cath in place 06/23/2021   Positive H. pylori test 09/2020   H pylori IgG + Jan 2022 s/p treatment with Prevpac. EGD March 2022 with gastric biopsy negative for H. pylori.    Past Surgical History:  Procedure Laterality Date   BIOPSY  12/03/2020   Procedure: BIOPSY;  Surgeon: Eloise Harman, DO;  [facility];  Service: Endoscopy;;   BIOPSY   05/12/2021   Procedure: BIOPSY;  Surgeon: Eloise Harman, DO;  [facility];  Service: Endoscopy;;   ESOPHAGOGASTRODUODENOSCOPY (EGD) WITH PROPOFOL N/A 10/14/2020    Surgeon: Eloise Harman, DO;  one non-bleeding, non-obstructing gastric ulcer with visible vessel s/p bipolar cautery, normal examined esophagus and duodenum.    ESOPHAGOGASTRODUODENOSCOPY (EGD) WITH PROPOFOL N/A 12/03/2020    Surgeon: Eloise Harman, DO;   nonobstructing, nonbleeding gastric ulcer with clean base s/p biopsied, gastritis biopsied, normal examined duodenum.  Pathology with granulation tissue consistent with ulcer, mild chronic gastritis with focal intestinal metaplasia, H. pylori negative.  Repeat in 3 months.   ESOPHAGOGASTRODUODENOSCOPY (EGD) WITH PROPOFOL N/A 05/12/2021   Procedure: ESOPHAGOGASTRODUODENOSCOPY (EGD) WITH PROPOFOL;  Surgeon: Eloise Harman, DO;  [facility];  Service: Endoscopy;  Laterality: N/A;  10:30am   ESOPHAGOGASTRODUODENOSCOPY (EGD) WITH PROPOFOL N/A 06/19/2021   Procedure: ESOPHAGOGASTRODUODENOSCOPY (EGD) WITH PROPOFOL;  Surgeon: [name], MD;  [facility];  Service: Endoscopy;  Laterality: N/A;   EUS N/A 06/19/2021   Procedure: UPPER ENDOSCOPIC ULTRASOUND (EUS) RADIAL;  Surgeon: [name], MD;  Location: [facility];  Service: Endoscopy;  Laterality: N/A;   GASTRECTOMY N/A 11/03/2021   Procedure: PARTIAL GASTRECTOMY;  Surgeon: Aviva Signs, MD;  Location: [facility];  Service: General;  Laterality: N/A;   PORT-A-CATH REMOVAL Left 03/02/2022   Procedure: MINOR REMOVAL PORT-A-CATH;  Surgeon: Aviva Signs, MD;  Location: [facility];  Service: General;  Laterality: Left;  pt to arrive at 6:30am   PORTACATH PLACEMENT Left 06/23/2021   Procedure: INSERTION PORT-A-CATH;  Surgeon: Aviva Signs, MD;  Location: [facility];  Service: General;  Laterality: Left;    SOCIAL HISTORY:  Social History   Socioeconomic History   Marital status: Married    Spouse  name: Not on file   Number of children: Not on file   Years of education: Not on file   Highest education level: Not on file  Occupational History   Not on file  Tobacco Use   Smoking status: Never   Smokeless tobacco: Never  Vaping Use   Vaping Use: Never used  Substance and Sexual Activity   Alcohol use: Not Currently    Alcohol/week: 57.0 - 58.0 standard drinks of alcohol    Types: 54 Cans of beer, 3 - 4 Shots of liquor per week    Comment: Quit January 2022. Used to drink sixpack daily and 12-15 beers on Saturdays and Sundays.    Drug use: Never   Sexual activity: Yes  Other Topics Concern   Not on file  Social History Narrative   Not on file   Social Determinants of Health   Financial Resource Strain: High Risk (07/08/2021)   Overall Financial Resource Strain (CARDIA)    Difficulty of Paying Living Expenses: Very hard  Food Insecurity: No Food Insecurity (05/20/2021)   Hunger Vital Sign    Worried About Running Out of Food in the Last Year: Never true    Ran Out of Food in the Last Year: Never true  Transportation Needs: No Transportation Needs (05/20/2021)   PRAPARE - Hydrologist (Medical): No    Lack of Transportation (Non-Medical): No  Physical Activity: Sufficiently Active (05/20/2021)   Exercise Vital Sign    Days of Exercise per Week: 5 days    Minutes of Exercise per Session: 60 min  Stress: No Stress Concern Present (05/20/2021)   [place]    Feeling of Stress : Not at all  Social Connections: Moderately Isolated (05/20/2021)   Social Connection and Isolation Panel [NHANES]    Frequency of Communication with Friends and Family: More than three times a week    Frequency of Social Gatherings with Friends and Family: More than three times a week    Attends Religious Services: Never    Marine scientist or Organizations: No    Attends Archivist  Meetings: Never    Marital Status: Married  Human resources officer Violence: Not At Risk (05/20/2021)   Humiliation, Afraid, Rape, and Kick questionnaire    Fear of Current or Ex-Partner: No    Emotionally Abused: No    Physically Abused: No    Sexually Abused: No    FAMILY HISTORY:  Family History  Problem Relation Age of Onset   Diabetes Mellitus II Mother    Hypertension Mother    Colon cancer Neg Hx    Colon polyps Neg Hx     CURRENT MEDICATIONS:  Current Outpatient Medications  Medication Sig Dispense Refill   bariatric multiple vitamin (ADVANCED MULTI EA) CHEW chewable tablet Chew 2 tablets by mouth daily.     Multiple Vitamin (MULTIVITAMIN WITH MINERALS) TABS tablet Take 1 tablet by mouth in the morning.     oxyCODONE (ROXICODONE) 5 MG immediate release tablet Take 1 tablet (5 mg total) by mouth every 8 (eight)  hours as needed for severe pain. 60 tablet 0   pantoprazole (PROTONIX) 40 MG tablet Take 1 tablet (40 mg total) by mouth daily. 1 tablet 30 tablet 3   No current facility-administered medications for this visit.    ALLERGIES:  Allergies  Allergen Reactions   Oxaliplatin Itching    PHYSICAL EXAM:  Performance status (ECOG): 0 - Asymptomatic  There were no vitals filed for this visit. Wt Readings from Last 3 Encounters:  02/10/22 143 lb 4.8 oz (65 kg)  12/04/21 137 lb (62.1 kg)  12/02/21 139 lb (63 kg)   Physical Exam Vitals reviewed.  Constitutional:      Appearance: Normal appearance.  Cardiovascular:     Rate and Rhythm: Normal rate and regular rhythm.     Pulses: Normal pulses.     Heart sounds: Normal heart sounds.  Pulmonary:     Effort: Pulmonary effort is normal.     Breath sounds: Normal breath sounds.  Abdominal:     General: A surgical scar is present.  Neurological:     General: No focal deficit present.     Mental Status: He is alert and oriented to person, place, and time.  Psychiatric:        Mood and Affect: Mood normal.         Behavior: Behavior normal.     LABORATORY DATA:  I have reviewed the labs as listed.     Latest Ref Rng & Units 06/11/2022    2:29 PM 02/05/2022    1:13 PM 12/02/2021    1:53 PM  CBC  WBC 4.0 - 10.5 K/uL 6.5  5.3  3.0   Hemoglobin 13.0 - 17.0 g/dL 16.3  14.9  14.5   Hematocrit 39.0 - 52.0 % 47.4  44.2  44.4   Platelets 150 - 400 K/uL 226  177  154       Latest Ref Rng & Units 06/11/2022    2:29 PM 02/05/2022    1:13 PM 12/02/2021    1:53 PM  CMP  Glucose 70 - 99 mg/dL 92  135  121   BUN 6 - 20 mg/dL '14  16  13   '$ Creatinine 0.61 - 1.24 mg/dL 0.80  1.05  0.74   Sodium 135 - 145 mmol/L 137  138  139   Potassium 3.5 - 5.1 mmol/L 3.9  3.8  3.9   Chloride 98 - 111 mmol/L 102  106  106   CO2 22 - 32 mmol/L '26  26  27   '$ Calcium 8.9 - 10.3 mg/dL 9.2  8.9  9.0   Total Protein 6.5 - 8.1 g/dL 8.4  7.9  7.7   Total Bilirubin 0.3 - 1.2 mg/dL 0.7  <0.1  0.5   Alkaline Phos 38 - 126 U/L 105  94  91   AST 15 - 41 U/L 31  24  33   ALT 0 - 44 U/L 44  35  55     DIAGNOSTIC IMAGING:  I have independently reviewed the scans and discussed with the patient. CT CHEST ABDOMEN PELVIS W CONTRAST  Result Date: 06/12/2022 CLINICAL DATA:  Gastric cancer, monitor.  * Tracking Code: BO * EXAM: CT CHEST, ABDOMEN, AND PELVIS WITH CONTRAST TECHNIQUE: Multidetector CT imaging of the chest, abdomen and pelvis was performed following the standard protocol during bolus administration of intravenous contrast. RADIATION DOSE REDUCTION: This exam was performed according to the departmental dose-optimization program which includes automated exposure control, adjustment of the mA  and/or kV according to patient size and/or use of iterative reconstruction technique. CONTRAST:  135m OMNIPAQUE IOHEXOL 300 MG/ML  SOLN COMPARISON:  CT Feb 05, 2022. FINDINGS: CT CHEST FINDINGS Cardiovascular: Aortic atherosclerosis. No central pulmonary embolus on this nondedicated study. Interval removal of the left chest Port-A-Cath. Normal size  heart. No significant pericardial effusion/thickening. Mediastinum/Nodes: No supraclavicular adenopathy. Bilateral adrenal glands appear normal. Prominent mediastinal lymph nodes are unchanged from prior examinations. No pathologically enlarged mediastinal, hilar or axillary lymph nodes. Stable ill-defined soft tissue in the anterior mediastinum most consistent with thymic remnant. Esophagus is grossly unremarkable. Lungs/Pleura: Stable 3 mm right middle lobe pulmonary nodule on image 41/4. No suspicious pulmonary nodules or masses. No pleural effusion. No pneumothorax. Musculoskeletal: No chest wall mass or suspicious bone lesions identified. CT ABDOMEN PELVIS FINDINGS Hepatobiliary: No suspicious hepatic lesion. Gallbladder is unremarkable. No biliary ductal dilation. Pancreas: No pancreatic ductal dilation or evidence of acute inflammation. Spleen: No splenomegaly or focal splenic lesion. Adrenals/Urinary Tract: Bilateral adrenal glands appear normal. No hydronephrosis. Kidneys demonstrate symmetric enhancement. Urinary bladder is unremarkable for degree of distension. Stomach/Bowel: Radiopaque enteric contrast material traverses splenic flexure. Prior partial gastrectomy without suspicious nodularity along the anastomotic suture lines. Interval resolution of the previously identified small bowel small bowel intussusception. No evidence of bowel obstruction or acute bowel inflammation. Vascular/Lymphatic: Normal caliber abdominal aorta. No pathologically enlarged abdominal or pelvic lymph nodes. Reproductive: Prostate is unremarkable. Other: No significant abdominopelvic free fluid. No discrete peritoneal or omental nodularity. Postsurgical change in the abdominal wall. Musculoskeletal: No aggressive lytic or blastic lesion of bone. IMPRESSION: 1. Stable examination post partial gastrectomy without evidence of new or progressive disease in the chest, abdomen or pelvis. 2. Stable 3 mm right middle lobe pulmonary  nodule, favored benign. Electronically Signed   By: JDahlia BailiffM.D.   On: 06/12/2022 15:32     ASSESSMENT:  1.  Gastric antral adenocarcinoma: - EGD on 05/12/2021 gastric antral mass with large cratered ulcer.  Normal duodenal bulb and first part of the duodenum. - Biopsy of the antrum ulcer consistent with adenocarcinoma, at least intramucosal.  Depth of invasion cannot be judged as the biopsy was superficial. - CT CAP on 05/14/2021 with gastric antral soft tissue thickening/mass measuring 3.7 x 3 cm.  1.2 cm portacaval lymph node.  Node within the perigastric fat anterior to the pylorus measures 5 mm.  No evidence of distant metastatic disease in the abdomen or pelvis.  Isolated 4 mm right middle lobe lung nodule most likely incidental/benign. - 13 pound weight loss in the last 3 months. - PET scan on 05/29/2021 showed mass in the gastric antrum FDG avid with SUV 5.32.  No definite evidence of FDG avid nodal metastasis or distant metastatic disease.  There is only low-level FDG uptake associated with previously described prominent upper abdominal lymph nodes. - EGD/EUS on 06/19/2021-uT3N1 4 cm cratered noncircumferential distal gastric adenocarcinoma.  There is one suspicious 9 mm perigastric lymph node nearby the primary gastric mass. - Partial gastrectomy and D1 dissection on 11/03/2021 by Dr. JArnoldo Morale - Pathology: 1.9 cm grade 2 adenocarcinoma, 0/19 lymph nodes involved, margins negative, ypT2 ypN0.  2.  Social/family history: - He is married and seen with his wife today. - He does cArchitectwork. - He quit smoking cigarettes 13 years ago.  Quit drinking alcohol in March 2022. - No family history of malignancies.   PLAN:  1.  Stage III (uT3 N1) gastric adenocarcinoma: - CT CAP (06/11/2022): Reviewed by me shows  no evidence of recurrence or metastatic disease.  Stable 3 mm right middle lobe pulmonary nodule favored to be benign. - Reviewed labs which showed normal LFTs and CBC. - Ferritin  and B12 are within normal limits. - He ran out of bariatric vitamin about 2 weeks ago and could not find it in the stores.  I have told him to start taking 500 mcg of vitamin B12 daily. - Recommend follow-up in 6 months with repeat CT CAP and labs including vitamin B12 and iron levels.  2.  Weight loss: - He is continuing to gain weight.  He gained 9 pounds.  3.  Peripheral neuropathy: - Numbness in the fingertips has completely resolved.  4.  Low vitamin D levels: - Vitamin D level is low at 22.7. - Recommend starting vitamin D 1000 units daily.   Orders placed this encounter:  No orders of the defined types were placed in this encounter.    Derek Jack, MD [place] [phone]

## 2022-06-18 NOTE — Patient Instructions (Addendum)
[facility]  Discharge Instructions  You were seen and examined today by Dr. Delton Coombes.  Dr. Delton Coombes discussed your most recent lab work which revealed that your Vitamin D and Vitamin B12 is low. All of the other labs and CT scan look really good.   Dr. Delton Coombes wants you to start taking Vitamin B12 500 mg and Vitamin D 1000 IU once daily.   Follow-up as scheduled in 6 months with labs and repeat CT scan.    Thank you for choosing [facility] to provide your oncology and hematology care.   To afford each patient quality time with our provider, please arrive at least 15 minutes before your scheduled appointment time. You may need to reschedule your appointment if you arrive late (10 or more minutes). Arriving late affects you and other patients whose appointments are after yours.  Also, if you miss three or more appointments without notifying the office, you may be dismissed from the clinic at the provider's discretion.    Again, thank you for choosing [facility].  Our hope is that these requests will decrease the amount of time that you wait before being seen by our physicians.   If you have a lab appointment with the [facility] please come in thru the Main Entrance and check in at the main information desk.           _____________________________________________________________  Should you have questions after your visit to [facility], please contact our office at [phone] and follow the prompts.  Our office hours are 8:00 a.m. to 4:30 p.m. Monday - Thursday and 8:00 a.m. to 2:30 p.m. Friday.  Please note that voicemails left after 4:00 p.m. may not be returned until the following business day.  We are closed weekends and all major holidays.  You do have access to a nurse 24-7, just call the main number to the clinic [phone] and do not press any options, hold on the line and a nurse will  answer the phone.    For prescription refill requests, have your pharmacy contact our office and allow 72 hours.    Masks are optional in the cancer centers. If you would like for your care team to wear a mask while they are taking care of you, please let them know. You may have one support person who is at least 31 years old accompany you for your appointments.

## 2022-07-17 ENCOUNTER — Encounter (HOSPITAL_COMMUNITY): Payer: Self-pay | Admitting: Hematology

## 2022-07-29 ENCOUNTER — Other Ambulatory Visit: Payer: Self-pay | Admitting: *Deleted

## 2022-07-29 MED ORDER — PANTOPRAZOLE SODIUM 40 MG PO TBEC
40.0000 mg | DELAYED_RELEASE_TABLET | Freq: Two times a day (BID) | ORAL | 3 refills | Status: DC
Start: 2022-07-29 — End: 2022-12-17

## 2022-07-29 NOTE — Telephone Encounter (Signed)
Patient's wife called to advise that reflux is getting worse at night.  Per Dr. Delton Coombes, will increase Protonix to BID.  New script sent to [facility].

## 2022-12-14 ENCOUNTER — Telehealth: Payer: Self-pay | Admitting: *Deleted

## 2022-12-14 NOTE — Telephone Encounter (Signed)
Received call from wife [place] to advise that patient continues to have reflux, despite taking Protonix.  Per Dr. Delton Coombes, will add Pepcid 20 mg bid.  Patient aware.

## 2022-12-16 ENCOUNTER — Other Ambulatory Visit: Payer: Self-pay | Admitting: Hematology

## 2022-12-16 ENCOUNTER — Inpatient Hospital Stay: Payer: 59 | Attending: Hematology

## 2022-12-16 ENCOUNTER — Other Ambulatory Visit: Payer: 59

## 2022-12-16 DIAGNOSIS — G629 Polyneuropathy, unspecified: Secondary | ICD-10-CM | POA: Insufficient documentation

## 2022-12-16 DIAGNOSIS — C163 Malignant neoplasm of pyloric antrum: Secondary | ICD-10-CM

## 2022-12-16 DIAGNOSIS — D509 Iron deficiency anemia, unspecified: Secondary | ICD-10-CM

## 2022-12-16 DIAGNOSIS — Z85028 Personal history of other malignant neoplasm of stomach: Secondary | ICD-10-CM | POA: Insufficient documentation

## 2022-12-16 DIAGNOSIS — E559 Vitamin D deficiency, unspecified: Secondary | ICD-10-CM | POA: Insufficient documentation

## 2022-12-16 DIAGNOSIS — R634 Abnormal weight loss: Secondary | ICD-10-CM | POA: Insufficient documentation

## 2022-12-16 DIAGNOSIS — C169 Malignant neoplasm of stomach, unspecified: Secondary | ICD-10-CM

## 2022-12-16 LAB — CBC WITH DIFFERENTIAL/PLATELET
Abs Immature Granulocytes: 0.02 10*3/uL (ref 0.00–0.07)
Basophils Absolute: 0 10*3/uL (ref 0.0–0.1)
Basophils Relative: 1 %
Eosinophils Absolute: 0.1 10*3/uL (ref 0.0–0.5)
Eosinophils Relative: 2 %
HCT: 44.8 % (ref 39.0–52.0)
Hemoglobin: 15.4 g/dL (ref 13.0–17.0)
Immature Granulocytes: 0 %
Lymphocytes Relative: 35 %
Lymphs Abs: 1.7 10*3/uL (ref 0.7–4.0)
MCH: 28.8 pg (ref 26.0–34.0)
MCHC: 34.4 g/dL (ref 30.0–36.0)
MCV: 83.7 fL (ref 80.0–100.0)
Monocytes Absolute: 0.3 10*3/uL (ref 0.1–1.0)
Monocytes Relative: 6 %
Neutro Abs: 2.7 10*3/uL (ref 1.7–7.7)
Neutrophils Relative %: 56 %
Platelets: 190 10*3/uL (ref 150–400)
RBC: 5.35 MIL/uL (ref 4.22–5.81)
RDW: 12.5 % (ref 11.5–15.5)
WBC: 4.8 10*3/uL (ref 4.0–10.5)
nRBC: 0 % (ref 0.0–0.2)

## 2022-12-16 LAB — COMPREHENSIVE METABOLIC PANEL
ALT: 26 U/L (ref 0–44)
AST: 27 U/L (ref 15–41)
Albumin: 4.1 g/dL (ref 3.5–5.0)
Alkaline Phosphatase: 83 U/L (ref 38–126)
Anion gap: 6 (ref 5–15)
BUN: 18 mg/dL (ref 6–20)
CO2: 24 mmol/L (ref 22–32)
Calcium: 8.7 mg/dL — ABNORMAL LOW (ref 8.9–10.3)
Chloride: 107 mmol/L (ref 98–111)
Creatinine, Ser: 0.79 mg/dL (ref 0.61–1.24)
GFR, Estimated: >60 mL/min (ref >60)
Glucose, Bld: 108 mg/dL — ABNORMAL HIGH (ref 70–99)
Potassium: 3.7 mmol/L (ref 3.5–5.1)
Sodium: 137 mmol/L (ref 135–145)
Total Bilirubin: 0.3 mg/dL (ref 0.3–1.2)
Total Protein: 7.5 g/dL (ref 6.5–8.1)

## 2022-12-16 LAB — VITAMIN B12: Vitamin B-12: 1239 pg/mL — ABNORMAL HIGH (ref 180–914)

## 2022-12-16 LAB — IRON AND TIBC
Iron: 68 ug/dL (ref 45–182)
Saturation Ratios: 21 % (ref 17.9–39.5)
TIBC: 329 ug/dL (ref 250–450)
UIBC: 261 ug/dL

## 2022-12-16 LAB — VITAMIN D 25 HYDROXY (VIT D DEFICIENCY, FRACTURES): Vit D, 25-Hydroxy: 22.1 ng/mL — ABNORMAL LOW (ref 30–100)

## 2022-12-16 LAB — FERRITIN: Ferritin: 46 ng/mL (ref 24–336)

## 2022-12-17 ENCOUNTER — Encounter (HOSPITAL_COMMUNITY): Payer: Self-pay | Admitting: Hematology

## 2022-12-17 ENCOUNTER — Ambulatory Visit: Payer: 59 | Admitting: Hematology

## 2022-12-17 ENCOUNTER — Ambulatory Visit (HOSPITAL_COMMUNITY)
Admission: RE | Admit: 2022-12-17 | Discharge: 2022-12-17 | Disposition: A | Discharge status: Home / Self Care | Payer: 59 | Source: Ambulatory Visit | Attending: Hematology | Admitting: Hematology

## 2022-12-17 DIAGNOSIS — C169 Malignant neoplasm of stomach, unspecified: Secondary | ICD-10-CM

## 2022-12-17 DIAGNOSIS — C163 Malignant neoplasm of pyloric antrum: Secondary | ICD-10-CM

## 2022-12-17 DIAGNOSIS — D509 Iron deficiency anemia, unspecified: Secondary | ICD-10-CM

## 2022-12-17 MED ORDER — IOHEXOL 300 MG/ML  SOLN
100.0000 mL | Freq: Once | INTRAMUSCULAR | Status: AC | PRN
  Administered 2022-12-17: 100 mL via INTRAVENOUS

## 2022-12-18 ENCOUNTER — Encounter (HOSPITAL_COMMUNITY): Payer: Self-pay | Admitting: Hematology

## 2022-12-22 NOTE — Progress Notes (Signed)
[name] [address], [place] [address]    Clinic Day:  12/23/2022  Referring physician: Olga Coaster, FNP  Patient Care Team: Olga Coaster, FNP as PCP - General (Family Medicine) Eloise Harman, DO as Consulting Physician (Gastroenterology) Brien Mates, RN as Oncology Nurse Navigator (Oncology) Derek Jack, MD as Medical Oncologist (Oncology) Holley Dexter, RN as Registered Nurse (Oncology)   ASSESSMENT & PLAN:   Assessment: 1.  Gastric antral adenocarcinoma: - EGD on 05/12/2021 gastric antral mass with large cratered ulcer.  Normal duodenal bulb and first part of the duodenum. - Biopsy of the antrum ulcer consistent with adenocarcinoma, at least intramucosal.  Depth of invasion cannot be judged as the biopsy was superficial. - CT CAP on 05/14/2021 with gastric antral soft tissue thickening/mass measuring 3.7 x 3 cm.  1.2 cm portacaval lymph node.  Node within the perigastric fat anterior to the pylorus measures 5 mm.  No evidence of distant metastatic disease in the abdomen or pelvis.  Isolated 4 mm right middle lobe lung nodule most likely incidental/benign. - 13 pound weight loss in the last 3 months. - PET scan on 05/29/2021 showed mass in the gastric antrum FDG avid with SUV 5.32.  No definite evidence of FDG avid nodal metastasis or distant metastatic disease.  There is only low-level FDG uptake associated with previously described prominent upper abdominal lymph nodes. - EGD/EUS on 06/19/2021-uT3N1 4 cm cratered noncircumferential distal gastric adenocarcinoma.  There is one suspicious 9 mm perigastric lymph node nearby the primary gastric mass. - Partial gastrectomy and D1 dissection on 11/03/2021 by Dr. Arnoldo Morale. - Pathology: 1.9 cm grade 2 adenocarcinoma, 0/19 lymph nodes involved, margins negative, ypT2 ypN0.  2.  Social/family history: - He is married and seen with his wife today. - He does Architect work. - He quit smoking  cigarettes 13 years ago.  Quit drinking alcohol in March 2022. - No family history of malignancies.    Plan: 1.  Stage III (uT3 N1) gastric adenocarcinoma: - He is eating better.  He is also working full-time.  He gets tired at the end of the day after work. - Physical exam today did not reveal any suspicious findings. - Labs from 12/16/2022: LFTs are normal.  CBC is normal.  Ferritin is 46 and down from 116 on 06/11/2022.  If he feels extremely fatigued, he was told to call us.  Will check ferritin and see if he needs any intravenous iron.  He will have trouble absorbing iron after partial gastrectomy. - He has some acid reflux.  He is currently taking Protonix twice daily.  He cannot take Pepcid because of nausea.  Recommend using Maalox or Mylanta. - CT CAP (12/17/2022): Stable exam with no evidence of metastatic disease or recurrence.  Stable 3 mm right middle lobe pulmonary nodule favored benign. - RTC 6 months for follow-up with repeat CT scan and labs.  2.  Weight loss: - He is continuing to gain weight.  He has gained 15 pounds.  3.  Peripheral neuropathy: - Numbness in the fingertips has completely resolved.  4.  Low vitamin D levels: - He is reportedly taking vitamin D tablet daily.  Vitamin D is still low at 22. - Recommend increasing to 2 tablets daily.  Will check levels at next visit.  Orders Placed This Encounter  Procedures   CT CHEST ABDOMEN PELVIS W CONTRAST    Standing Status:   Future    Standing Expiration  Date:   12/23/2023    Order Specific Question:   If indicated for the ordered procedure, I authorize the administration of contrast media per Radiology protocol    Answer:   Yes    Order Specific Question:   Does the patient have a contrast media/X-ray dye allergy?    Answer:   No    Order Specific Question:   Preferred imaging location?    Answer:   [facility]    Order Specific Question:   Release to patient    Answer:   Immediate    Order Specific  Question:   Is Oral Contrast requested for this exam?    Answer:   Yes, Per Radiology protocol   CBC with Differential/Platelet    Standing Status:   Future    Standing Expiration Date:   12/23/2023    Order Specific Question:   Release to patient    Answer:   Immediate   Comprehensive metabolic panel    Standing Status:   Future    Standing Expiration Date:   12/23/2023    Order Specific Question:   Release to patient    Answer:   Immediate   VITAMIN D 25 Hydroxy (Vit-D Deficiency, Fractures)    Standing Status:   Future    Standing Expiration Date:   12/23/2023    Order Specific Question:   Release to patient    Answer:   Immediate   Vitamin B12    Standing Status:   Future    Standing Expiration Date:   12/23/2023    Order Specific Question:   Release to patient    Answer:   Immediate   CEA    Standing Status:   Future    Standing Expiration Date:   12/23/2023      I,Katie Daubenspeck,acting as a scribe for Derek Jack, MD.,have documented all relevant documentation on the behalf of Derek Jack, MD,as directed by  Derek Jack, MD while in the presence of Derek Jack, MD.   I, Derek Jack MD, have reviewed the above documentation for accuracy and completeness, and I agree with the above.   Derek Jack, MD   4/3/20244:14 PM  CHIEF COMPLAINT:   Diagnosis: adenocarcinoma of the stomach    Cancer Staging  Gastric cancer Staging form: Stomach, AJCC 8th Edition - Clinical stage from 06/19/2021: Stage III (cT3, cN1, cM0) - Unsigned - Pathologic stage from 12/02/2021: Stage I (ypT2, pN0, cM0) - Unsigned    Prior Therapy: Gastroesophageal FLOT every 2 weeks for 4 cycles   Current Therapy:  surveillance    HISTORY OF PRESENT ILLNESS:   Oncology History  Gastric cancer  06/19/2021 Initial Diagnosis   Gastric cancer ([place])   06/25/2021 - 10/03/2021 Chemotherapy   Patient is on Treatment Plan : GASTROESOPHAGEAL FLOT q14d X 4 cycles         INTERVAL HISTORY:   Steven Martinez is a 32 y.o. male presenting to clinic today for follow up of adenocarcinoma of the stomach . He was last seen by me on 06/18/22.  Since his last visit, he underwent surveillance CT C/A/P on 12/17/22 showing stable exam with no evidence of recurrent or metastatic disease.  Today, he states that he is doing well overall. His appetite level is at 100%. His energy level is at 75%.  PAST MEDICAL HISTORY:   Past Medical History: Past Medical History:  Diagnosis Date   Acute upper GI bleed 10/13/2020   gastric ulcer with visible vessel  Alcohol abuse 10/13/2020   Quit Jan 2022   Cancer    GERD (gastroesophageal reflux disease)    Port-A-Cath in place 06/23/2021   Positive H. pylori test 09/2020   H pylori IgG + Jan 2022 s/p treatment with Prevpac. EGD March 2022 with gastric biopsy negative for H. pylori.     Surgical History: Past Surgical History:  Procedure Laterality Date   BIOPSY  12/03/2020   Procedure: BIOPSY;  Surgeon: Eloise Harman, DO;  [facility];  Service: Endoscopy;;   BIOPSY  05/12/2021   Procedure: BIOPSY;  Surgeon: Eloise Harman, DO;  [facility];  Service: Endoscopy;;   ESOPHAGOGASTRODUODENOSCOPY (EGD) WITH PROPOFOL N/A 10/14/2020    Surgeon: Eloise Harman, DO;  one non-bleeding, non-obstructing gastric ulcer with visible vessel s/p bipolar cautery, normal examined esophagus and duodenum.    ESOPHAGOGASTRODUODENOSCOPY (EGD) WITH PROPOFOL N/A 12/03/2020    Surgeon: Eloise Harman, DO;   nonobstructing, nonbleeding gastric ulcer with clean base s/p biopsied, gastritis biopsied, normal examined duodenum.  Pathology with granulation tissue consistent with ulcer, mild chronic gastritis with focal intestinal metaplasia, H. pylori negative.  Repeat in 3 months.   ESOPHAGOGASTRODUODENOSCOPY (EGD) WITH PROPOFOL N/A 05/12/2021   Procedure: ESOPHAGOGASTRODUODENOSCOPY (EGD) WITH PROPOFOL;  Surgeon: Eloise Harman, DO;  [facility];  Service: Endoscopy;  Laterality: N/A;  10:30am   ESOPHAGOGASTRODUODENOSCOPY (EGD) WITH PROPOFOL N/A 06/19/2021   Procedure: ESOPHAGOGASTRODUODENOSCOPY (EGD) WITH PROPOFOL;  Surgeon: [name], MD;  Location: [facility];  Service: Endoscopy;  Laterality: N/A;   EUS N/A 06/19/2021   Procedure: UPPER ENDOSCOPIC ULTRASOUND (EUS) RADIAL;  Surgeon: [name], MD;  Location: [facility];  Service: Endoscopy;  Laterality: N/A;   GASTRECTOMY N/A 11/03/2021   Procedure: PARTIAL GASTRECTOMY;  Surgeon: Aviva Signs, MD;  Location: [facility];  Service: General;  Laterality: N/A;   PORT-A-CATH REMOVAL Left 03/02/2022   Procedure: MINOR REMOVAL PORT-A-CATH;  Surgeon: Aviva Signs, MD;  Location: [facility];  Service: General;  Laterality: Left;  pt to arrive at 6:30am   PORTACATH PLACEMENT Left 06/23/2021   Procedure: INSERTION PORT-A-CATH;  Surgeon: Aviva Signs, MD;  Location: [facility];  Service: General;  Laterality: Left;    Social History: Social History   Socioeconomic History   Marital status: Married    Spouse name: Not on file   Number of children: Not on file   Years of education: Not on file   Highest education level: Not on file  Occupational History   Not on file  Tobacco Use   Smoking status: Never   Smokeless tobacco: Never  Vaping Use   Vaping Use: Never used  Substance and Sexual Activity   Alcohol use: Not Currently    Alcohol/week: 57.0 - 58.0 standard drinks of alcohol    Types: 54 Cans of beer, 3 - 4 Shots of liquor per week    Comment: Quit January 2022. Used to drink sixpack daily and 12-15 beers on Saturdays and Sundays.    Drug use: Never   Sexual activity: Yes  Other Topics Concern   Not on file  Social History Narrative   Not on file   Social Determinants of Health   Financial Resource Strain: High Risk (07/08/2021)   Overall Financial Resource Strain (CARDIA)    Difficulty of Paying Living Expenses: Very hard  Food  Insecurity: No Food Insecurity (05/20/2021)   Hunger Vital Sign    Worried About Running Out of Food in the  Last Year: Never true    [place] in the Last Year: Never true  Transportation Needs: No Transportation Needs (05/20/2021)   PRAPARE - Hydrologist (Medical): No    Lack of Transportation (Non-Medical): No  Physical Activity: Sufficiently Active (05/20/2021)   Exercise Vital Sign    Days of Exercise per Week: 5 days    Minutes of Exercise per Session: 60 min  Stress: No Stress Concern Present (05/20/2021)   [place]    Feeling of Stress : Not at all  Social Connections: Moderately Isolated (05/20/2021)   Social Connection and Isolation Panel [NHANES]    Frequency of Communication with Friends and Family: More than three times a week    Frequency of Social Gatherings with Friends and Family: More than three times a week    Attends Religious Services: Never    Marine scientist or Organizations: No    Attends Archivist Meetings: Never    Marital Status: Married  Human resources officer Violence: Not At Risk (05/20/2021)   Humiliation, Afraid, Rape, and Kick questionnaire    Fear of Current or Ex-Partner: No    Emotionally Abused: No    Physically Abused: No    Sexually Abused: No    Family History: Family History  Problem Relation Age of Onset   Diabetes Mellitus II Mother    Hypertension Mother    Colon cancer Neg Hx    Colon polyps Neg Hx     Current Medications:  Current Outpatient Medications:    bariatric multiple vitamin (ADVANCED MULTI EA) CHEW chewable tablet, Chew 2 tablets by mouth daily., Disp: , Rfl:    Multiple Vitamin (MULTIVITAMIN WITH MINERALS) TABS tablet, Take 1 tablet by mouth in the morning., Disp: , Rfl:    oxyCODONE (ROXICODONE) 5 MG immediate release tablet, Take 1 tablet (5 mg total) by mouth every 8 (eight) hours as needed for severe  pain., Disp: 60 tablet, Rfl: 0   pantoprazole (PROTONIX) 40 MG tablet, Take 1 tablet by mouth twice daily, Disp: 60 tablet, Rfl: 0   Allergies: Allergies  Allergen Reactions   Oxaliplatin Itching    REVIEW OF SYSTEMS:   Review of Systems  Constitutional:  Negative for chills, fatigue and fever.  HENT:   Negative for lump/mass, mouth sores, nosebleeds, sore throat and trouble swallowing.   Eyes:  Negative for eye problems.  Respiratory:  Negative for cough and shortness of breath.   Cardiovascular:  Negative for chest pain, leg swelling and palpitations.  Gastrointestinal:  Negative for abdominal pain, constipation, diarrhea, nausea and vomiting.  Genitourinary:  Negative for bladder incontinence, difficulty urinating, dysuria, frequency, hematuria and nocturia.   Musculoskeletal:  Negative for arthralgias, back pain, flank pain, myalgias and neck pain.  Skin:  Negative for itching and rash.  Neurological:  Positive for numbness. Negative for dizziness and headaches.  Hematological:  Does not bruise/bleed easily.  Psychiatric/Behavioral:  Negative for depression, sleep disturbance and suicidal ideas. The patient is not nervous/anxious.   All other systems reviewed and are negative.    VITALS:   Blood pressure (!) 133/103, pulse 71, temperature (!) 97.5 F (36.4 C), temperature source Oral, resp. rate 18, weight 171 lb 6.4 oz (77.7 kg), SpO2 99 %.  Wt Readings from Last 3 Encounters:  12/23/22 171 lb 6.4 oz (77.7 kg)  06/18/22 155 lb 13.8 oz (70.7 kg)  02/10/22 143 lb 4.8 oz (65  kg)    Body mass index is 26.85 kg/m.  Performance status (ECOG): 0 - Asymptomatic  PHYSICAL EXAM:   Physical Exam Vitals and nursing note reviewed. Exam conducted with a chaperone present.  Constitutional:      Appearance: Normal appearance.  Cardiovascular:     Rate and Rhythm: Normal rate and regular rhythm.     Pulses: Normal pulses.     Heart sounds: Normal heart sounds.  Pulmonary:      Effort: Pulmonary effort is normal.     Breath sounds: Normal breath sounds.  Abdominal:     Palpations: Abdomen is soft. There is no hepatomegaly, splenomegaly or mass.     Tenderness: There is no abdominal tenderness.  Musculoskeletal:     Right lower leg: No edema.     Left lower leg: No edema.  Lymphadenopathy:     Cervical: No cervical adenopathy.     Right cervical: No superficial, deep or posterior cervical adenopathy.    Left cervical: No superficial, deep or posterior cervical adenopathy.     Upper Body:     Right upper body: No supraclavicular or axillary adenopathy.     Left upper body: No supraclavicular or axillary adenopathy.  Neurological:     General: No focal deficit present.     Mental Status: He is alert and oriented to person, place, and time.  Psychiatric:        Mood and Affect: Mood normal.        Behavior: Behavior normal.     LABS:      Latest Ref Rng & Units 12/16/2022    3:12 PM 06/11/2022    2:29 PM 02/05/2022    1:13 PM  CBC  WBC 4.0 - 10.5 K/uL 4.8  6.5  5.3   Hemoglobin 13.0 - 17.0 g/dL 15.4  16.3  14.9   Hematocrit 39.0 - 52.0 % 44.8  47.4  44.2   Platelets 150 - 400 K/uL 190  226  177       Latest Ref Rng & Units 12/16/2022    3:12 PM 06/11/2022    2:29 PM 02/05/2022    1:13 PM  CMP  Glucose 70 - 99 mg/dL 108  92  135   BUN 6 - 20 mg/dL 18  14  16    Creatinine 0.61 - 1.24 mg/dL 0.79  0.80  1.05   Sodium 135 - 145 mmol/L 137  137  138   Potassium 3.5 - 5.1 mmol/L 3.7  3.9  3.8   Chloride 98 - 111 mmol/L 107  102  106   CO2 22 - 32 mmol/L 24  26  26    Calcium 8.9 - 10.3 mg/dL 8.7  9.2  8.9   Total Protein 6.5 - 8.1 g/dL 7.5  8.4  7.9   Total Bilirubin 0.3 - 1.2 mg/dL 0.3  0.7  <0.1   Alkaline Phos 38 - 126 U/L 83  105  94   AST 15 - 41 U/L 27  31  24    ALT 0 - 44 U/L 26  44  35      Lab Results  Component Value Date   CEA1 1.7 12/02/2021   /  CEA  Date Value Ref Range Status  12/02/2021 1.7 0.0 - 4.7 ng/mL Final    Comment:     (NOTE)  Nonsmokers          <3.9                             Smokers             <5.6 Roche [employer]ostics Electrochemiluminescence Immunoassay (ECLIA) Values obtained with different assay methods or kits cannot be used interchangeably.  Results cannot be interpreted as absolute evidence of the presence or absence of malignant disease. Performed At: [facility] [place], [place] [id number] Rush Farmer MD [id number]    No results found for: "PSA1" No results found for: "CAN199" No results found for: "CAN125"  No results found for: "TOTALPROTELP", "ALBUMINELP", "A1GS", "A2GS", "BETS", "BETA2SER", "GAMS", "MSPIKE", "SPEI" Lab Results  Component Value Date   TIBC 329 12/16/2022   TIBC 357 06/11/2022   TIBC 319 02/05/2022   FERRITIN 46 12/16/2022   FERRITIN 116 06/11/2022   FERRITIN 86 02/05/2022   IRONPCTSAT 21 12/16/2022   IRONPCTSAT 20 06/11/2022   IRONPCTSAT 19 02/05/2022   No results found for: "LDH"   STUDIES:   CT CHEST ABDOMEN PELVIS W CONTRAST  Result Date: 12/17/2022 CLINICAL DATA:  History of gastric cancer, follow-up. Currently on chemotherapy infusions. * Tracking Code: BO *. EXAM: CT CHEST, ABDOMEN, AND PELVIS WITH CONTRAST TECHNIQUE: Multidetector CT imaging of the chest, abdomen and pelvis was performed following the standard protocol during bolus administration of intravenous contrast. RADIATION DOSE REDUCTION: This exam was performed according to the departmental dose-optimization program which includes automated exposure control, adjustment of the mA and/or kV according to patient size and/or use of iterative reconstruction technique. CONTRAST:  159mL OMNIPAQUE IOHEXOL 300 MG/ML  SOLN COMPARISON:  Multiple priors including most recent CT June 11, 2022 FINDINGS: CT CHEST FINDINGS Cardiovascular: Aortic atherosclerosis. Normal caliber thoracic aorta. No central pulmonary embolus on this nondedicated study.  Normal size heart. No significant pericardial effusion/thickening. Mediastinum/Nodes: Prominent mediastinal lymph nodes are unchanged from prior examinations. No pathologically enlarged mediastinal, hilar or axillary lymph nodes. Similar feathery ill-defined soft tissue in the anterior mediastinum which conforms to underlying vasculature compatible with thymic tissue. No suspicious thyroid nodule. The esophagus is grossly unremarkable. Lungs/Pleura: Stable 3 mm right middle lobe pulmonary nodule on image 94/3. No new suspicious pulmonary nodules or masses. Hypoventilatory change in the dependent lungs. No pleural effusion. No pneumothorax. Musculoskeletal: No aggressive lytic or blastic lesion of bone. CT ABDOMEN PELVIS FINDINGS Hepatobiliary: No suspicious hepatic lesion. Gallbladder is unremarkable. No biliary ductal dilation. Pancreas: No pancreatic ductal dilation or evidence of acute inflammation. Spleen: No splenomegaly. Adrenals/Urinary Tract: Bilateral adrenal glands appear normal. No hydronephrosis. Kidneys demonstrate symmetric enhancement. Urinary bladder is unremarkable for degree of distension. Stomach/Bowel: No radiopaque enteric contrast material was administered. Stomach is distended with ingested material and gas. Surgical changes of prior partial gastrectomy without suspicious soft tissue nodularity along the anastomotic suture line. Small bowel small bowel anastomosis in the anterior upper abdomen. No radiopaque enteric contrast material was administered. No evidence of acute bowel inflammation or bowel obstruction. Left-sided colonic diverticulosis without findings of acute diverticulitis. Vascular/Lymphatic: Normal caliber abdominal aorta. Smooth IVC contours. The portal, splenic and superior mesenteric veins are patent. No pathologically enlarged abdominal or pelvic lymph nodes. Reproductive: Prostate is unremarkable. Other: No significant abdominopelvic free fluid. Surgical change in the  abdominal wall. No discrete peritoneal or omental nodularity. Musculoskeletal: No aggressive lytic or blastic lesion of bone. IMPRESSION: 1. Stable examination status post partial gastrectomy  without evidence of recurrent or metastatic disease within the chest, abdomen, or pelvis. 2. Stable 3 mm right middle lobe pulmonary nodule favored benign. 3. Left-sided colonic diverticulosis without findings of acute diverticulitis. 4.  Aortic Atherosclerosis (ICD10-I70.0). Electronically Signed   By: Dahlia Bailiff M.D.   On: 12/17/2022 18:45

## 2022-12-23 ENCOUNTER — Encounter: Payer: Self-pay | Admitting: Hematology

## 2022-12-23 ENCOUNTER — Inpatient Hospital Stay: Payer: Self-pay | Attending: Hematology | Admitting: Hematology

## 2022-12-23 VITALS — BP 133/103 | HR 71 | Temp 97.5°F | Resp 18 | Wt 171.4 lb

## 2022-12-23 DIAGNOSIS — R634 Abnormal weight loss: Secondary | ICD-10-CM | POA: Insufficient documentation

## 2022-12-23 DIAGNOSIS — Z85028 Personal history of other malignant neoplasm of stomach: Secondary | ICD-10-CM | POA: Insufficient documentation

## 2022-12-23 DIAGNOSIS — G629 Polyneuropathy, unspecified: Secondary | ICD-10-CM | POA: Insufficient documentation

## 2022-12-23 DIAGNOSIS — Z8711 Personal history of peptic ulcer disease: Secondary | ICD-10-CM | POA: Insufficient documentation

## 2022-12-23 DIAGNOSIS — C169 Malignant neoplasm of stomach, unspecified: Secondary | ICD-10-CM

## 2022-12-23 DIAGNOSIS — E559 Vitamin D deficiency, unspecified: Secondary | ICD-10-CM | POA: Insufficient documentation

## 2022-12-23 DIAGNOSIS — Z79899 Other long term (current) drug therapy: Secondary | ICD-10-CM | POA: Insufficient documentation

## 2022-12-23 DIAGNOSIS — K219 Gastro-esophageal reflux disease without esophagitis: Secondary | ICD-10-CM | POA: Insufficient documentation

## 2022-12-23 NOTE — Patient Instructions (Addendum)
[facility]  Discharge Instructions  You were seen and examined today by Dr. Delton Coombes.  Dr. Delton Coombes discussed your most recent lab work and CT scan which revealed that everything looks good and stable. Your Iron levels have came down some and your Vitamin D is still low.  Vitamin D is still 22. Dr. Delton Coombes wants you to start taking 2 pills of the Vitamin D daily.  Avoid fatty foods, caffeine and don't eat before bed. Keep your head elevated in bed. You can take Mylanta to help as needed.  Follow-up as scheduled in 6 months with labs and scan prior.    Thank you for choosing [facility] to provide your oncology and hematology care.   To afford each patient quality time with our provider, please arrive at least 15 minutes before your scheduled appointment time. You may need to reschedule your appointment if you arrive late (10 or more minutes). Arriving late affects you and other patients whose appointments are after yours.  Also, if you miss three or more appointments without notifying the office, you may be dismissed from the clinic at the provider's discretion.    Again, thank you for choosing [facility].  Our hope is that these requests will decrease the amount of time that you wait before being seen by our physicians.   If you have a lab appointment with the [facility] - please note that after April 8th, all labs will be drawn in the cancer center.  You do not have to check in or register with the main entrance as you have in the past but will complete your check-in at the cancer center.            _____________________________________________________________  Should you have questions after your visit to [facility], please contact our office at [phone] and follow the prompts.  Our office hours are 8:00 a.m. to 4:30 p.m. Monday - Thursday and 8:00 a.m. to 2:30 p.m. Friday.  Please note  that voicemails left after 4:00 p.m. may not be returned until the following business day.  We are closed weekends and all major holidays.  You do have access to a nurse 24-7, just call the main number to the clinic [phone] and do not press any options, hold on the line and a nurse will answer the phone.    For prescription refill requests, have your pharmacy contact our office and allow 72 hours.    Masks are no longer required in the cancer centers. If you would like for your care team to wear a mask while they are taking care of you, please let them know. You may have one support person who is at least 32 years old accompany you for your appointments.

## 2023-01-18 ENCOUNTER — Other Ambulatory Visit: Payer: Self-pay | Admitting: Hematology

## 2023-01-19 ENCOUNTER — Other Ambulatory Visit: Payer: Self-pay | Admitting: *Deleted

## 2023-01-19 MED ORDER — PANTOPRAZOLE SODIUM 40 MG PO TBEC: 40.0000 mg | DELAYED_RELEASE_TABLET | Freq: Two times a day (BID) | ORAL | 0 refills | Status: DC

## 2023-03-14 IMAGING — PT NM PET TUM IMG INITIAL (PI) SKULL BASE T - THIGH
7 series · 24 of 25 positions shown · non-contrast
Comparison: 05/14/2021

CLINICAL DATA: Initial treatment strategy for gastric
adenocarcinoma.

EXAM:
NUCLEAR MEDICINE PET SKULL BASE TO THIGH
TECHNIQUE: 7.54 mCi F-18 FDG was injected intravenously. Full-ring PET imaging
was performed from the skull base to thigh after the radiotracer. CT
data was obtained and used for attenuation correction and anatomic
localization.
Fasting blood glucose: 124 mg/dl

[Series 3: ctac · axial · 3.0mm · 0.98mm/px · z∈[-922,-46]mm · 5 of 293 slices shown]
[im 1/293]
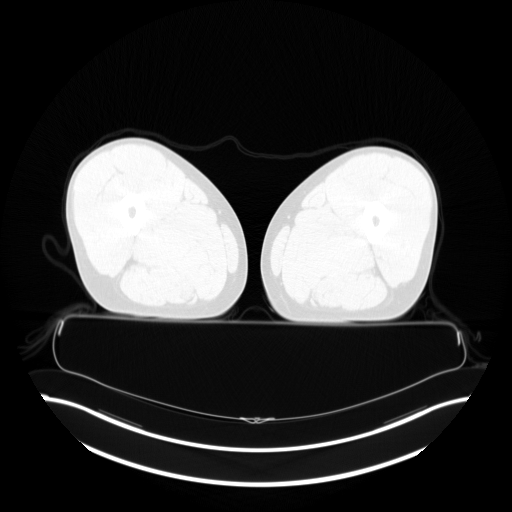
[im 74/293]
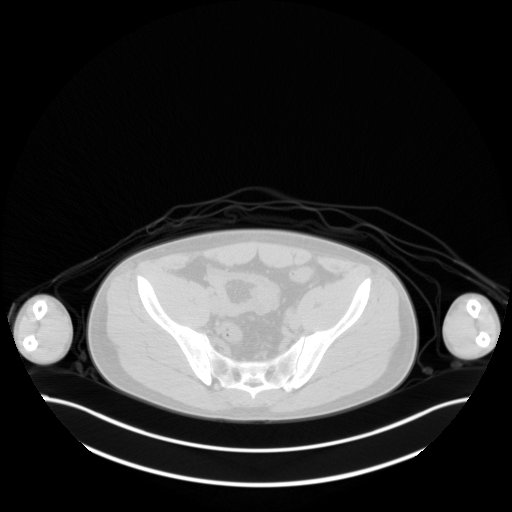
[im 147/293]
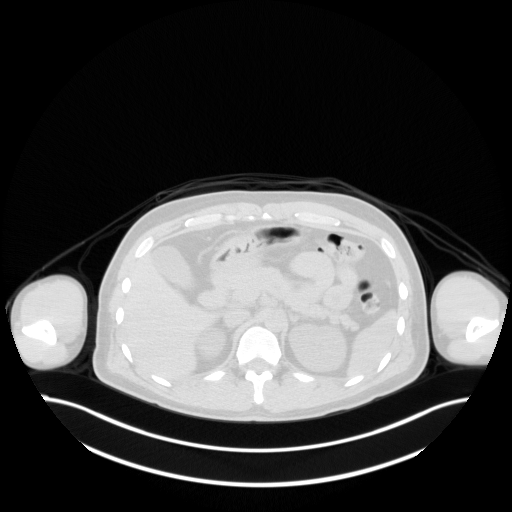
[im 220/293]
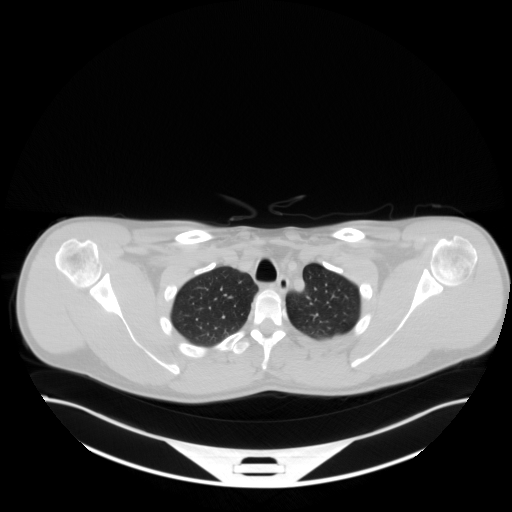
[im 293/293  brain]
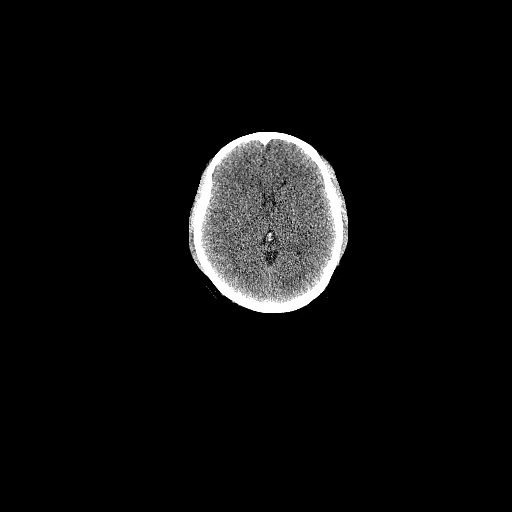

[Series 4: pet ac · axial · 5.0mm · 4.11mm/px · z∈[-921,-46]mm · 3 of 176 slices shown]
[im 1/176]
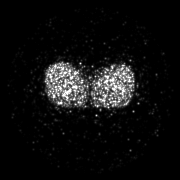
[im 88/176]
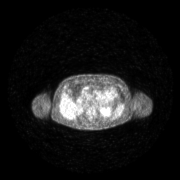
[im 176/176]
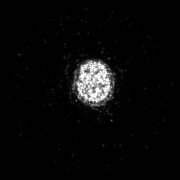

[Series 5: pet nac · axial · 5.0mm · 4.11mm/px · z∈[-921,-46]mm · 3 of 176 slices shown]
[im 1/176]
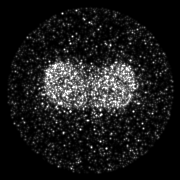
[im 88/176]
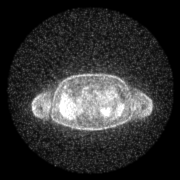
[im 176/176]
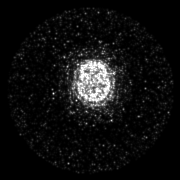

[Series 7: ct lung · axial · 3.0mm · 0.98mm/px · z∈[-491,-203]mm · 3 of 241 slices shown]
[im 1/241]
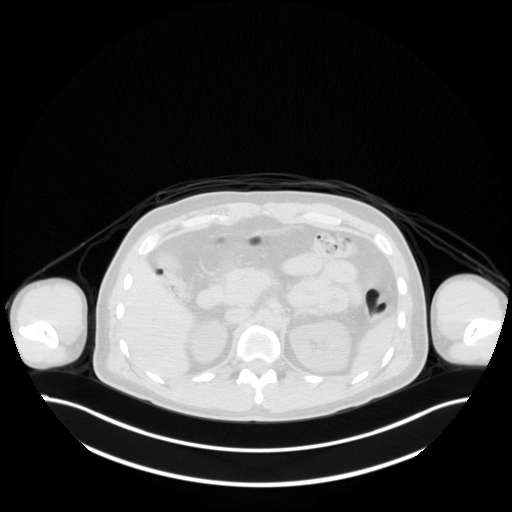
[im 161/241]
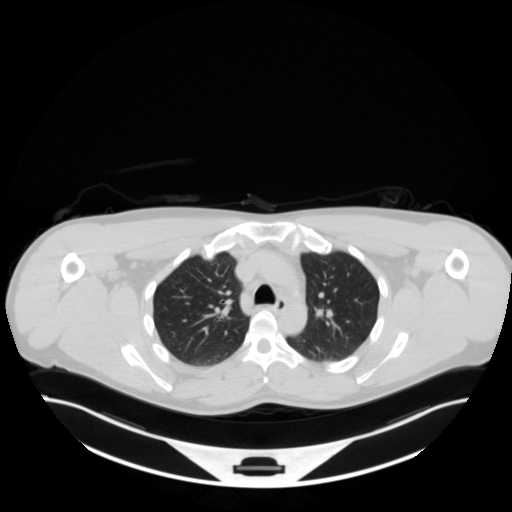
[im 241/241]
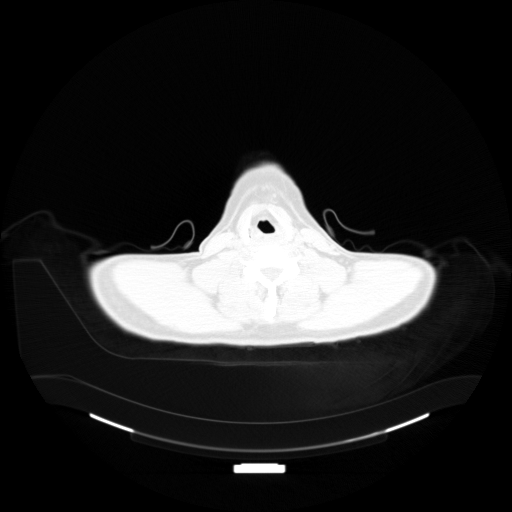

[Series 605: fused tra · 7 of 437 slices shown]
[im 1/437]
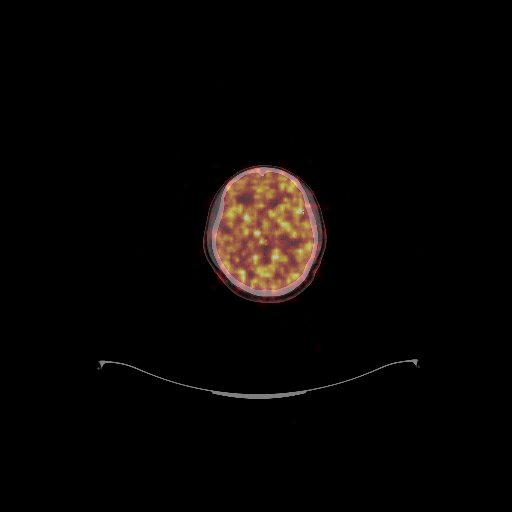
[im 73/437]
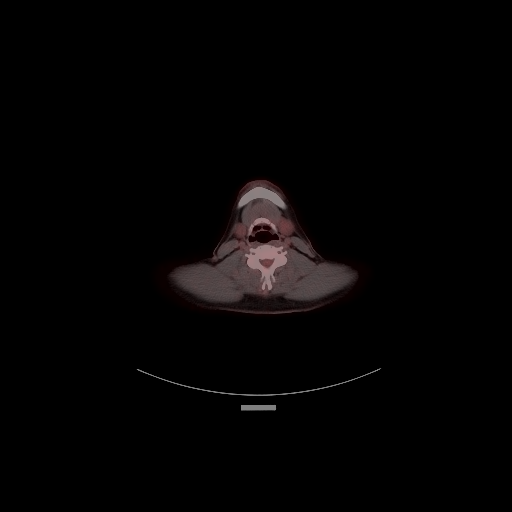
[im 146/437]
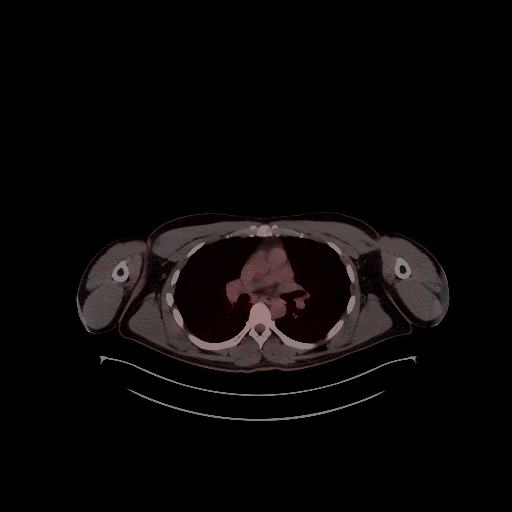
[im 219/437]
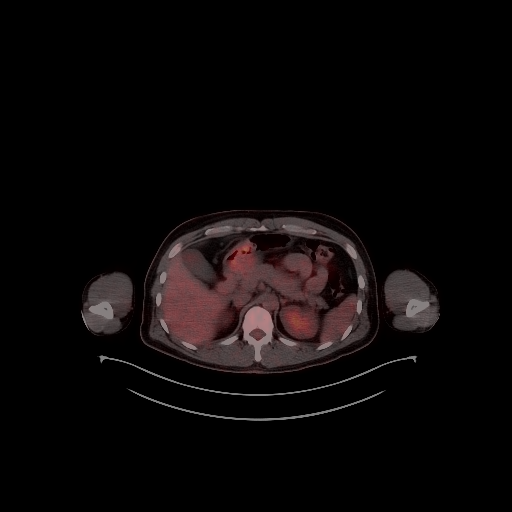
[im 291/437]
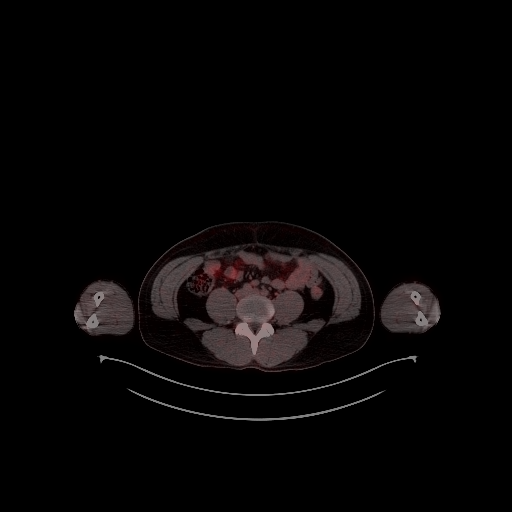
[im 364/437]
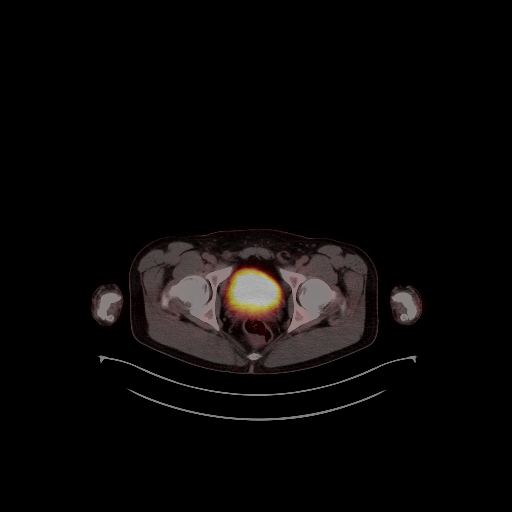
[im 437/437]
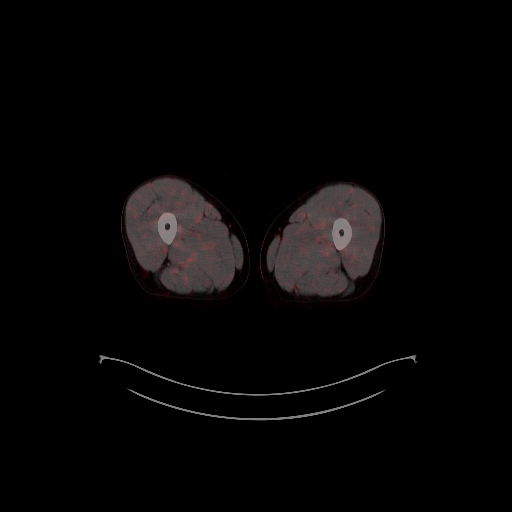

[Series 606: fused cor · 2 of 144 slices shown]
[im 1/144]
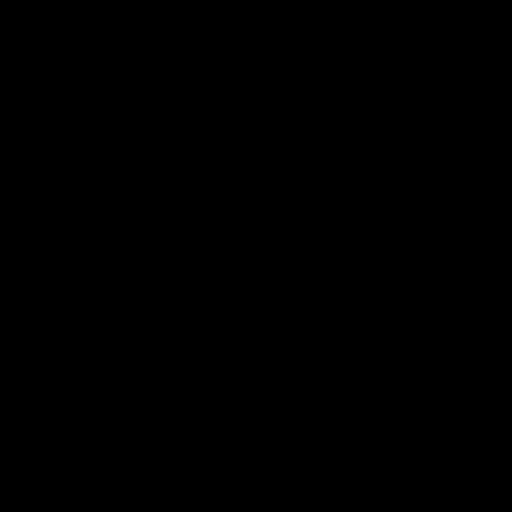
[im 144/144]
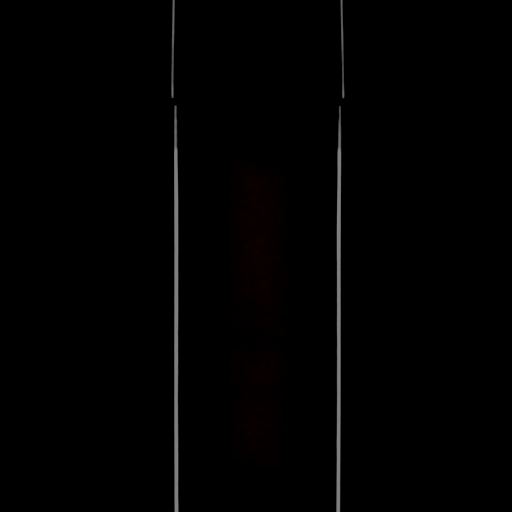

[Series 607: mip cine · coronal · 1.82mm/px · 1 of 48 slices shown]
[im 1/48]
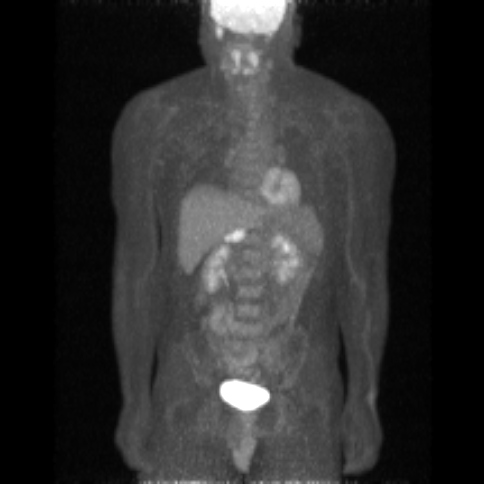

[24 of 25 positions shown; findings below may reference images not displayed]

FINDINGS: Mediastinal blood pool activity: SUV max

Liver activity: SUV max NA

NECK: No hypermetabolic lymph nodes in the neck.

Incidental CT findings: Asymmetric opacification of the left
maxillary sinus

CHEST: No hypermetabolic mediastinal or hilar nodes. No suspicious
pulmonary nodules on the CT scan.

Incidental CT findings: 3 mm nodule identified in the right middle
lobe, image 139/2. This is too small to characterize by PET-CT

ABDOMEN/PELVIS: No abnormal hypermetabolic activity within the
liver, pancreas, adrenal glands, or spleen.

Mass within the distal stomach measures approximately 4.9 x 2.8 cm
with SUV max of 5.32.

-Small gastrohepatic ligament lymph node (posterior to the antrum)
measures 0.7 cm with low-level FDG uptake, SUV max is 1.24, image
221/7.

-lymph node within the perigastric fat (anterior to the pylorus) is
stable measuring 5 mm and has an SUV max of 0.3, image 146/3.

-portacaval node measures 1.1 cm and has an SUV max of 1.0, image
153/3.

Incidental CT findings: none

SKELETON: No focal hypermetabolic activity to suggest skeletal
metastasis.

Incidental CT findings: none
IMPRESSION: 1. Mass within the gastric antrum is FDG avid within SUV max of
compatible with primary gastric adenocarcinoma.
2. No definite evidence for FDG avid nodal metastasis or distant
metastatic disease.
3. There is only low-level FDG uptake associated with the previously
described prominent upper abdominal lymph nodes, as described above.

## 2023-03-29 ENCOUNTER — Other Ambulatory Visit: Payer: Self-pay | Admitting: Hematology

## 2023-04-01 ENCOUNTER — Other Ambulatory Visit: Payer: Self-pay | Admitting: *Deleted

## 2023-04-01 MED ORDER — PANTOPRAZOLE SODIUM 40 MG PO TBEC: 40.0000 mg | DELAYED_RELEASE_TABLET | Freq: Two times a day (BID) | ORAL | 3 refills | Status: DC

## 2023-05-03 ENCOUNTER — Telehealth: Payer: Self-pay

## 2023-05-03 ENCOUNTER — Other Ambulatory Visit: Payer: Self-pay

## 2023-05-03 MED ORDER — PANTOPRAZOLE SODIUM 40 MG PO TBEC: 40.0000 mg | DELAYED_RELEASE_TABLET | Freq: Two times a day (BID) | ORAL | 3 refills | Status: DC

## 2023-05-03 NOTE — Telephone Encounter (Signed)
In patients chart refilling protonix as patient requested.

## 2023-06-23 ENCOUNTER — Inpatient Hospital Stay: Payer: 59 | Attending: Hematology

## 2023-06-23 ENCOUNTER — Encounter (HOSPITAL_COMMUNITY): Payer: Self-pay | Admitting: Hematology

## 2023-06-23 ENCOUNTER — Ambulatory Visit (HOSPITAL_COMMUNITY)
Admission: RE | Admit: 2023-06-23 | Discharge: 2023-06-23 | Disposition: A | Discharge status: Home / Self Care | Payer: 59 | Source: Ambulatory Visit | Attending: Hematology | Admitting: Hematology

## 2023-06-23 DIAGNOSIS — K5730 Diverticulosis of large intestine without perforation or abscess without bleeding: Secondary | ICD-10-CM | POA: Diagnosis not present

## 2023-06-23 DIAGNOSIS — C169 Malignant neoplasm of stomach, unspecified: Secondary | ICD-10-CM | POA: Insufficient documentation

## 2023-06-23 DIAGNOSIS — I700 Atherosclerosis of aorta: Secondary | ICD-10-CM | POA: Diagnosis not present

## 2023-06-23 DIAGNOSIS — R911 Solitary pulmonary nodule: Secondary | ICD-10-CM | POA: Diagnosis not present

## 2023-06-23 LAB — VITAMIN B12: Vitamin B-12: 1258 pg/mL — ABNORMAL HIGH (ref 180–914)

## 2023-06-23 LAB — CBC WITH DIFFERENTIAL/PLATELET
Abs Immature Granulocytes: 0.02 10*3/uL (ref 0.00–0.07)
Basophils Absolute: 0.1 10*3/uL (ref 0.0–0.1)
Basophils Relative: 1 %
Eosinophils Absolute: 0.1 10*3/uL (ref 0.0–0.5)
Eosinophils Relative: 2 %
HCT: 45.2 % (ref 39.0–52.0)
Hemoglobin: 15.9 g/dL (ref 13.0–17.0)
Immature Granulocytes: 0 %
Lymphocytes Relative: 34 %
Lymphs Abs: 1.9 10*3/uL (ref 0.7–4.0)
MCH: 29.2 pg (ref 26.0–34.0)
MCHC: 35.2 g/dL (ref 30.0–36.0)
MCV: 83.1 fL (ref 80.0–100.0)
Monocytes Absolute: 0.4 10*3/uL (ref 0.1–1.0)
Monocytes Relative: 6 %
Neutro Abs: 3.2 10*3/uL (ref 1.7–7.7)
Neutrophils Relative %: 57 %
Platelets: 185 10*3/uL (ref 150–400)
RBC: 5.44 MIL/uL (ref 4.22–5.81)
RDW: 12.8 % (ref 11.5–15.5)
WBC: 5.6 10*3/uL (ref 4.0–10.5)
nRBC: 0 % (ref 0.0–0.2)

## 2023-06-23 LAB — COMPREHENSIVE METABOLIC PANEL
ALT: 35 U/L (ref 0–44)
AST: 27 U/L (ref 15–41)
Albumin: 4.4 g/dL (ref 3.5–5.0)
Alkaline Phosphatase: 80 U/L (ref 38–126)
Anion gap: 11 (ref 5–15)
BUN: 12 mg/dL (ref 6–20)
CO2: 23 mmol/L (ref 22–32)
Calcium: 9.3 mg/dL (ref 8.9–10.3)
Chloride: 102 mmol/L (ref 98–111)
Creatinine, Ser: 0.85 mg/dL (ref 0.61–1.24)
GFR, Estimated: >60 mL/min (ref >60)
Glucose, Bld: 103 mg/dL — ABNORMAL HIGH (ref 70–99)
Potassium: 3.6 mmol/L (ref 3.5–5.1)
Sodium: 136 mmol/L (ref 135–145)
Total Bilirubin: 0.4 mg/dL (ref 0.3–1.2)
Total Protein: 7.7 g/dL (ref 6.5–8.1)

## 2023-06-23 LAB — VITAMIN D 25 HYDROXY (VIT D DEFICIENCY, FRACTURES): Vit D, 25-Hydroxy: 37 ng/mL (ref 30–100)

## 2023-06-23 MED ORDER — IOHEXOL 300 MG/ML  SOLN
100.0000 mL | Freq: Once | INTRAMUSCULAR | Status: AC | PRN
  Administered 2023-06-23: 100 mL via INTRAVENOUS

## 2023-06-24 LAB — CEA: CEA: 1.6 ng/mL (ref 0.0–4.7)

## 2023-06-29 ENCOUNTER — Inpatient Hospital Stay (HOSPITAL_BASED_OUTPATIENT_CLINIC_OR_DEPARTMENT_OTHER): Payer: 59 | Admitting: Hematology

## 2023-06-29 DIAGNOSIS — C169 Malignant neoplasm of stomach, unspecified: Secondary | ICD-10-CM | POA: Diagnosis not present

## 2023-06-29 DIAGNOSIS — D509 Iron deficiency anemia, unspecified: Secondary | ICD-10-CM

## 2023-06-29 DIAGNOSIS — E559 Vitamin D deficiency, unspecified: Secondary | ICD-10-CM | POA: Diagnosis not present

## 2023-06-29 DIAGNOSIS — C163 Malignant neoplasm of pyloric antrum: Secondary | ICD-10-CM

## 2023-06-29 DIAGNOSIS — K219 Gastro-esophageal reflux disease without esophagitis: Secondary | ICD-10-CM | POA: Insufficient documentation

## 2023-06-29 DIAGNOSIS — Z87891 Personal history of nicotine dependence: Secondary | ICD-10-CM | POA: Insufficient documentation

## 2023-06-29 DIAGNOSIS — R911 Solitary pulmonary nodule: Secondary | ICD-10-CM | POA: Diagnosis not present

## 2023-06-29 NOTE — Patient Instructions (Signed)
[facility]  Discharge Instructions  You were seen and examined today by Dr. Katragadda.  Dr. Katragadda discussed your most recent lab work and CT scan which revealed that everything looks good and stable.  Follow-up as scheduled in 6 months.    Thank you for choosing [facility] to provide your oncology and hematology care.   To afford each patient quality time with our provider, please arrive at least 15 minutes before your scheduled appointment time. You may need to reschedule your appointment if you arrive late (10 or more minutes). Arriving late affects you and other patients whose appointments are after yours.  Also, if you miss three or more appointments without notifying the office, you may be dismissed from the clinic at the provider's discretion.    Again, thank you for choosing [facility].  Our hope is that these requests will decrease the amount of time that you wait before being seen by our physicians.   If you have a lab appointment with the Cancer Center - please note that after April 8th, all labs will be drawn in the cancer center.  You do not have to check in or register with the main entrance as you have in the past but will complete your check-in at the cancer center.            _____________________________________________________________  Should you have questions after your visit to [facility], please contact our office at [phone] and follow the prompts.  Our office hours are 8:00 a.m. to 4:30 p.m. Monday - Thursday and 8:00 a.m. to 2:30 p.m. Friday.  Please note that voicemails left after 4:00 p.m. may not be returned until the following business day.  We are closed weekends and all major holidays.  You do have access to a nurse 24-7, just call the main number to the clinic [phone] and do not press any options, hold on the line and a nurse will answer the phone.    For  prescription refill requests, have your pharmacy contact our office and allow 72 hours.    Masks are no longer required in the cancer centers. If you would like for your care team to wear a mask while they are taking care of you, please let them know. You may have one support person who is at least 32 years old accompany you for your appointments.  

## 2023-06-29 NOTE — Progress Notes (Signed)
[facility] [address] [place] [address]    Clinic Day:  06/29/2023  Referring physician: Shelby Dubin, FNP  Patient Care Team: Shelby Dubin, FNP as PCP - General (Family Medicine) Lanelle Bal, DO as Consulting Physician (Gastroenterology) Therese Sarah, RN as Oncology Nurse Navigator (Oncology) Doreatha Massed, MD as Medical Oncologist (Oncology) Joeseph Amor, RN as Registered Nurse (Oncology)   ASSESSMENT & PLAN:   Assessment: 1.  Gastric antral adenocarcinoma: - EGD on 05/12/2021 gastric antral mass with large cratered ulcer.  Normal duodenal bulb and first part of the duodenum. - Biopsy of the antrum ulcer consistent with adenocarcinoma, at least intramucosal.  Depth of invasion cannot be judged as the biopsy was superficial. - CT CAP on 05/14/2021 with gastric antral soft tissue thickening/mass measuring 3.7 x 3 cm.  1.2 cm portacaval lymph node.  Node within the perigastric fat anterior to the pylorus measures 5 mm.  No evidence of distant metastatic disease in the abdomen or pelvis.  Isolated 4 mm right middle lobe lung nodule most likely incidental/benign. - 13 pound weight loss in the last 3 months. - PET scan on 05/29/2021 showed mass in the gastric antrum FDG avid with SUV 5.32.  No definite evidence of FDG avid nodal metastasis or distant metastatic disease.  There is only low-level FDG uptake associated with previously described prominent upper abdominal lymph nodes. - EGD/EUS on 06/19/2021-uT3N1 4 cm cratered noncircumferential distal gastric adenocarcinoma.  There is one suspicious 9 mm perigastric lymph node nearby the primary gastric mass. - Partial gastrectomy and D1 dissection on 11/03/2021 by Dr. Lovell Sheehan. - Pathology: 1.9 cm grade 2 adenocarcinoma, 0/19 lymph nodes involved, margins negative, ypT2 ypN0.  2.  Social/family history: - He is married and seen with his wife today. - He does Holiday representative work. - He quit smoking  cigarettes 13 years ago.  Quit drinking alcohol in March 2022. - No family history of malignancies.    Plan: 1.  Stage III (uT3 N1) gastric adenocarcinoma: - He has acid reflux when he eats greasy foods.  He is will continue Protonix twice daily and use Maalox/Mylanta as needed. - We reviewed labs from 06/23/2023: Normal LFTs and CBC.  CEA was 1.6. - CT CAP (06/23/2023): Prior gastrectomy without evidence of recurrence or metastatic disease.  Stable 3 mm right middle lobe lung nodule. - He continues to be in remission.  RTC 6 months with repeat scan and labs.  2.  Low vitamin D levels: - He reports taking vitamin D intermittently.  Vitamin D level is 37.  Orders Placed This Encounter  Procedures   CT CHEST ABDOMEN PELVIS W CONTRAST    Standing Status:   Future    Standing Expiration Date:   06/28/2024    Order Specific Question:   If indicated for the ordered procedure, I authorize the administration of contrast media per Radiology protocol    Answer:   Yes    Order Specific Question:   Does the patient have a contrast media/X-ray dye allergy?    Answer:   No    Order Specific Question:   Preferred imaging location?    Answer:   [facility]    Order Specific Question:   Release to patient    Answer:   Immediate    Order Specific Question:   If indicated for the ordered procedure, I authorize the administration of oral contrast media per Radiology protocol    Answer:  Yes   CBC with Differential/Platelet    Standing Status:   Future    Standing Expiration Date:   06/28/2024    Order Specific Question:   Release to patient    Answer:   Immediate   Comprehensive metabolic panel    Standing Status:   Future    Standing Expiration Date:   06/28/2024    Order Specific Question:   Release to patient    Answer:   Immediate   VITAMIN D 25 Hydroxy (Vit-D Deficiency, Fractures)    Standing Status:   Future    Standing Expiration Date:   06/28/2024    Order Specific Question:    Release to patient    Answer:   Immediate   Vitamin B12    Standing Status:   Future    Standing Expiration Date:   06/28/2024    Order Specific Question:   Release to patient    Answer:   Immediate   CEA    Standing Status:   Future    Standing Expiration Date:   06/28/2024      Mikeal Hawthorne R Teague,acting as a scribe for Doreatha Massed, MD.,have documented all relevant documentation on the behalf of Doreatha Massed, MD,as directed by  Doreatha Massed, MD while in the presence of Doreatha Massed, MD.  I, Doreatha Massed MD, have reviewed the above documentation for accuracy and completeness, and I agree with the above.    Doreatha Massed, MD   10/8/20243:24 PM  CHIEF COMPLAINT:   Diagnosis: adenocarcinoma of the stomach    Cancer Staging  Gastric cancer [facility]) Staging form: Stomach, AJCC 8th Edition - Clinical stage from 06/19/2021: Stage III (cT3, cN1, cM0) - Unsigned - Pathologic stage from 12/02/2021: Stage I (ypT2, pN0, cM0) - Unsigned    Prior Therapy: Gastroesophageal FLOT every 2 weeks for 4 cycles   Current Therapy:  surveillance    HISTORY OF PRESENT ILLNESS:   Oncology History  Gastric cancer (HCC)  06/19/2021 Initial Diagnosis   Gastric cancer (HCC)   06/25/2021 - 10/03/2021 Chemotherapy   Patient is on Treatment Plan : GASTROESOPHAGEAL FLOT q14d X 4 cycles        INTERVAL HISTORY:   Steven Martinez is a 32 y.o. male presenting to clinic today for follow up of adenocarcinoma of the stomach. He was last seen by me on 12/23/22.  Since his last visit, he underwent surveillance CT C/A/P on 06/23/23 that found: prior partial gastrectomy without evidence of local recurrence or metastatic disease in the chest, abdomen or pelvis; stable 3 mm right middle lobe pulmonary nodule; gas fluid levels in a patulous esophagus with retained versus refluxed contrast material; and colonic diverticulosis without findings of acute diverticulitis.  Today, he states that  he is doing well overall. His appetite level is at 100%. His energy level is at 90%.  He reports a normal appetite. He denies any pain, nausea, or difficulty swallowing. He notes he has acid reflux and is taking protonix. Acid reflux is worsened when eating greasy foods. He intermittently takes vitamin D supplements.   PAST MEDICAL HISTORY:   Past Medical History: Past Medical History:  Diagnosis Date   Acute upper GI bleed 10/13/2020   gastric ulcer with visible vessel   Alcohol abuse 10/13/2020   Quit Jan 2022   Cancer [facility])    GERD (gastroesophageal reflux disease)    Port-A-Cath in place 06/23/2021   Positive H. pylori test 09/2020   H pylori IgG + Jan 2022 s/p treatment  with Prevpac. EGD March 2022 with gastric biopsy negative for H. pylori.     Surgical History: Past Surgical History:  Procedure Laterality Date   BIOPSY  12/03/2020   Procedure: BIOPSY;  Surgeon: Lanelle Bal, DO;  [facility]: [facility];  Service: Endoscopy;;   BIOPSY  05/12/2021   Procedure: BIOPSY;  Surgeon: Lanelle Bal, DO;  [facility]: [facility];  Service: Endoscopy;;   ESOPHAGOGASTRODUODENOSCOPY (EGD) WITH PROPOFOL N/A 10/14/2020    Surgeon: Lanelle Bal, DO;  one non-bleeding, non-obstructing gastric ulcer with visible vessel s/p bipolar cautery, normal examined esophagus and duodenum.    ESOPHAGOGASTRODUODENOSCOPY (EGD) WITH PROPOFOL N/A 12/03/2020    Surgeon: Lanelle Bal, DO;   nonobstructing, nonbleeding gastric ulcer with clean base s/p biopsied, gastritis biopsied, normal examined duodenum.  Pathology with granulation tissue consistent with ulcer, mild chronic gastritis with focal intestinal metaplasia, H. pylori negative.  Repeat in 3 months.   ESOPHAGOGASTRODUODENOSCOPY (EGD) WITH PROPOFOL N/A 05/12/2021   Procedure: ESOPHAGOGASTRODUODENOSCOPY (EGD) WITH PROPOFOL;  Surgeon: Lanelle Bal, DO;  [facility]: [facility];  Service: Endoscopy;  Laterality: N/A;  10:30am    ESOPHAGOGASTRODUODENOSCOPY (EGD) WITH PROPOFOL N/A 06/19/2021   Procedure: ESOPHAGOGASTRODUODENOSCOPY (EGD) WITH PROPOFOL;  Surgeon: Rachael Fee, MD;  Location: [facility];  Service: Endoscopy;  Laterality: N/A;   EUS N/A 06/19/2021   Procedure: UPPER ENDOSCOPIC ULTRASOUND (EUS) RADIAL;  Surgeon: Rachael Fee, MD;  Location: [facility];  Service: Endoscopy;  Laterality: N/A;   GASTRECTOMY N/A 11/03/2021   Procedure: PARTIAL GASTRECTOMY;  Surgeon: Franky Macho, MD;  Location: [facility];  Service: General;  Laterality: N/A;   PORT-A-CATH REMOVAL Left 03/02/2022   Procedure: MINOR REMOVAL PORT-A-CATH;  Surgeon: Franky Macho, MD;  Location: [facility];  Service: General;  Laterality: Left;  pt to arrive at 6:30am   PORTACATH PLACEMENT Left 06/23/2021   Procedure: INSERTION PORT-A-CATH;  Surgeon: Franky Macho, MD;  Location: [facility];  Service: General;  Laterality: Left;    Social History: Social History   Socioeconomic History   Marital status: Married    Spouse name: Not on file   Number of children: Not on file   Years of education: Not on file   Highest education level: Not on file  Occupational History   Not on file  Tobacco Use   Smoking status: Never   Smokeless tobacco: Never  Vaping Use   Vaping status: Never Used  Substance and Sexual Activity   Alcohol use: Not Currently    Alcohol/week: 57.0 - 58.0 standard drinks of alcohol    Types: 54 Cans of beer, 3 - 4 Shots of liquor per week    Comment: Quit January 2022. Used to drink sixpack daily and 12-15 beers on Saturdays and Sundays.    Drug use: Never   Sexual activity: Yes  Other Topics Concern   Not on file  Social History Narrative   Not on file   Social Determinants of Health   Financial Resource Strain: High Risk (07/08/2021)   Overall Financial Resource Strain (CARDIA)    Difficulty of Paying Living Expenses: Very hard  Food Insecurity: No Food Insecurity (05/20/2021)   Hunger Vital Sign    Worried About  Running Out of Food in the Last Year: Never true    Ran Out of Food in the Last Year: Never true  Transportation Needs: No Transportation Needs (05/20/2021)   PRAPARE - Transportation    Lack of Transportation (Medical): No    Lack of  Transportation (Non-Medical): No  Physical Activity: Sufficiently Active (05/20/2021)   Exercise Vital Sign    Days of Exercise per Week: 5 days    Minutes of Exercise per Session: 60 min  Stress: No Stress Concern Present (05/20/2021)   [employer] of Occupational Health - Occupational Stress Questionnaire    Feeling of Stress : Not at all  Social Connections: Moderately Isolated (05/20/2021)   Social Connection and Isolation Panel [NHANES]    Frequency of Communication with Friends and Family: More than three times a week    Frequency of Social Gatherings with Friends and Family: More than three times a week    Attends Religious Services: Never    Database administrator or Organizations: No    Attends Banker Meetings: Never    Marital Status: Married  Catering manager Violence: Not At Risk (05/20/2021)   Humiliation, Afraid, Rape, and Kick questionnaire    Fear of Current or Ex-Partner: No    Emotionally Abused: No    Physically Abused: No    Sexually Abused: No    Family History: Family History  Problem Relation Age of Onset   Diabetes Mellitus II Mother    Hypertension Mother    Colon cancer Neg Hx    Colon polyps Neg Hx     Current Medications:  Current Outpatient Medications:    bariatric multiple vitamin (ADVANCED MULTI EA) CHEW chewable tablet, Chew 2 tablets by mouth daily., Disp: , Rfl:    Multiple Vitamin (MULTIVITAMIN WITH MINERALS) TABS tablet, Take 1 tablet by mouth in the morning., Disp: , Rfl:    oxyCODONE (ROXICODONE) 5 MG immediate release tablet, Take 1 tablet (5 mg total) by mouth every 8 (eight) hours as needed for severe pain., Disp: 60 tablet, Rfl: 0   pantoprazole (PROTONIX) 40 MG tablet, Take 1 tablet  (40 mg total) by mouth 2 (two) times daily., Disp: 60 tablet, Rfl: 3   Allergies: Allergies  Allergen Reactions   Oxaliplatin Itching    REVIEW OF SYSTEMS:   Review of Systems  Constitutional:  Negative for chills, fatigue and fever.  HENT:   Negative for lump/mass, mouth sores, nosebleeds, sore throat and trouble swallowing.   Eyes:  Negative for eye problems.  Respiratory:  Negative for cough and shortness of breath.   Cardiovascular:  Negative for chest pain, leg swelling and palpitations.  Gastrointestinal:  Negative for abdominal pain, constipation, diarrhea, nausea and vomiting.  Genitourinary:  Negative for bladder incontinence, difficulty urinating, dysuria, frequency, hematuria and nocturia.   Musculoskeletal:  Negative for arthralgias, back pain, flank pain, myalgias and neck pain.  Skin:  Negative for itching and rash.  Neurological:  Negative for dizziness, headaches and numbness.  Hematological:  Does not bruise/bleed easily.  Psychiatric/Behavioral:  Negative for depression, sleep disturbance and suicidal ideas. The patient is not nervous/anxious.   All other systems reviewed and are negative.    VITALS:   There were no vitals taken for this visit.  Wt Readings from Last 3 Encounters:  12/23/22 171 lb 6.4 oz (77.7 kg)  06/18/22 155 lb 13.8 oz (70.7 kg)  02/10/22 143 lb 4.8 oz (65 kg)    There is no height or weight on file to calculate BMI.  Performance status (ECOG): 0 - Asymptomatic  PHYSICAL EXAM:   Physical Exam Vitals and nursing note reviewed. Exam conducted with a chaperone present.  Constitutional:      Appearance: Normal appearance.  Cardiovascular:     Rate and Rhythm:  Normal rate and regular rhythm.     Pulses: Normal pulses.     Heart sounds: Normal heart sounds.  Pulmonary:     Effort: Pulmonary effort is normal.     Breath sounds: Normal breath sounds.  Abdominal:     Palpations: Abdomen is soft. There is no hepatomegaly, splenomegaly or  mass.     Tenderness: There is no abdominal tenderness.  Musculoskeletal:     Right lower leg: No edema.     Left lower leg: No edema.  Lymphadenopathy:     Cervical: No cervical adenopathy.     Right cervical: No superficial, deep or posterior cervical adenopathy.    Left cervical: No superficial, deep or posterior cervical adenopathy.     Upper Body:     Right upper body: No supraclavicular or axillary adenopathy.     Left upper body: No supraclavicular or axillary adenopathy.  Neurological:     General: No focal deficit present.     Mental Status: He is alert and oriented to person, place, and time.  Psychiatric:        Mood and Affect: Mood normal.        Behavior: Behavior normal.     LABS:      Latest Ref Rng & Units 06/23/2023    2:53 PM 12/16/2022    3:12 PM 06/11/2022    2:29 PM  CBC  WBC 4.0 - 10.5 K/uL 5.6  4.8  6.5   Hemoglobin 13.0 - 17.0 g/dL 16.1  09.6  04.5   Hematocrit 39.0 - 52.0 % 45.2  44.8  47.4   Platelets 150 - 400 K/uL 185  190  226       Latest Ref Rng & Units 06/23/2023    2:53 PM 12/16/2022    3:12 PM 06/11/2022    2:29 PM  CMP  Glucose 70 - 99 mg/dL 409  811  92   BUN 6 - 20 mg/dL 12  18  14    Creatinine 0.61 - 1.24 mg/dL 9.14  7.82  9.56   Sodium 135 - 145 mmol/L 136  137  137   Potassium 3.5 - 5.1 mmol/L 3.6  3.7  3.9   Chloride 98 - 111 mmol/L 102  107  102   CO2 22 - 32 mmol/L 23  24  26    Calcium 8.9 - 10.3 mg/dL 9.3  8.7  9.2   Total Protein 6.5 - 8.1 g/dL 7.7  7.5  8.4   Total Bilirubin 0.3 - 1.2 mg/dL 0.4  0.3  0.7   Alkaline Phos 38 - 126 U/L 80  83  105   AST 15 - 41 U/L 27  27  31    ALT 0 - 44 U/L 35  26  44      Lab Results  Component Value Date   CEA1 1.6 06/23/2023   /  CEA  Date Value Ref Range Status  06/23/2023 1.6 0.0 - 4.7 ng/mL Final    Comment:    (NOTE)                             Nonsmokers          <3.9                             Smokers             <5.6  [employer] Electrochemiluminescence  Immunoassay (ECLIA) Values obtained with different assay methods or kits cannot be used interchangeably.  Results cannot be interpreted as absolute evidence of the presence or absence of malignant disease. Performed At: [facility] [address] [place], [place] [id number] Jolene Schimke MD UY:[id number]    No results found for: "PSA1" No results found for: "CAN199" No results found for: "CAN125"  No results found for: "TOTALPROTELP", "ALBUMINELP", "A1GS", "A2GS", "BETS", "BETA2SER", "GAMS", "MSPIKE", "SPEI" Lab Results  Component Value Date   TIBC 329 12/16/2022   TIBC 357 06/11/2022   TIBC 319 02/05/2022   FERRITIN 46 12/16/2022   FERRITIN 116 06/11/2022   FERRITIN 86 02/05/2022   IRONPCTSAT 21 12/16/2022   IRONPCTSAT 20 06/11/2022   IRONPCTSAT 19 02/05/2022   No results found for: "LDH"   STUDIES:   CT CHEST ABDOMEN PELVIS W CONTRAST  Result Date: 06/24/2023 CLINICAL DATA:  History of gastric cancer, monitor. * Tracking Code: BO * EXAM: CT CHEST, ABDOMEN, AND PELVIS WITH CONTRAST TECHNIQUE: Multidetector CT imaging of the chest, abdomen and pelvis was performed following the standard protocol during bolus administration of intravenous contrast. RADIATION DOSE REDUCTION: This exam was performed according to the departmental dose-optimization program which includes automated exposure control, adjustment of the mA and/or kV according to patient size and/or use of iterative reconstruction technique. CONTRAST:  OMNIPAQUE IOHEXOL 300 MG/ML  SOLN COMPARISON:  Multiple priors including most recent CT December 17, 2022 FINDINGS: CT CHEST FINDINGS Cardiovascular: Aortic atherosclerosis. No central pulmonary embolus on this nondedicated study. Normal size heart. No significant pericardial effusion/thickening. Mediastinum/Nodes: No suspicious thyroid nodule. Prominent mediastinal lymph nodes are stable from prior examination. Gas fluid levels in a patulous esophagus with  retained versus refluxed contrast material. Lungs/Pleura: Stable 3 mm right middle lobe pulmonary nodule on image 89/3. No new suspicious pulmonary nodules or masses. Hypoventilatory change in the dependent lungs. No pleural effusion. No pneumothorax. Musculoskeletal: No aggressive lytic or blastic lesion of bone. CT ABDOMEN PELVIS FINDINGS Hepatobiliary: No suspicious hepatic lesion. Gallbladder is unremarkable. No biliary ductal dilation. Pancreas: No pancreatic ductal dilation or evidence of acute inflammation. Spleen: No splenomegaly. Adrenals/Urinary Tract: Bilateral adrenal glands appear normal. No hydronephrosis. Kidneys demonstrate symmetric enhancement. Urinary bladder is unremarkable for degree of distension. Stomach/Bowel: Radiopaque enteric contrast material traverses distal loops of small bowel. Prior partial gastrectomy without suspicious soft tissue nodularity along the anastomotic suture line. Small bowel small bowel anastomosis in the left hemiabdomen. No pathologic dilation of small or large bowel. No evidence of acute bowel inflammation. Normal appendix. Colonic diverticulosis without findings of acute diverticulitis. Vascular/Lymphatic: Normal caliber abdominal aorta. Smooth IVC contours. The portal, splenic and superior mesenteric veins are patent. No pathologically enlarged abdominal or pelvic lymph nodes. Reproductive: Prostate is unremarkable. Other: No significant abdominopelvic free fluid. Musculoskeletal: No aggressive lytic or blastic lesion of bone. IMPRESSION: 1. Prior partial gastrectomy without evidence of local recurrence or metastatic disease in the chest, abdomen or pelvis. 2. Stable 3 mm right middle lobe pulmonary nodule. 3. Gas fluid levels in a patulous esophagus with retained versus refluxed contrast material. 4. Colonic diverticulosis without findings of acute diverticulitis. 5. Aortic atherosclerosis. Aortic Atherosclerosis (ICD10-I70.0). Electronically Signed   By: Maudry Mayhew M.D.   On: 06/24/2023 16:53

## 2023-10-19 ENCOUNTER — Telehealth: Payer: Self-pay | Admitting: *Deleted

## 2023-10-19 NOTE — Telephone Encounter (Signed)
Pt's wife (DPR) called and states pt is having heart burn and needs some medication. Pt has not been seen in office in 2 years. Please make OV.

## 2023-11-02 NOTE — Telephone Encounter (Signed)
Noted. Pt has OV Next Wednesday

## 2023-11-03 ENCOUNTER — Encounter (HOSPITAL_COMMUNITY): Payer: Self-pay | Admitting: Hematology

## 2023-11-08 NOTE — H&P (View-Only) (Signed)
 Referring Provider: Shelby Dubin, FNP Primary Care Physician:  Shelby Dubin, FNP Primary GI Physician: Dr. Marletta Lor  Chief Complaint  Patient presents with   Gastroesophageal Reflux    States protonix bid is no longer working, has issues with acid coming back up in his throat.    HPI:   Steven Martinez is a 33 y.o. male presenting today with a history of H pylori, PUD with upper GI bleed in 2022,  IDA, GERD, gastric adenocarcinoma s/p neoadjuvant chemo followed by partial gastrectomy with Roux-en-Y reconstruction Feb. 2023, following with oncology for routine surveillance, presenting today with chief complaint of GERD and upper abdominal pain.    Today:  Has been taking pantoprazole twice a day for a while, over a year. Having issues with reflux daily for the last 2 months. Feels like something is just sitting in his chest and has to bring it back up. Worse with greasy foods. Choking on reflux at night. Liquids go down fine. Foods are going down, but comes back up. Symptoms also seem to be worse with the cold. Cold water or cold weather. Having some upper abdominal pain, left or right sided, but more left. Feels like his food isn't digesting.  Feels sharp. Last couple hours then resolves on its own. No nausea or vomiting. No hematemesis, brbpr, or melena. Pain has been present for several months.   No NSAIDs.   Stopped pantoprazole a couple weeks ago because it wasn't helping.   Had labs with PCP yesterday.    Prior GI Evaluation: EGD 12/03/2020: Nonobstructing, nonbleeding gastric ulcer with clean base s/p biopsy, gastritis biopsied, normal examined duodenum. Pathology with granulation tissue consistent with ulcer, mild chronic gastritis and focal intestinal metaplasia, H. pylori negative. Recommend repeat EGD in 3 months or so.   EGD 05/12/21: Gastritis, previously noted large cratered ulcer noted again, but appeared large and more masslike s/p biopsied, otherwise normal exam.  Pathology with adenocarcinoma, at least intramucosal.    CT C/A/P with contrast 05/14/21: gastric antral soft tissue thickening/mass measuring 3.7 x 3 cm.  1.2 cm portacaval lymph node.  Node within the perigastric fat anterior to the pylorus measures 5 mm.  No evidence of distant metastatic disease in the abdomen or pelvis.  Isolated 4 mm right middle lobe lung nodule most likely incidental/benign.   - PET scan on 05/29/2021 showed mass in the gastric antrum FDG avid with SUV 5.32.  No definite evidence of FDG avid nodal metastasis or distant metastatic disease.  There is only low-level FDG uptake associated with previously described prominent upper abdominal lymph nodes.  - EGD/EUS on 06/19/2021-uT3N1 4 cm cratered noncircumferential distal gastric adenocarcinoma.  There is one suspicious 9 mm perigastric lymph node nearby the primary gastric mass.  - Partial gastrectomy and D1 dissection on 11/03/2021 by Dr. Lovell Sheehan. Pathology: 1.9 cm grade 2 adenocarcinoma, 0/19 lymph nodes involved, margins negative, ypT2 ypN0.   - Most recent CT CAP (06/23/2023): Prior gastrectomy without evidence of recurrence or metastatic disease. Stable 3 mm right middle lobe lung nodule.   Past Medical History:  Diagnosis Date   Acute upper GI bleed 10/13/2020   gastric ulcer with visible vessel   Alcohol abuse 10/13/2020   Quit Jan 2022   Cancer [facility])    GERD (gastroesophageal reflux disease)    Port-A-Cath in place 06/23/2021   Positive H. pylori test 09/2020   H pylori IgG + Jan 2022 s/p treatment with Prevpac. EGD March 2022 with gastric biopsy  negative for H. pylori.     Past Surgical History:  Procedure Laterality Date   BIOPSY  12/03/2020   Procedure: BIOPSY;  Surgeon: Lanelle Bal, DO;  [facility];  Service: Endoscopy;;   BIOPSY  05/12/2021   Procedure: BIOPSY;  Surgeon: Lanelle Bal, DO;  [facility];  Service: Endoscopy;;   ESOPHAGOGASTRODUODENOSCOPY (EGD) WITH PROPOFOL  N/A 10/14/2020    Surgeon: Lanelle Bal, DO;  one non-bleeding, non-obstructing gastric ulcer with visible vessel s/p bipolar cautery, normal examined esophagus and duodenum.    ESOPHAGOGASTRODUODENOSCOPY (EGD) WITH PROPOFOL N/A 12/03/2020    Surgeon: Lanelle Bal, DO;   nonobstructing, nonbleeding gastric ulcer with clean base s/p biopsied, gastritis biopsied, normal examined duodenum.  Pathology with granulation tissue consistent with ulcer, mild chronic gastritis with focal intestinal metaplasia, H. pylori negative.  Repeat in 3 months.   ESOPHAGOGASTRODUODENOSCOPY (EGD) WITH PROPOFOL N/A 05/12/2021   Procedure: ESOPHAGOGASTRODUODENOSCOPY (EGD) WITH PROPOFOL;  Surgeon: Lanelle Bal, DO;  [facility];  Service: Endoscopy;  Laterality: N/A;  10:30am   ESOPHAGOGASTRODUODENOSCOPY (EGD) WITH PROPOFOL N/A 06/19/2021   Procedure: ESOPHAGOGASTRODUODENOSCOPY (EGD) WITH PROPOFOL;  Surgeon: Rachael Fee, MD;  [facility];  Service: Endoscopy;  Laterality: N/A;   EUS N/A 06/19/2021   Procedure: UPPER ENDOSCOPIC ULTRASOUND (EUS) RADIAL;  Surgeon: Rachael Fee, MD;  [facility];  Service: Endoscopy;  Laterality: N/A;   GASTRECTOMY N/A 11/03/2021   Procedure: PARTIAL GASTRECTOMY;  Surgeon: Franky Macho, MD;  [facility]: [facility];  Service: General;  Laterality: N/A;   PORT-A-CATH REMOVAL Left 03/02/2022   Procedure: MINOR REMOVAL PORT-A-CATH;  Surgeon: Franky Macho, MD;  Location: [facility];  Service: General;  Laterality: Left;  pt to arrive at 6:30am   PORTACATH PLACEMENT Left 06/23/2021   Procedure: INSERTION PORT-A-CATH;  Surgeon: Franky Macho, MD;  Location: [facility];  Service: General;  Laterality: Left;    Current Outpatient Medications  Medication Sig Dispense Refill   esomeprazole (NEXIUM) 40 MG capsule Take 1 capsule (40 mg total) by mouth 2 (two) times daily before a meal. 60 capsule 3   No current facility-administered medications for this visit.     Allergies as of 11/10/2023 - Review Complete 11/10/2023  Allergen Reaction Noted   Oxaliplatin Itching 09/04/2021    Family History  Problem Relation Age of Onset   Diabetes Mellitus II Mother    Hypertension Mother    Colon cancer Neg Hx    Colon polyps Neg Hx     Social History   Socioeconomic History   Marital status: Married    Spouse name: Not on file   Number of children: Not on file   Years of education: Not on file   Highest education level: Not on file  Occupational History   Not on file  Tobacco Use   Smoking status: Never   Smokeless tobacco: Never  Vaping Use   Vaping status: Never Used  Substance and Sexual Activity   Alcohol use: Not Currently    Alcohol/week: 57.0 - 58.0 standard drinks of alcohol    Types: 54 Cans of beer, 3 - 4 Shots of liquor per week    Comment: Quit January 2022. Used to drink sixpack daily and 12-15 beers on Saturdays and Sundays.    Drug use: Never   Sexual activity: Yes  Other Topics Concern   Not on file  Social History Narrative   Not on file   Social Drivers of Health  Financial Resource Strain: High Risk (07/08/2021)   Overall Financial Resource Strain (CARDIA)    Difficulty of Paying Living Expenses: Very hard  Food Insecurity: No Food Insecurity (05/20/2021)   Hunger Vital Sign    Worried About Running Out of Food in the Last Year: Never true    Ran Out of Food in the Last Year: Never true  Transportation Needs: No Transportation Needs (05/20/2021)   PRAPARE - Administrator, Civil Service (Medical): No    Lack of Transportation (Non-Medical): No  Physical Activity: Sufficiently Active (05/20/2021)   Exercise Vital Sign    Days of Exercise per Week: 5 days    Minutes of Exercise per Session: 60 min  Stress: No Stress Concern Present (05/20/2021)   [employer] of Occupational Health - Occupational Stress Questionnaire    Feeling of Stress : Not at all  Social Connections: Moderately Isolated  (05/20/2021)   Social Connection and Isolation Panel [NHANES]    Frequency of Communication with Friends and Family: More than three times a week    Frequency of Social Gatherings with Friends and Family: More than three times a week    Attends Religious Services: Never    Database administrator or Organizations: No    Attends Engineer, structural: Never    Marital Status: Married    Review of Systems: Gen: Denies fever, chills, cold or flu like symptoms, pre-syncope, syncope.  CV: Denies chest pain, palpitations. Resp: Denies dyspnea, cough.  GI: See HPI Heme: See HPI  Physical Exam: BP 120/77 (BP Location: Right Arm, Patient Position: Sitting, Cuff Size: Normal)   Pulse 63   Temp 98 F (36.7 C) (Oral)   Ht 5\' 7"  (1.702 m)   Wt 169 lb 6.4 oz (76.8 kg)   SpO2 98%   BMI 26.53 kg/m  General:  Alert and oriented. No distress noted. Pleasant and cooperative.  Head:  Normocephalic and atraumatic. Eyes:  Conjuctiva clear without scleral icterus. Heart:  S1, S2 present without murmurs appreciated. Lungs:  Clear to auscultation bilaterally. No wheezes, rales, or rhonchi. No distress.  Abdomen:  +BS, soft, and non-distended. Mild TTP in RUQ and epigastric area. No rebound or guarding. No HSM or masses noted. Msk:  Symmetrical without gross deformities. Normal posture. Extremities:  Without edema. Neurologic:  Alert and  oriented x4 Psych:  Normal mood and affect.    Assessment:  33 y.o. male presenting today with a history of H pylori, PUD with upper GI bleed in 2022,  IDA, GERD, gastric adenocarcinoma s/p neoadjuvant chemo followed by partial gastrectomy with Roux-en-Y reconstruction Feb. 2023, following with oncology for routine surveillance, presenting today with chief complaint of uncontrolled GERD, vague dysphagia/sensation of something sitting in his chest, and postprandial upper abdominal pain for the last few months. Previously on pantoprazole BID, but stopped as he  felt it wasn't helping. Denies NSAIDs. On exam, he has mild TTP in RUQ and epigastric region. Last labs on file from October 2024 with normal CBC, CMP, and CEA. CT CAP 06/23/23 with no evidence of recurrent of metastatic disease, gas fluid levels in patulous esophagus with retained vs refluxed contrast material.   Differential includes PUD, gastritis, loss of response to pantoprazole, recurrent malignancy, biliary etiology, or pancreatic etiology. Could also have esophageal web, ring, or stricture contributing to vague dysphagia symptoms.   I have recommended EGD, [place], and updating labs. Patient reported having labs completed yesterday with PCP, so we will request these for review  prior to ordering additional labs.  Will also start Nexium 40 mg BID.    Plan:  Proceed with upper endoscopy +/- dilation with propofol by Dr. Marletta Lor in near future. The risks, benefits, and alternatives have been discussed with the patient in detail. The patient states understanding and desires to proceed.  ASA 2 RUQ [place] Request labs from PCP.  Start Nexium 40 mg twice daily.   Ermalinda Memos, PA-C [facility] 11/10/2023

## 2023-11-08 NOTE — Progress Notes (Unsigned)
Referring Provider: Shelby Dubin, FNP Primary Care Physician:  Shelby Dubin, FNP Primary GI Physician: Dr. Marletta Lor  No chief complaint on file.   HPI:   Steven Martinez is a 33 y.o. male presenting today with a history of H pylori, PUD with upper GI bleed in 2022,  IDA, GERD, gastric adenocarcinoma s/p neoadjuvant chemo followed by partial gastrectomy with Roux-en-Y reconstruction Feb. 2023, following with oncology for routine surveillance, presenting today with chief complaint of GERD***   Today:     EGD 12/03/2020: Nonobstructing, nonbleeding gastric ulcer with clean base s/p biopsy, gastritis biopsied, normal examined duodenum. Pathology with granulation tissue consistent with ulcer, mild chronic gastritis and focal intestinal metaplasia, H. pylori negative. Recommend repeat EGD in 3 months or so.   EGD 05/12/21: Gastritis, previously noted large cratered ulcer noted again, but appeared large and more masslike s/p biopsied, otherwise normal exam. Pathology with adenocarcinoma, at least intramucosal.    CT C/A/P with contrast 05/14/21: gastric antral soft tissue thickening/mass measuring 3.7 x 3 cm.  1.2 cm portacaval lymph node.  Node within the perigastric fat anterior to the pylorus measures 5 mm.  No evidence of distant metastatic disease in the abdomen or pelvis.  Isolated 4 mm right middle lobe lung nodule most likely incidental/benign.   - PET scan on 05/29/2021 showed mass in the gastric antrum FDG avid with SUV 5.32.  No definite evidence of FDG avid nodal metastasis or distant metastatic disease.  There is only low-level FDG uptake associated with previously described prominent upper abdominal lymph nodes.  - EGD/EUS on 06/19/2021-uT3N1 4 cm cratered noncircumferential distal gastric adenocarcinoma.  There is one suspicious 9 mm perigastric lymph node nearby the primary gastric mass.  - Partial gastrectomy and D1 dissection on 11/03/2021 by Dr. Lovell Sheehan. Pathology: 1.9 cm grade  2 adenocarcinoma, 0/19 lymph nodes involved, margins negative, ypT2 ypN0.   - Most recent CT CAP (06/23/2023): Prior gastrectomy without evidence of recurrence or metastatic disease. Stable 3 mm right middle lobe lung nodule.   Past Medical History:  Diagnosis Date   Acute upper GI bleed 10/13/2020   gastric ulcer with visible vessel   Alcohol abuse 10/13/2020   Quit Jan 2022   Cancer [facility])    GERD (gastroesophageal reflux disease)    Port-A-Cath in place 06/23/2021   Positive H. pylori test 09/2020   H pylori IgG + Jan 2022 s/p treatment with Prevpac. EGD March 2022 with gastric biopsy negative for H. pylori.     Past Surgical History:  Procedure Laterality Date   BIOPSY  12/03/2020   Procedure: BIOPSY;  Surgeon: Lanelle Bal, DO;  [facility];  Service: Endoscopy;;   BIOPSY  05/12/2021   Procedure: BIOPSY;  Surgeon: Lanelle Bal, DO;  [facility];  Service: Endoscopy;;   ESOPHAGOGASTRODUODENOSCOPY (EGD) WITH PROPOFOL N/A 10/14/2020    Surgeon: Lanelle Bal, DO;  one non-bleeding, non-obstructing gastric ulcer with visible vessel s/p bipolar cautery, normal examined esophagus and duodenum.    ESOPHAGOGASTRODUODENOSCOPY (EGD) WITH PROPOFOL N/A 12/03/2020    Surgeon: Lanelle Bal, DO;   nonobstructing, nonbleeding gastric ulcer with clean base s/p biopsied, gastritis biopsied, normal examined duodenum.  Pathology with granulation tissue consistent with ulcer, mild chronic gastritis with focal intestinal metaplasia, H. pylori negative.  Repeat in 3 months.   ESOPHAGOGASTRODUODENOSCOPY (EGD) WITH PROPOFOL N/A 05/12/2021   Procedure: ESOPHAGOGASTRODUODENOSCOPY (EGD) WITH PROPOFOL;  Surgeon: Lanelle Bal, DO;  [facility];  Service: Endoscopy;  Laterality: N/A;  10:30am   ESOPHAGOGASTRODUODENOSCOPY (EGD) WITH PROPOFOL N/A 06/19/2021   Procedure: ESOPHAGOGASTRODUODENOSCOPY (EGD) WITH PROPOFOL;  Surgeon: Rachael Fee, MD;  [facility];  Service: Endoscopy;  Laterality: N/A;   EUS N/A 06/19/2021   Procedure: UPPER ENDOSCOPIC ULTRASOUND (EUS) RADIAL;  Surgeon: Rachael Fee, MD;  [facility];  Service: Endoscopy;  Laterality: N/A;   GASTRECTOMY N/A 11/03/2021   Procedure: PARTIAL GASTRECTOMY;  Surgeon: Franky Macho, MD;  [facility];  Service: General;  Laterality: N/A;   PORT-A-CATH REMOVAL Left 03/02/2022   Procedure: MINOR REMOVAL PORT-A-CATH;  Surgeon: Franky Macho, MD;  Location: [facility];  Service: General;  Laterality: Left;  pt to arrive at 6:30am   PORTACATH PLACEMENT Left 06/23/2021   Procedure: INSERTION PORT-A-CATH;  Surgeon: Franky Macho, MD;  Location: [facility];  Service: General;  Laterality: Left;    Current Outpatient Medications  Medication Sig Dispense Refill   bariatric multiple vitamin (ADVANCED MULTI EA) CHEW chewable tablet Chew 2 tablets by mouth daily.     Multiple Vitamin (MULTIVITAMIN WITH MINERALS) TABS tablet Take 1 tablet by mouth in the morning.     oxyCODONE (ROXICODONE) 5 MG immediate release tablet Take 1 tablet (5 mg total) by mouth every 8 (eight) hours as needed for severe pain. 60 tablet 0   pantoprazole (PROTONIX) 40 MG tablet Take 1 tablet (40 mg total) by mouth 2 (two) times daily. 60 tablet 3   No current facility-administered medications for this visit.    Allergies as of 11/10/2023 - Review Complete 06/29/2023  Allergen Reaction Noted   Oxaliplatin Itching 09/04/2021    Family History  Problem Relation Age of Onset   Diabetes Mellitus II Mother    Hypertension Mother    Colon cancer Neg Hx    Colon polyps Neg Hx     Social History   Socioeconomic History   Marital status: Married    Spouse name: Not on file   Number of children: Not on file   Years of education: Not on file   Highest education level: Not on file  Occupational History   Not on file  Tobacco Use   Smoking status: Never   Smokeless tobacco: Never  Vaping Use   Vaping  status: Never Used  Substance and Sexual Activity   Alcohol use: Not Currently    Alcohol/week: 57.0 - 58.0 standard drinks of alcohol    Types: 54 Cans of beer, 3 - 4 Shots of liquor per week    Comment: Quit January 2022. Used to drink sixpack daily and 12-15 beers on Saturdays and Sundays.    Drug use: Never   Sexual activity: Yes  Other Topics Concern   Not on file  Social History Narrative   Not on file   Social Drivers of Health   Financial Resource Strain: High Risk (07/08/2021)   Overall Financial Resource Strain (CARDIA)    Difficulty of Paying Living Expenses: Very hard  Food Insecurity: No Food Insecurity (05/20/2021)   Hunger Vital Sign    Worried About Running Out of Food in the Last Year: Never true    Ran Out of Food in the Last Year: Never true  Transportation Needs: No Transportation Needs (05/20/2021)   PRAPARE - Administrator, Civil Service (Medical): No    Lack of Transportation (Non-Medical): No  Physical Activity: Sufficiently Active (05/20/2021)   Exercise Vital Sign    Days of Exercise per Week: 5  days    Minutes of Exercise per Session: 60 min  Stress: No Stress Concern Present (05/20/2021)   [employer] of Occupational Health - Occupational Stress Questionnaire    Feeling of Stress : Not at all  Social Connections: Moderately Isolated (05/20/2021)   Social Connection and Isolation Panel [NHANES]    Frequency of Communication with Friends and Family: More than three times a week    Frequency of Social Gatherings with Friends and Family: More than three times a week    Attends Religious Services: Never    Database administrator or Organizations: No    Attends Banker Meetings: Never    Marital Status: Married    Review of Systems: Gen: Denies fever, chills, anorexia. Denies fatigue, weakness, weight loss.  CV: Denies chest pain, palpitations, syncope, peripheral edema, and claudication. Resp: Denies dyspnea at rest,  cough, wheezing, coughing up blood, and pleurisy. GI: Denies vomiting blood, jaundice, and fecal incontinence.   Denies dysphagia or odynophagia. Derm: Denies rash, itching, dry skin Psych: Denies depression, anxiety, memory loss, confusion. No homicidal or suicidal ideation.  Heme: Denies bruising, bleeding, and enlarged lymph nodes.  Physical Exam: There were no vitals taken for this visit. General:   Alert and oriented. No distress noted. Pleasant and cooperative.  Head:  Normocephalic and atraumatic. Eyes:  Conjuctiva clear without scleral icterus. Heart:  S1, S2 present without murmurs appreciated. Lungs:  Clear to auscultation bilaterally. No wheezes, rales, or rhonchi. No distress.  Abdomen:  +BS, soft, non-tender and non-distended. No rebound or guarding. No HSM or masses noted. Msk:  Symmetrical without gross deformities. Normal posture. Extremities:  Without edema. Neurologic:  Alert and  oriented x4 Psych:  Normal mood and affect.    Assessment:     Plan:  ***   Ermalinda Memos, PA-C [facility] 11/10/2023

## 2023-11-10 ENCOUNTER — Encounter (HOSPITAL_COMMUNITY): Payer: Self-pay | Admitting: Hematology

## 2023-11-10 ENCOUNTER — Encounter: Payer: Self-pay | Admitting: *Deleted

## 2023-11-10 ENCOUNTER — Ambulatory Visit (INDEPENDENT_AMBULATORY_CARE_PROVIDER_SITE_OTHER): Payer: Self-pay | Admitting: Gastroenterology

## 2023-11-10 ENCOUNTER — Encounter: Payer: Self-pay | Admitting: Gastroenterology

## 2023-11-10 VITALS — BP 120/77 | HR 63 | Temp 98.0°F | Ht 67.0 in | Wt 169.4 lb

## 2023-11-10 DIAGNOSIS — K219 Gastro-esophageal reflux disease without esophagitis: Secondary | ICD-10-CM

## 2023-11-10 DIAGNOSIS — R10811 Right upper quadrant abdominal tenderness: Secondary | ICD-10-CM

## 2023-11-10 DIAGNOSIS — R1310 Dysphagia, unspecified: Secondary | ICD-10-CM

## 2023-11-10 DIAGNOSIS — R10816 Epigastric abdominal tenderness: Secondary | ICD-10-CM

## 2023-11-10 DIAGNOSIS — R1010 Upper abdominal pain, unspecified: Secondary | ICD-10-CM

## 2023-11-10 MED ORDER — ESOMEPRAZOLE MAGNESIUM 40 MG PO CPDR
40.0000 mg | DELAYED_RELEASE_CAPSULE | Freq: Two times a day (BID) | ORAL | 3 refills | Status: DC
Start: 2023-11-10 — End: 2023-11-15

## 2023-11-10 NOTE — Patient Instructions (Addendum)
English: I am requesting the blood work you completed yesterday with your primary doctor for review.   We will get you scheduled for an upper endoscopy with possible stretching of your esophagus with Dr. Marletta Lor.  We will also get you scheduled for an ultrasound of your gallbladder.  Start Nexium 40 mg twice daily 30 minutes before breakfast and dinner for your acid reflux.  I will plan to see back in the office after your procedure.   Ermalinda Memos, PA-C [facility]enter[facility]   Spanish:  Solicito que su mdico de cabecera revise el anlisis de sangre que realiz ayer.   Lo programaremos para una endoscopia superior con posible estiramiento de su esfago con el Dr. Marletta Lor.  Tambin lo programaremos para una ecografa de su vescula biliar.  Comience con Nexium 40 mg dos veces al da 30 minutos antes del desayuno y la cena para el reflujo cido.  Planear volver a verlo en la oficina despus de su procedimiento.   Ermalinda Memos, PA-C [facility]

## 2023-11-12 ENCOUNTER — Other Ambulatory Visit: Payer: Self-pay

## 2023-11-12 ENCOUNTER — Encounter (HOSPITAL_COMMUNITY)
Admission: RE | Admit: 2023-11-12 | Discharge: 2023-11-12 | Disposition: A | Discharge status: Home / Self Care | Payer: Self-pay | Source: Ambulatory Visit | Attending: Internal Medicine | Admitting: Internal Medicine

## 2023-11-12 ENCOUNTER — Encounter (HOSPITAL_COMMUNITY): Payer: Self-pay

## 2023-11-15 ENCOUNTER — Ambulatory Visit (HOSPITAL_COMMUNITY)
Admission: RE | Admit: 2023-11-15 | Discharge: 2023-11-15 | Disposition: A | Discharge status: Home / Self Care | Payer: Self-pay | Attending: Internal Medicine | Admitting: Internal Medicine

## 2023-11-15 ENCOUNTER — Ambulatory Visit (HOSPITAL_COMMUNITY): Payer: Self-pay | Admitting: Anesthesiology

## 2023-11-15 ENCOUNTER — Encounter (HOSPITAL_COMMUNITY): Payer: Self-pay | Admitting: Internal Medicine

## 2023-11-15 ENCOUNTER — Encounter (HOSPITAL_COMMUNITY)
Admission: RE | Disposition: A | Discharge status: Home / Self Care | Payer: Self-pay | Source: Home / Self Care | Attending: Internal Medicine

## 2023-11-15 DIAGNOSIS — R1310 Dysphagia, unspecified: Secondary | ICD-10-CM | POA: Insufficient documentation

## 2023-11-15 DIAGNOSIS — K449 Diaphragmatic hernia without obstruction or gangrene: Secondary | ICD-10-CM

## 2023-11-15 DIAGNOSIS — K2970 Gastritis, unspecified, without bleeding: Secondary | ICD-10-CM

## 2023-11-15 DIAGNOSIS — Z85028 Personal history of other malignant neoplasm of stomach: Secondary | ICD-10-CM | POA: Insufficient documentation

## 2023-11-15 DIAGNOSIS — Z903 Acquired absence of stomach [part of]: Secondary | ICD-10-CM | POA: Insufficient documentation

## 2023-11-15 DIAGNOSIS — Z8711 Personal history of peptic ulcer disease: Secondary | ICD-10-CM | POA: Insufficient documentation

## 2023-11-15 DIAGNOSIS — Z9221 Personal history of antineoplastic chemotherapy: Secondary | ICD-10-CM | POA: Insufficient documentation

## 2023-11-15 DIAGNOSIS — Z980 Intestinal bypass and anastomosis status: Secondary | ICD-10-CM | POA: Insufficient documentation

## 2023-11-15 DIAGNOSIS — R1013 Epigastric pain: Secondary | ICD-10-CM | POA: Insufficient documentation

## 2023-11-15 DIAGNOSIS — Z5986 Financial insecurity: Secondary | ICD-10-CM | POA: Insufficient documentation

## 2023-11-15 DIAGNOSIS — K2100 Gastro-esophageal reflux disease with esophagitis, without bleeding: Secondary | ICD-10-CM | POA: Insufficient documentation

## 2023-11-15 DIAGNOSIS — Z8619 Personal history of other infectious and parasitic diseases: Secondary | ICD-10-CM | POA: Insufficient documentation

## 2023-11-15 HISTORY — PX: BIOPSY: SHX5522

## 2023-11-15 HISTORY — PX: ESOPHAGOGASTRODUODENOSCOPY (EGD) WITH PROPOFOL: SHX5813

## 2023-11-15 SURGERY — ESOPHAGOGASTRODUODENOSCOPY (EGD) WITH PROPOFOL
Anesthesia: General

## 2023-11-15 MED ORDER — LACTATED RINGERS IV SOLN: INTRAVENOUS | Status: DC | PRN

## 2023-11-15 MED ORDER — PROPOFOL 500 MG/50ML IV EMUL
INTRAVENOUS | Status: DC | PRN
  Administered 2023-11-15: 150 ug/kg/min via INTRAVENOUS

## 2023-11-15 MED ORDER — LIDOCAINE HCL (CARDIAC) PF 100 MG/5ML IV SOSY
PREFILLED_SYRINGE | INTRAVENOUS | Status: DC | PRN
  Administered 2023-11-15: 80 mg via INTRAVENOUS

## 2023-11-15 MED ORDER — DEXMEDETOMIDINE HCL IN NACL 80 MCG/20ML IV SOLN
INTRAVENOUS | Status: AC
  Filled 2023-11-15: qty 40

## 2023-11-15 MED ORDER — DEXLANSOPRAZOLE 60 MG PO CPDR: 60.0000 mg | DELAYED_RELEASE_CAPSULE | Freq: Every day | ORAL | 11 refills | Status: DC

## 2023-11-15 MED ORDER — DEXMEDETOMIDINE HCL IN NACL 80 MCG/20ML IV SOLN
INTRAVENOUS | Status: DC | PRN
  Administered 2023-11-15: 8 ug via INTRAVENOUS

## 2023-11-15 MED ORDER — GLYCOPYRROLATE PF 0.2 MG/ML IJ SOSY
PREFILLED_SYRINGE | INTRAMUSCULAR | Status: AC
  Filled 2023-11-15: qty 1

## 2023-11-15 MED ORDER — LIDOCAINE HCL (CARDIAC) PF 100 MG/5ML IV SOSY: PREFILLED_SYRINGE | INTRAVENOUS | Status: DC | PRN

## 2023-11-15 MED ORDER — LIDOCAINE HCL (PF) 2 % IJ SOLN
INTRAMUSCULAR | Status: AC
  Filled 2023-11-15: qty 50

## 2023-11-15 MED ORDER — PROPOFOL 10 MG/ML IV BOLUS
INTRAVENOUS | Status: DC | PRN
  Administered 2023-11-15: 50 mg via INTRAVENOUS
  Administered 2023-11-15: 30 mg via INTRAVENOUS

## 2023-11-15 NOTE — Interval H&P Note (Signed)
 History and Physical Interval Note:  11/15/2023 7:40 AM  Steven Martinez  has presented today for surgery, with the diagnosis of dysphagia, upper abd pain, gerd.  The various methods of treatment have been discussed with the patient and family. After consideration of risks, benefits and other options for treatment, the patient has consented to  Procedure(s) with comments: ESOPHAGOGASTRODUODENOSCOPY (EGD) WITH PROPOFOL (N/A) - 945am, asa 2 BALLOON DILATION (N/A) as a surgical intervention.  The patient's history has been reviewed, patient examined, no change in status, stable for surgery.  I have reviewed the patient's chart and labs.  Questions were answered to the patient's satisfaction.     Lanelle Bal

## 2023-11-15 NOTE — Anesthesia Preprocedure Evaluation (Addendum)
 Anesthesia Evaluation  Patient identified by MRN, date of birth, ID band Patient awake    Reviewed: Allergy & Precautions, H&P , NPO status , Patient's Chart, lab work & pertinent test results, reviewed documented beta blocker date and time   Airway Mallampati: II  TM Distance: >3 FB Neck ROM: full    Dental no notable dental hx. (+) Dental Advisory Given, Teeth Intact   Pulmonary neg pulmonary ROS   Pulmonary exam normal breath sounds clear to auscultation       Cardiovascular Exercise Tolerance: Good negative cardio ROS Normal cardiovascular exam Rhythm:regular Rate:Normal     Neuro/Psych negative neurological ROS  negative psych ROS   GI/Hepatic Neg liver ROS, PUD,GERD  Medicated,,History upper GI bleeding   Endo/Other  negative endocrine ROS    Renal/GU negative Renal ROS  negative genitourinary   Musculoskeletal   Abdominal   Peds  Hematology  (+) Blood dyscrasia, anemia   Anesthesia Other Findings Alcohol abuse.  Reproductive/Obstetrics negative OB ROS                             Anesthesia Physical Anesthesia Plan  ASA: 2  Anesthesia Plan: General   Post-op Pain Management: Minimal or no pain anticipated   Induction: Intravenous  PONV Risk Score and Plan: Propofol infusion  Airway Management Planned: Natural Airway and Nasal Cannula  Additional Equipment: None  Intra-op Plan:   Post-operative Plan:   Informed Consent: I have reviewed the patients History and Physical, chart, labs and discussed the procedure including the risks, benefits and alternatives for the proposed anesthesia with the patient or authorized representative who has indicated his/her understanding and acceptance.     Dental Advisory Given and Interpreter used for interview  Plan Discussed with: CRNA  Anesthesia Plan Comments:         Anesthesia Quick Evaluation

## 2023-11-15 NOTE — Op Note (Addendum)
 [facility] Patient Name: Steven Martinez Procedure Date: 11/15/2023 7:51 AM MRN: [id number] Date of Birth: 08-09-1991 Attending MD: Hennie Duos. Marletta Lor , [place], [id number] CSN: [id number] Age: 33 Admit Type: Outpatient Procedure:                Upper GI endoscopy Indications:              Epigastric abdominal pain, Dysphagia, Heartburn Providers:                Hennie Duos. Marletta Lor, DO, Sheran Fava, Lennice Sites Technician, Technician Referring MD:             Hennie Duos. Marletta Lor, DO Medicines:                See the Anesthesia note for documentation of the                            administered medications Complications:            No immediate complications. Estimated Blood Loss:     Estimated blood loss was minimal. Procedure:                Pre-Anesthesia Assessment:                           - The anesthesia plan was to use monitored                            anesthesia care (MAC).                           After obtaining informed consent, the endoscope was                            passed under direct vision. Throughout the                            procedure, the patient's blood pressure, pulse, and                            oxygen saturations were monitored continuously. The                            GIF-H190 ([id number]) scope was introduced through the                            mouth, and advanced to the second part of duodenum.                            The upper GI endoscopy was accomplished without                            difficulty. The patient tolerated the procedure  well. Scope In: 8:11:47 AM Scope Out: 8:16:58 AM Total Procedure Duration: 0 hours 5 minutes 11 seconds  Findings:      A 1 cm hiatal hernia was present.      LA Grade C (one or more mucosal breaks continuous between tops of 2 or       more mucosal folds, less than 75% circumference) esophagitis was found       in the distal esophagus.  Biopsies were taken with a cold forceps for       histology.      Evidence of a previous surgical anastomosis was found in the gastric       antrum. This was characterized by healthy appearing mucosa.      Patchy mild inflammation characterized by erythema was found in the       gastric body. Biopsies were taken with a cold forceps for Helicobacter       pylori testing.      The second portion of the duodenum was normal. Impression:               - 1 cm hiatal hernia.                           - LA Grade C reflux esophagitis. Biopsied.                           - A previous surgical anastomosis was found,                            characterized by healthy appearing mucosa.                           - Gastritis. Biopsied.                           - Normal second portion of the duodenum. Moderate Sedation:      Per Anesthesia Care Recommendation:           - Patient has a contact number available for                            emergencies. The signs and symptoms of potential                            delayed complications were discussed with the                            patient. Return to normal activities tomorrow.                            Written discharge instructions were provided to the                            patient.                           - Resume previous diet.                           -  Continue present medications.                           - Await pathology results.                           - Use a proton pump inhibitor PO BID.                           - No ibuprofen, naproxen, or other non-steroidal                            anti-inflammatory drugs.                           - Return to GI clinic in 6 weeks. Patient                            interested in TIF. Will schedule with Dr. Levon Hedger                            to discuss further. Procedure Code(s):        --- Professional ---                           [phone], Esophagogastroduodenoscopy, flexible,                             transoral; with biopsy, single or multiple Diagnosis Code(s):        --- Professional ---                           K44.9, Diaphragmatic hernia without obstruction or                            gangrene                           K21.00, Gastro-esophageal reflux disease with                            esophagitis, without bleeding                           Z98.0, Intestinal bypass and anastomosis status                           K29.70, Gastritis, unspecified, without bleeding                           R10.13, Epigastric pain                           R13.10, Dysphagia, unspecified                           R12, Heartburn CPT copyright 2022 American Medical Association. All rights reserved. The codes documented in this  report are preliminary and upon coder review may  be revised to meet current compliance requirements. Hennie Duos. Marletta Lor, DO Hennie Duos. Marletta Lor, DO 11/15/2023 8:25:05 AM This report has been signed electronically. Number of Addenda: 0

## 2023-11-15 NOTE — Transfer of Care (Signed)
 Immediate Anesthesia Transfer of Care Note  Patient: Steven Martinez  Procedure(s) Performed: ESOPHAGOGASTRODUODENOSCOPY (EGD) WITH PROPOFOL BIOPSY  Patient Location: PACU and Endoscopy Unit  Anesthesia Type:MAC  Level of Consciousness: awake and alert   Airway & Oxygen Therapy: Patient Spontanous Breathing and Patient connected to nasal cannula oxygen  Post-op Assessment: Report given to RN and Post -op Vital signs reviewed and stable  Post vital signs: Reviewed and stable  Last Vitals:  Vitals Value Taken Time  BP    Temp    Pulse    Resp    SpO2      Last Pain:  Vitals:   11/15/23 0807  TempSrc:   PainSc: 4          Complications: No notable events documented.

## 2023-11-15 NOTE — Discharge Instructions (Addendum)
 EGD Discharge instructions Please read the instructions outlined below and refer to this sheet in the next few weeks. These discharge instructions provide you with general information on caring for yourself after you leave the hospital. Your doctor may also give you specific instructions. While your treatment has been planned according to the most current medical practices available, unavoidable complications occasionally occur. If you have any problems or questions after discharge, please call your doctor. ACTIVITY You may resume your regular activity but move at a slower pace for the next 24 hours.  Take frequent rest periods for the next 24 hours.  Walking will help expel (get rid of) the air and reduce the bloated feeling in your abdomen.  No driving for 24 hours (because of the anesthesia (medicine) used during the test).  You may shower.  Do not sign any important legal documents or operate any machinery for 24 hours (because of the anesthesia used during the test).  NUTRITION Drink plenty of fluids.  You may resume your normal diet.  Begin with a light meal and progress to your normal diet.  Avoid alcoholic beverages for 24 hours or as instructed by your caregiver.  MEDICATIONS You may resume your normal medications unless your caregiver tells you otherwise.  WHAT YOU CAN EXPECT TODAY You may experience abdominal discomfort such as a feeling of fullness or "gas" pains.  FOLLOW-UP Your doctor will discuss the results of your test with you.  SEEK IMMEDIATE MEDICAL ATTENTION IF ANY OF THE FOLLOWING OCCUR: Excessive nausea (feeling sick to your stomach) and/or vomiting.  Severe abdominal pain and distention (swelling).  Trouble swallowing.  Temperature over 101 F (37.8 C).  Rectal bleeding or vomiting of blood.   Your upper endoscopy revealed evidence of reflux esophagitis as well as small hiatal hernia.  I took samples.  We will call with results.  Mild inflammation in your stomach  which also sampled.  Previous surgical site looks good.  I did not see any evidence of cancer recurrence.  Continue on Nexium twice daily.  Follow-up in GI office in 6 weeks.   I hope you have a great rest of your week!  Hennie Duos. Marletta Lor, D.O. Gastroenterology and Hepatology [facility]

## 2023-11-15 NOTE — Anesthesia Postprocedure Evaluation (Signed)
 Anesthesia Post Note  Patient: Steven Martinez  Procedure(s) Performed: ESOPHAGOGASTRODUODENOSCOPY (EGD) WITH PROPOFOL BIOPSY  Patient location during evaluation: PACU Anesthesia Type: General Level of consciousness: awake and alert Pain management: pain level controlled Vital Signs Assessment: post-procedure vital signs reviewed and stable Respiratory status: spontaneous breathing, nonlabored ventilation, respiratory function stable and patient connected to nasal cannula oxygen Cardiovascular status: blood pressure returned to baseline and stable Postop Assessment: no apparent nausea or vomiting Anesthetic complications: no   There were no known notable events for this encounter.   Last Vitals:  Vitals:   11/15/23 0718 11/15/23 0824  BP: 132/86 113/79  Pulse: 72 66  Resp: 12 12  Temp: 36.7 C 36.7 C  SpO2: 98% 99%    Last Pain:  Vitals:   11/15/23 0824  TempSrc: Axillary  PainSc: 0-No pain                 Demoni Parmar L Ellijah Leffel

## 2023-11-15 NOTE — Addendum Note (Signed)
 Addendum  created 11/15/23 1036 by Deri Fuelling, CRNA   Flowsheet accepted, Intraprocedure Meds edited

## 2023-11-16 ENCOUNTER — Encounter (HOSPITAL_COMMUNITY): Payer: Self-pay | Admitting: Internal Medicine

## 2023-11-16 LAB — SURGICAL PATHOLOGY

## 2023-11-18 ENCOUNTER — Telehealth: Payer: Self-pay | Admitting: Gastroenterology

## 2023-11-18 ENCOUNTER — Ambulatory Visit (HOSPITAL_COMMUNITY): Payer: Self-pay

## 2023-11-18 NOTE — Telephone Encounter (Signed)
 Received and reviewed labs from PCP dated 11/09/2023.  Patient had lipid panel, CMP, CBC, TSH, vitamin B12/folate, hemoglobin A1c.  Labs were remarkable for AST 61 (H), ALT 115 (H), hemoglobin A1c 5.9, vitamin B12 1157.  Otherwise, labs within normal range.   I am not sure why his liver enzymes are elevated.  He needs to completely right upper quadrant ultrasound that I ordered at his last office visit so that we can take a look at his liver.  Due to language barrier, it would be best if we can get this rescheduled for him and be sure to contact him with a Spanish interpreter.  Additionally, he would need to avoid over-the-counter supplements, herbal teas, alcohol, Tylenol if he is consuming any of these.   Tammy Clifton: Please relay the above information to the patient with the use of a Spanish interpreter.  Tammy Radford: Please reschedule RUQ ultrasound and contact patient with Spanish interpreter to relay date and time.

## 2023-11-22 ENCOUNTER — Encounter: Payer: Self-pay | Admitting: *Deleted

## 2023-11-22 NOTE — Telephone Encounter (Signed)
 Interpreter lmom for return call.

## 2023-11-24 NOTE — Telephone Encounter (Signed)
 Steven Martinez spoke with pt and informed him of recommendations as well as told him when to go for ultrasound. Pt verbalized understanding.

## 2023-11-24 NOTE — Telephone Encounter (Signed)
 Pt was informed of providers message and recommendations, also spoke with pt's wife [place] (on dpr) informed her of providers message and recommendations. She stated that she had cancelled the last [place] because she thought it was for something else. Informed her that [place] was rescheduled for different date and gave appt for [place] at [facility] on 12/01/23, arrive at 8:15 am, NPO after midnight.

## 2023-12-01 ENCOUNTER — Ambulatory Visit (HOSPITAL_COMMUNITY)
Admission: RE | Admit: 2023-12-01 | Discharge: 2023-12-01 | Disposition: A | Discharge status: Home / Self Care | Payer: Self-pay | Source: Ambulatory Visit | Attending: Gastroenterology | Admitting: Gastroenterology

## 2023-12-01 DIAGNOSIS — R1010 Upper abdominal pain, unspecified: Secondary | ICD-10-CM | POA: Insufficient documentation

## 2023-12-06 ENCOUNTER — Encounter (HOSPITAL_COMMUNITY): Payer: Self-pay | Admitting: Hematology

## 2023-12-06 ENCOUNTER — Encounter: Payer: Self-pay | Admitting: *Deleted

## 2023-12-16 ENCOUNTER — Telehealth (INDEPENDENT_AMBULATORY_CARE_PROVIDER_SITE_OTHER): Payer: Self-pay

## 2023-12-16 ENCOUNTER — Encounter: Payer: Self-pay | Admitting: Internal Medicine

## 2023-12-16 ENCOUNTER — Ambulatory Visit: Payer: Self-pay | Admitting: Internal Medicine

## 2023-12-16 ENCOUNTER — Telehealth: Payer: Self-pay

## 2023-12-16 VITALS — BP 134/86 | HR 69 | Temp 98.2°F | Ht 67.0 in | Wt 159.6 lb

## 2023-12-16 DIAGNOSIS — R7989 Other specified abnormal findings of blood chemistry: Secondary | ICD-10-CM

## 2023-12-16 DIAGNOSIS — K219 Gastro-esophageal reflux disease without esophagitis: Secondary | ICD-10-CM

## 2023-12-16 DIAGNOSIS — R1011 Right upper quadrant pain: Secondary | ICD-10-CM

## 2023-12-16 DIAGNOSIS — K8020 Calculus of gallbladder without cholecystitis without obstruction: Secondary | ICD-10-CM

## 2023-12-16 DIAGNOSIS — F1091 Alcohol use, unspecified, in remission: Secondary | ICD-10-CM

## 2023-12-16 DIAGNOSIS — Z85028 Personal history of other malignant neoplasm of stomach: Secondary | ICD-10-CM

## 2023-12-16 DIAGNOSIS — G8929 Other chronic pain: Secondary | ICD-10-CM

## 2023-12-16 NOTE — Progress Notes (Signed)
 Referring Provider: Shelby Dubin, FNP Primary Care Physician:  Shelby Dubin, FNP Primary GI:  Dr. Marletta Lor  Chief Complaint  Patient presents with   Follow-up    Discuss [place] and elevated liver enzymes    HPI:   Steven Martinez is a 33 y.o. male who presents to clinic today for follow-up visit.  He has a history of H pylori, PUD with upper GI bleed in 2022,  IDA, GERD, gastric adenocarcinoma s/p neoadjuvant chemo followed by partial gastrectomy with Roux-en-Y reconstruction Feb. 2023, following with oncology for routine surveillance   Abdominal pain: Patient notes both epigastric pain as well as right upper quadrant pain.  Right upper quadrant pain worse after meals, has severely limited his diet.  Occasional nausea.  Does note diarrhea as well at times.  Ultrasound 12/01/2023 showed evidence of cholelithiasis without cholecystitis.  Abnormal LFTs: Routine blood work by patient's PCP 11/09/2023 remarkable for AST 61 (H), ALT 115 (H), hemoglobin A1c 5.9, vitamin B12 1157. Otherwise, labs within normal range.   Ultrasound findings as above.  Historically normal LFTs in the past.  Does note he may have had a viral syndrome at the time.  Denies any chronic alcohol use.  No illicit drug use.  No exposure to viral hepatitis that he is aware of.  Does note drinking herbal teas but denies any green tea extract.  No other herbal supplements.  Chronic GERD: Not well-controlled.  Previously on pantoprazole twice daily.  EGD 11/15/2023 with 1 cm hiatal hernia, LA grade C esophagitis, evidence of previous surgical anastomosis in the gastric antrum, normal duodenum.  GE biopsies unremarkable, gastric biopsies negative for H. pylori.   Past Medical History:  Diagnosis Date   Acute upper GI bleed 10/13/2020   gastric ulcer with visible vessel   Alcohol abuse 10/13/2020   Quit Jan 2022   Cancer [facility])    GERD (gastroesophageal reflux disease)    Port-A-Cath in place 06/23/2021   Positive H.  pylori test 09/2020   H pylori IgG + Jan 2022 s/p treatment with Prevpac. EGD March 2022 with gastric biopsy negative for H. pylori.     Past Surgical History:  Procedure Laterality Date   BIOPSY  12/03/2020   Procedure: BIOPSY;  Surgeon: Lanelle Bal, DO;  [facility];  Service: Endoscopy;;   BIOPSY  05/12/2021   Procedure: BIOPSY;  Surgeon: Lanelle Bal, DO;  [facility];  Service: Endoscopy;;   BIOPSY  11/15/2023   Procedure: BIOPSY;  Surgeon: Lanelle Bal, DO;  [facility];  Service: Endoscopy;;   ESOPHAGOGASTRODUODENOSCOPY (EGD) WITH PROPOFOL N/A 10/14/2020    Surgeon: Lanelle Bal, DO;  one non-bleeding, non-obstructing gastric ulcer with visible vessel s/p bipolar cautery, normal examined esophagus and duodenum.    ESOPHAGOGASTRODUODENOSCOPY (EGD) WITH PROPOFOL N/A 12/03/2020    Surgeon: Lanelle Bal, DO;   nonobstructing, nonbleeding gastric ulcer with clean base s/p biopsied, gastritis biopsied, normal examined duodenum.  Pathology with granulation tissue consistent with ulcer, mild chronic gastritis with focal intestinal metaplasia, H. pylori negative.  Repeat in 3 months.   ESOPHAGOGASTRODUODENOSCOPY (EGD) WITH PROPOFOL N/A 05/12/2021   Procedure: ESOPHAGOGASTRODUODENOSCOPY (EGD) WITH PROPOFOL;  Surgeon: Lanelle Bal, DO;  [facility];  Service: Endoscopy;  Laterality: N/A;  10:30am   ESOPHAGOGASTRODUODENOSCOPY (EGD) WITH PROPOFOL N/A 06/19/2021   Procedure: ESOPHAGOGASTRODUODENOSCOPY (EGD) WITH PROPOFOL;  Surgeon: Rachael Fee, MD;  Location: [facility];  Service: Endoscopy;  Laterality:  N/A;   ESOPHAGOGASTRODUODENOSCOPY (EGD) WITH PROPOFOL N/A 11/15/2023   Procedure: ESOPHAGOGASTRODUODENOSCOPY (EGD) WITH PROPOFOL;  Surgeon: Lanelle Bal, DO;  [facility];  Service: Endoscopy;  Laterality: N/A;  945am, asa 2   EUS N/A 06/19/2021   Procedure: UPPER ENDOSCOPIC ULTRASOUND (EUS) RADIAL;   Surgeon: Rachael Fee, MD;  Location: [facility];  Service: Endoscopy;  Laterality: N/A;   GASTRECTOMY N/A 11/03/2021   Procedure: PARTIAL GASTRECTOMY;  Surgeon: Franky Macho, MD;  Location: [facility];  Service: General;  Laterality: N/A;   PORT-A-CATH REMOVAL Left 03/02/2022   Procedure: MINOR REMOVAL PORT-A-CATH;  Surgeon: Franky Macho, MD;  Location: [facility];  Service: General;  Laterality: Left;  pt to arrive at 6:30am   PORTACATH PLACEMENT Left 06/23/2021   Procedure: INSERTION PORT-A-CATH;  Surgeon: Franky Macho, MD;  Location: [facility];  Service: General;  Laterality: Left;    Current Outpatient Medications  Medication Sig Dispense Refill   dexlansoprazole (DEXILANT) 60 MG capsule Take 1 capsule (60 mg total) by mouth daily. (Patient not taking: Reported on 12/16/2023) 30 capsule 11   No current facility-administered medications for this visit.    Allergies as of 12/16/2023 - Review Complete 12/16/2023  Allergen Reaction Noted   Oxaliplatin Itching 09/04/2021    Family History  Problem Relation Age of Onset   Diabetes Mellitus II Mother    Hypertension Mother    Colon cancer Neg Hx    Colon polyps Neg Hx     Social History   Socioeconomic History   Marital status: Married    Spouse name: Not on file   Number of children: Not on file   Years of education: Not on file   Highest education level: Not on file  Occupational History   Not on file  Tobacco Use   Smoking status: Never   Smokeless tobacco: Never  Vaping Use   Vaping status: Never Used  Substance and Sexual Activity   Alcohol use: Not Currently    Alcohol/week: 57.0 - 58.0 standard drinks of alcohol    Types: 54 Cans of beer, 3 - 4 Shots of liquor per week    Comment: Quit January 2022. Used to drink sixpack daily and 12-15 beers on Saturdays and Sundays.    Drug use: Never   Sexual activity: Yes    Partners: Female  Other Topics Concern   Not on file  Social History Narrative   Not on file    Social Drivers of Health   Financial Resource Strain: High Risk (07/08/2021)   Overall Financial Resource Strain (CARDIA)    Difficulty of Paying Living Expenses: Very hard  Food Insecurity: No Food Insecurity (05/20/2021)   Hunger Vital Sign    Worried About Running Out of Food in the Last Year: Never true    Ran Out of Food in the Last Year: Never true  Transportation Needs: No Transportation Needs (05/20/2021)   PRAPARE - Administrator, Civil Service (Medical): No    Lack of Transportation (Non-Medical): No  Physical Activity: Sufficiently Active (05/20/2021)   Exercise Vital Sign    Days of Exercise per Week: 5 days    Minutes of Exercise per Session: 60 min  Stress: No Stress Concern Present (05/20/2021)   [employer] of Occupational Health - Occupational Stress Questionnaire    Feeling of Stress : Not at all  Social Connections: Moderately Isolated (05/20/2021)   Social Connection and Isolation Panel [NHANES]    Frequency of Communication with  Friends and Family: More than three times a week    Frequency of Social Gatherings with Friends and Family: More than three times a week    Attends Religious Services: Never    Database administrator or Organizations: No    Attends Banker Meetings: Never    Marital Status: Married    Subjective: Review of Systems  Constitutional:  Negative for chills and fever.  HENT:  Negative for congestion and hearing loss.   Eyes:  Negative for blurred vision and double vision.  Respiratory:  Negative for cough and shortness of breath.   Cardiovascular:  Negative for chest pain and palpitations.  Gastrointestinal:  Positive for abdominal pain, diarrhea, heartburn and nausea. Negative for blood in stool, constipation, melena and vomiting.  Genitourinary:  Negative for dysuria and urgency.  Musculoskeletal:  Negative for joint pain and myalgias.  Skin:  Negative for itching and rash.  Neurological:  Negative for  dizziness and headaches.  Psychiatric/Behavioral:  Negative for depression. The patient is not nervous/anxious.      Objective: BP 134/86 (BP Location: Right Arm, Patient Position: Sitting, Cuff Size: Normal)   Pulse 70   Temp 98.2 F (36.8 C) (Oral)   Ht 5\' 7"  (1.702 m)   Wt 159 lb 9.6 oz (72.4 kg)   SpO2 96%   BMI 25.00 kg/m  Physical Exam Constitutional:      Appearance: Normal appearance.  HENT:     Head: Normocephalic and atraumatic.  Eyes:     Extraocular Movements: Extraocular movements intact.     Conjunctiva/sclera: Conjunctivae normal.  Cardiovascular:     Rate and Rhythm: Normal rate and regular rhythm.  Pulmonary:     Effort: Pulmonary effort is normal.     Breath sounds: Normal breath sounds.  Abdominal:     General: Bowel sounds are normal.     Palpations: Abdomen is soft.  Musculoskeletal:        General: Normal range of motion.     Cervical back: Normal range of motion and neck supple.  Skin:    General: Skin is warm.  Neurological:     General: No focal deficit present.     Mental Status: He is alert and oriented to person, place, and time.  Psychiatric:        Mood and Affect: Mood normal.        Behavior: Behavior normal.      Assessment/Plan:  1.  Abdominal pain, nausea, diarrhea-evidence of cholelithiasis on ultrasound.  Will refer to Dr. Lovell Sheehan to discuss possible role of cholecystectomy.  2.  Abnormal LFTs-etiology unclear, possibly due to viral syndrome.  Will perform serologies today including autoimmune, iron disorders, viral hepatitis testing.  Recheck LFTs as well.  3.  Chronic GERD-difficult to control.  Discussed case with Dr. Levon Hedger role of TIF who felt given patient's decreased gastric cavity, would not be a good candidate. Will give samples of Voquezna.   4.  Gastric adenocarcinoma status postsurgical resection-continue follow-up with oncology  Follow-up in 8 weeks.   12/16/2023 9:54 AM   Disclaimer: This note was  dictated with voice recognition software. Similar sounding words can inadvertently be transcribed and may not be corrected upon review.

## 2023-12-16 NOTE — Telephone Encounter (Signed)
 Noted.

## 2023-12-16 NOTE — Telephone Encounter (Signed)
 Per Dr. Marletta Lor via secure chat: Can you please call this patient's wife and let her know that I talked to Dr. Levon Hedger about TIF who felt he would not be a good candidate for this given his altered stomach anatomy since his surgery. Would recommend he trial on Voquezna. Please provide samples if she is agreeable. Follow-up with me in 8 weeks. Thank you    Pt's wife [place] was contacted and will be going to pick samples up of the Voquezna from the main st office.

## 2023-12-16 NOTE — Patient Instructions (Signed)
 I am going to check blood work today at [facility] to check for potential causes of abnormal liver test.  We will call with results.  I am going to refer you to Dr. Lovell Sheehan to discuss gallbladder surgery.  I am also going to discuss your case further with Dr. Levon Hedger to see if you would be a good candidate for reflux procedure.  It was very nice seeing both of you again today.  Dr. Marletta Lor

## 2023-12-16 NOTE — Telephone Encounter (Signed)
 Zada Finders send a private message asking me to get the patient a three week supply of Voquezna 20 mg, as the patient wife will be by [facility] to pick up. The drug rep came by and brought some samples. I have placed # 25 tablet of Voquezna 20 mg at the front desk for the wife to pick up at the [facility] location.

## 2023-12-17 ENCOUNTER — Encounter: Payer: Self-pay | Admitting: *Deleted

## 2023-12-21 ENCOUNTER — Encounter (HOSPITAL_COMMUNITY): Payer: Self-pay | Admitting: Radiology

## 2023-12-21 ENCOUNTER — Other Ambulatory Visit: Payer: Self-pay | Admitting: *Deleted

## 2023-12-21 ENCOUNTER — Inpatient Hospital Stay: Payer: PRIVATE HEALTH INSURANCE | Attending: Hematology | Admitting: Hematology

## 2023-12-21 ENCOUNTER — Ambulatory Visit (HOSPITAL_COMMUNITY)
Admission: RE | Admit: 2023-12-21 | Discharge: 2023-12-21 | Disposition: A | Discharge status: Home / Self Care | Payer: Self-pay | Source: Ambulatory Visit | Attending: Hematology | Admitting: Hematology

## 2023-12-21 DIAGNOSIS — D509 Iron deficiency anemia, unspecified: Secondary | ICD-10-CM

## 2023-12-21 DIAGNOSIS — C163 Malignant neoplasm of pyloric antrum: Secondary | ICD-10-CM | POA: Insufficient documentation

## 2023-12-21 DIAGNOSIS — C169 Malignant neoplasm of stomach, unspecified: Secondary | ICD-10-CM

## 2023-12-21 DIAGNOSIS — R7989 Other specified abnormal findings of blood chemistry: Secondary | ICD-10-CM

## 2023-12-21 DIAGNOSIS — Z87891 Personal history of nicotine dependence: Secondary | ICD-10-CM | POA: Insufficient documentation

## 2023-12-21 DIAGNOSIS — G8929 Other chronic pain: Secondary | ICD-10-CM

## 2023-12-21 DIAGNOSIS — K6289 Other specified diseases of anus and rectum: Secondary | ICD-10-CM

## 2023-12-21 DIAGNOSIS — E559 Vitamin D deficiency, unspecified: Secondary | ICD-10-CM | POA: Insufficient documentation

## 2023-12-21 LAB — CBC WITH DIFFERENTIAL/PLATELET
Abs Immature Granulocytes: 0.01 10*3/uL (ref 0.00–0.07)
Basophils Absolute: 0 10*3/uL (ref 0.0–0.1)
Basophils Relative: 1 %
Eosinophils Absolute: 0.1 10*3/uL (ref 0.0–0.5)
Eosinophils Relative: 2 %
HCT: 48.5 % (ref 39.0–52.0)
Hemoglobin: 16.1 g/dL (ref 13.0–17.0)
Immature Granulocytes: 0 %
Lymphocytes Relative: 36 %
Lymphs Abs: 1.7 10*3/uL (ref 0.7–4.0)
MCH: 28.3 pg (ref 26.0–34.0)
MCHC: 33.2 g/dL (ref 30.0–36.0)
MCV: 85.4 fL (ref 80.0–100.0)
Monocytes Absolute: 0.2 10*3/uL (ref 0.1–1.0)
Monocytes Relative: 5 %
Neutro Abs: 2.7 10*3/uL (ref 1.7–7.7)
Neutrophils Relative %: 56 %
Platelets: 173 10*3/uL (ref 150–400)
RBC: 5.68 MIL/uL (ref 4.22–5.81)
RDW: 13.1 % (ref 11.5–15.5)
WBC: 4.8 10*3/uL (ref 4.0–10.5)
nRBC: 0 % (ref 0.0–0.2)

## 2023-12-21 LAB — HEPATITIS C ANTIBODY: HCV Ab: NONREACTIVE

## 2023-12-21 LAB — COMPREHENSIVE METABOLIC PANEL WITH GFR
ALT: 31 U/L (ref 0–44)
AST: 26 U/L (ref 15–41)
Albumin: 4.6 g/dL (ref 3.5–5.0)
Alkaline Phosphatase: 95 U/L (ref 38–126)
Anion gap: 11 (ref 5–15)
BUN: 11 mg/dL (ref 6–20)
CO2: 26 mmol/L (ref 22–32)
Calcium: 9.1 mg/dL (ref 8.9–10.3)
Chloride: 100 mmol/L (ref 98–111)
Creatinine, Ser: 0.85 mg/dL (ref 0.61–1.24)
GFR, Estimated: >60 mL/min (ref >60)
Glucose, Bld: 105 mg/dL — ABNORMAL HIGH (ref 70–99)
Potassium: 3.6 mmol/L (ref 3.5–5.1)
Sodium: 137 mmol/L (ref 135–145)
Total Bilirubin: 0.6 mg/dL (ref 0.0–1.2)
Total Protein: 8.3 g/dL — ABNORMAL HIGH (ref 6.5–8.1)

## 2023-12-21 LAB — HEPATITIS B SURFACE ANTIGEN: Hepatitis B Surface Ag: NONREACTIVE

## 2023-12-21 LAB — HEPATIC FUNCTION PANEL
ALT: 31 U/L (ref 0–44)
AST: 27 U/L (ref 15–41)
Albumin: 4.5 g/dL (ref 3.5–5.0)
Alkaline Phosphatase: 94 U/L (ref 38–126)
Bilirubin, Direct: <0.1 mg/dL (ref 0.0–0.2)
Total Bilirubin: 0.5 mg/dL (ref 0.0–1.2)
Total Protein: 8.3 g/dL — ABNORMAL HIGH (ref 6.5–8.1)

## 2023-12-21 LAB — VITAMIN B12: Vitamin B-12: 822 pg/mL (ref 180–914)

## 2023-12-21 LAB — HEPATITIS B SURFACE ANTIBODY,QUALITATIVE: Hep B S Ab: REACTIVE — AB

## 2023-12-21 LAB — HEPATITIS A ANTIBODY, TOTAL: hep A Total Ab: REACTIVE — AB

## 2023-12-21 LAB — VITAMIN D 25 HYDROXY (VIT D DEFICIENCY, FRACTURES): Vit D, 25-Hydroxy: 36.89 ng/mL (ref 30–100)

## 2023-12-21 MED ORDER — IOHEXOL 300 MG/ML  SOLN
100.0000 mL | Freq: Once | INTRAMUSCULAR | Status: AC | PRN
  Administered 2023-12-21: 100 mL via INTRAVENOUS

## 2023-12-21 MED ORDER — IOHEXOL 9 MG/ML PO SOLN
500.0000 mL | ORAL | Status: AC
  Administered 2023-12-21: 500 mL via ORAL

## 2023-12-22 ENCOUNTER — Other Ambulatory Visit: Payer: Self-pay | Admitting: *Deleted

## 2023-12-22 DIAGNOSIS — C169 Malignant neoplasm of stomach, unspecified: Secondary | ICD-10-CM

## 2023-12-22 LAB — CEA: CEA: 1.5 ng/mL (ref 0.0–4.7)

## 2023-12-22 LAB — ANTI-SMOOTH MUSCLE ANTIBODY, IGG: F-Actin IgG: 10 U (ref 0–19)

## 2023-12-22 LAB — IRON AND TIBC
Iron: 78 ug/dL (ref 45–182)
Saturation Ratios: 22 % (ref 17.9–39.5)
TIBC: 354 ug/dL (ref 250–450)
UIBC: 276 ug/dL

## 2023-12-22 LAB — FERRITIN: Ferritin: 51 ng/mL (ref 24–336)

## 2023-12-22 LAB — HEPATITIS B CORE ANTIBODY, TOTAL: HEP B CORE AB: NEGATIVE

## 2023-12-22 NOTE — Progress Notes (Signed)
 Lab orders for [facility]

## 2023-12-25 LAB — MISC LABCORP TEST (SEND OUT): Labcorp test code: 165126

## 2023-12-28 ENCOUNTER — Ambulatory Visit (INDEPENDENT_AMBULATORY_CARE_PROVIDER_SITE_OTHER): Payer: Self-pay | Admitting: General Surgery

## 2023-12-28 ENCOUNTER — Inpatient Hospital Stay: Payer: PRIVATE HEALTH INSURANCE | Admitting: Hematology

## 2023-12-28 ENCOUNTER — Encounter (HOSPITAL_COMMUNITY): Payer: Self-pay | Admitting: Hematology

## 2023-12-28 ENCOUNTER — Encounter: Payer: Self-pay | Admitting: General Surgery

## 2023-12-28 VITALS — BP 131/85 | HR 82 | Temp 97.6°F | Resp 14 | Ht 67.0 in | Wt 156.0 lb

## 2023-12-28 VITALS — BP 140/98 | HR 72 | Temp 96.5°F | Resp 18 | Wt 159.0 lb

## 2023-12-28 DIAGNOSIS — C169 Malignant neoplasm of stomach, unspecified: Secondary | ICD-10-CM

## 2023-12-28 DIAGNOSIS — D509 Iron deficiency anemia, unspecified: Secondary | ICD-10-CM

## 2023-12-28 DIAGNOSIS — K8020 Calculus of gallbladder without cholecystitis without obstruction: Secondary | ICD-10-CM

## 2023-12-28 LAB — ANTINUCLEAR ANTIBODIES, IFA: ANA Ab, IFA: NEGATIVE

## 2023-12-28 NOTE — Progress Notes (Signed)
 [facility] [address] [place] [address]    Clinic Day:  12/28/2023  Referring physician: Shelby Dubin, FNP  Patient Care Team: Shelby Dubin, FNP as PCP - General (Family Medicine) Lanelle Bal, DO as Consulting Physician (Gastroenterology) Therese Sarah, RN as Oncology Nurse Navigator (Oncology) Doreatha Massed, MD as Medical Oncologist (Oncology) Joeseph Amor, RN as Registered Nurse (Oncology)   ASSESSMENT & PLAN:   Assessment: 1.  Gastric antral adenocarcinoma: - EGD on 05/12/2021 gastric antral mass with large cratered ulcer.  Normal duodenal bulb and first part of the duodenum. - Biopsy of the antrum ulcer consistent with adenocarcinoma, at least intramucosal.  Depth of invasion cannot be judged as the biopsy was superficial. - CT CAP on 05/14/2021 with gastric antral soft tissue thickening/mass measuring 3.7 x 3 cm.  1.2 cm portacaval lymph node.  Node within the perigastric fat anterior to the pylorus measures 5 mm.  No evidence of distant metastatic disease in the abdomen or pelvis.  Isolated 4 mm right middle lobe lung nodule most likely incidental/benign. - 13 pound weight loss in the last 3 months. - PET scan on 05/29/2021 showed mass in the gastric antrum FDG avid with SUV 5.32.  No definite evidence of FDG avid nodal metastasis or distant metastatic disease.  There is only low-level FDG uptake associated with previously described prominent upper abdominal lymph nodes. - EGD/EUS on 06/19/2021-uT3N1 4 cm cratered noncircumferential distal gastric adenocarcinoma.  There is one suspicious 9 mm perigastric lymph node nearby the primary gastric mass. - Partial gastrectomy and D1 dissection on 11/03/2021 by Dr. Lovell Sheehan. - Pathology: 1.9 cm grade 2 adenocarcinoma, 0/19 lymph nodes involved, margins negative, ypT2 ypN0.  2.  Social/family history: - He is married and seen with his wife today. - He does Holiday representative work. - He quit smoking  cigarettes 13 years ago.  Quit drinking alcohol in March 2022. - No family history of malignancies.    Plan: 1.  Stage III (uT3 N1) gastric adenocarcinoma: - He is doing fairly well and maintaining his weight.  He does have difficulty eating certain foods like hamburgers. - He had EGD on 11/15/2023 by Dr. Marletta Lor.  It showed 1 cm hiatal hernia, LA grade C reflux esophagitis, anastomosis appears healthy.  It also showed some gastritis. - Biopsies were benign. - He will pick up samples of work once from Dr. Queen Blossom office to see if it helps with acid reflux. - I reviewed labs from 12/21/2023: Normal LFTs, CBC.  Vitamin D, B12 and ferritin levels are normal.  CEA was normal at 1.5. - I reviewed CT CAP from 12/21/2023: No evidence of recurrence or metastatic disease. - Recommend follow-up in 6 months with repeat labs and scan.  2.  Low vitamin D levels: - He is not taking any vitamin D supplements.  Vitamin D level is 36.  No orders of the defined types were placed in this encounter.     Alben Deeds Teague,acting as a Neurosurgeon for Doreatha Massed, MD.,have documented all relevant documentation on the behalf of Doreatha Massed, MD,as directed by  Doreatha Massed, MD while in the presence of Doreatha Massed, MD.  I, Doreatha Massed MD, have reviewed the above documentation for accuracy and completeness, and I agree with the above.    Grayling R [place]   4/8/20259:51 AM  CHIEF COMPLAINT:   Diagnosis: adenocarcinoma of the stomach    Cancer Staging  Gastric cancer [facility]) Staging form: Stomach, AJCC  8th Edition - Clinical stage from 06/19/2021: Stage III (cT3, cN1, cM0) - Unsigned - Pathologic stage from 12/02/2021: Stage I (ypT2, pN0, cM0) - Unsigned    Prior Therapy: Gastroesophageal FLOT every 2 weeks for 4 cycles   Current Therapy:  surveillance    HISTORY OF PRESENT ILLNESS:   Oncology History  Gastric cancer (HCC)  06/19/2021 Initial Diagnosis   Gastric cancer  (HCC)   06/25/2021 - 10/03/2021 Chemotherapy   Patient is on Treatment Plan : GASTROESOPHAGEAL FLOT q14d X 4 cycles        INTERVAL HISTORY:   Steven Martinez is a 33 y.o. male presenting to clinic today for follow up of adenocarcinoma of the stomach. He was last seen by me on 06/29/23.  Since his last visit, he had CT C/A/P on 12/21/23 that found: Prior partial gastrectomy with gastrojejunal anastomosis, no evidence of local recurrence. No evidence of metastatic disease within the chest, abdomen or pelvis.  [place] underwent EGD on 11/15/23 under Dr. Marletta Lor. Pathology of GE junction biopsy revealed: Esophageal squamous mucosa with no specific histopathologic changes, negative for intestinal metaplasia or dysplasia. Stomach biopsy revealed: Gastric oxyntic mucosa with no specific histopathologic changes and Helicobacter pylori-like organisms are not identified on routine HE stain.  Today, he states that he is doing well overall. His appetite level is at 100%. His energy level is at 80%. He is accompanied by his wife.  He is taking the largest dose of Protonix without improvement in acid reflux. Ketrick is not a candidate for surgery due to hernia. He has been prescribed samples of Voquezna.  He reports a normal appetite and denies any nausea or vomiting. He is not taking Vitamin D supplements.   PAST MEDICAL HISTORY:   Past Medical History: Past Medical History:  Diagnosis Date   Acute upper GI bleed 10/13/2020   gastric ulcer with visible vessel   Alcohol abuse 10/13/2020   Quit Jan 2022   Cancer [facility])    GERD (gastroesophageal reflux disease)    Port-A-Cath in place 06/23/2021   Positive H. pylori test 09/2020   H pylori IgG + Jan 2022 s/p treatment with Prevpac. EGD March 2022 with gastric biopsy negative for H. pylori.     Surgical History: Past Surgical History:  Procedure Laterality Date   BIOPSY  12/03/2020   Procedure: BIOPSY;  Surgeon: Lanelle Bal, DO;  [facility];   Service: Endoscopy;;   BIOPSY  05/12/2021   Procedure: BIOPSY;  Surgeon: Lanelle Bal, DO;  [facility];  Service: Endoscopy;;   BIOPSY  11/15/2023   Procedure: BIOPSY;  Surgeon: Lanelle Bal, DO;  [facility];  Service: Endoscopy;;   ESOPHAGOGASTRODUODENOSCOPY (EGD) WITH PROPOFOL N/A 10/14/2020    Surgeon: Lanelle Bal, DO;  one non-bleeding, non-obstructing gastric ulcer with visible vessel s/p bipolar cautery, normal examined esophagus and duodenum.    ESOPHAGOGASTRODUODENOSCOPY (EGD) WITH PROPOFOL N/A 12/03/2020    Surgeon: Lanelle Bal, DO;   nonobstructing, nonbleeding gastric ulcer with clean base s/p biopsied, gastritis biopsied, normal examined duodenum.  Pathology with granulation tissue consistent with ulcer, mild chronic gastritis with focal intestinal metaplasia, H. pylori negative.  Repeat in 3 months.   ESOPHAGOGASTRODUODENOSCOPY (EGD) WITH PROPOFOL N/A 05/12/2021   Procedure: ESOPHAGOGASTRODUODENOSCOPY (EGD) WITH PROPOFOL;  Surgeon: Lanelle Bal, DO;  [facility];  Service: Endoscopy;  Laterality: N/A;  10:30am   ESOPHAGOGASTRODUODENOSCOPY (EGD) WITH PROPOFOL N/A 06/19/2021   Procedure: ESOPHAGOGASTRODUODENOSCOPY (EGD) WITH PROPOFOL;  Surgeon: Rachael Fee, MD;  Location: [facility];  Service: Endoscopy;  Laterality: N/A;   ESOPHAGOGASTRODUODENOSCOPY (EGD) WITH PROPOFOL N/A 11/15/2023   Procedure: ESOPHAGOGASTRODUODENOSCOPY (EGD) WITH PROPOFOL;  Surgeon: Lanelle Bal, DO;  [facility];  Service: Endoscopy;  Laterality: N/A;  945am, asa 2   EUS N/A 06/19/2021   Procedure: UPPER ENDOSCOPIC ULTRASOUND (EUS) RADIAL;  Surgeon: Rachael Fee, MD;  Location: [facility];  Service: Endoscopy;  Laterality: N/A;   GASTRECTOMY N/A 11/03/2021   Procedure: PARTIAL GASTRECTOMY;  Surgeon: Franky Macho, MD;  Location: [facility];  Service: General;  Laterality: N/A;   PORT-A-CATH REMOVAL Left 03/02/2022   Procedure: MINOR  REMOVAL PORT-A-CATH;  Surgeon: Franky Macho, MD;  Location: [facility];  Service: General;  Laterality: Left;  pt to arrive at 6:30am   PORTACATH PLACEMENT Left 06/23/2021   Procedure: INSERTION PORT-A-CATH;  Surgeon: Franky Macho, MD;  Location: [facility];  Service: General;  Laterality: Left;    Social History: Social History   Socioeconomic History   Marital status: Married    Spouse name: Not on file   Number of children: Not on file   Years of education: Not on file   Highest education level: Not on file  Occupational History   Not on file  Tobacco Use   Smoking status: Never   Smokeless tobacco: Never  Vaping Use   Vaping status: Never Used  Substance and Sexual Activity   Alcohol use: Not Currently    Alcohol/week: 57.0 - 58.0 standard drinks of alcohol    Types: 54 Cans of beer, 3 - 4 Shots of liquor per week    Comment: Quit January 2022. Used to drink sixpack daily and 12-15 beers on Saturdays and Sundays.    Drug use: Never   Sexual activity: Yes    Partners: Female  Other Topics Concern   Not on file  Social History Narrative   Not on file   Social Drivers of Health   Financial Resource Strain: High Risk (07/08/2021)   Overall Financial Resource Strain (CARDIA)    Difficulty of Paying Living Expenses: Very hard  Food Insecurity: No Food Insecurity (05/20/2021)   Hunger Vital Sign    Worried About Running Out of Food in the Last Year: Never true    Ran Out of Food in the Last Year: Never true  Transportation Needs: No Transportation Needs (05/20/2021)   PRAPARE - Administrator, Civil Service (Medical): No    Lack of Transportation (Non-Medical): No  Physical Activity: Sufficiently Active (05/20/2021)   Exercise Vital Sign    Days of Exercise per Week: 5 days    Minutes of Exercise per Session: 60 min  Stress: No Stress Concern Present (05/20/2021)   [employer] of Occupational Health - Occupational Stress Questionnaire    Feeling of Stress : Not  at all  Social Connections: Moderately Isolated (05/20/2021)   Social Connection and Isolation Panel [NHANES]    Frequency of Communication with Friends and Family: More than three times a week    Frequency of Social Gatherings with Friends and Family: More than three times a week    Attends Religious Services: Never    Database administrator or Organizations: No    Attends Banker Meetings: Never    Marital Status: Married  Catering manager Violence: Not At Risk (05/20/2021)   Humiliation, Afraid, Rape, and Kick questionnaire    Fear of Current or Ex-Partner: No  Emotionally Abused: No    Physically Abused: No    Sexually Abused: No    Family History: Family History  Problem Relation Age of Onset   Diabetes Mellitus II Mother    Hypertension Mother    Colon cancer Neg Hx    Colon polyps Neg Hx     Current Medications:  Current Outpatient Medications:    dexlansoprazole (DEXILANT) 60 MG capsule, Take 1 capsule (60 mg total) by mouth daily. (Patient not taking: Reported on 12/16/2023), Disp: 30 capsule, Rfl: 11   Allergies: Allergies  Allergen Reactions   Oxaliplatin Itching    REVIEW OF SYSTEMS:   Review of Systems  Constitutional:  Negative for chills, fatigue and fever.  HENT:   Negative for lump/mass, mouth sores, nosebleeds, sore throat and trouble swallowing.   Eyes:  Negative for eye problems.  Respiratory:  Negative for cough and shortness of breath.   Cardiovascular:  Negative for chest pain, leg swelling and palpitations.  Gastrointestinal:  Negative for abdominal pain, constipation, diarrhea, nausea and vomiting.       +acid reflux  Genitourinary:  Negative for bladder incontinence, difficulty urinating, dysuria, frequency, hematuria and nocturia.   Musculoskeletal:  Negative for arthralgias, back pain, flank pain, myalgias and neck pain.  Skin:  Negative for itching and rash.  Neurological:  Negative for dizziness, headaches and numbness.   Hematological:  Does not bruise/bleed easily.  Psychiatric/Behavioral:  Negative for depression, sleep disturbance and suicidal ideas. The patient is not nervous/anxious.   All other systems reviewed and are negative.    VITALS:   There were no vitals taken for this visit.  Wt Readings from Last 3 Encounters:  12/16/23 159 lb 9.6 oz (72.4 kg)  11/15/23 169 lb 6.4 oz (76.8 kg)  11/12/23 169 lb 6.4 oz (76.8 kg)    There is no height or weight on file to calculate BMI.  Performance status (ECOG): 0 - Asymptomatic  PHYSICAL EXAM:   Physical Exam Vitals and nursing note reviewed. Exam conducted with a chaperone present.  Constitutional:      Appearance: Normal appearance.  Cardiovascular:     Rate and Rhythm: Normal rate and regular rhythm.     Pulses: Normal pulses.     Heart sounds: Normal heart sounds.  Pulmonary:     Effort: Pulmonary effort is normal.     Breath sounds: Normal breath sounds.  Abdominal:     Palpations: Abdomen is soft. There is no hepatomegaly, splenomegaly or mass.     Tenderness: There is no abdominal tenderness.  Musculoskeletal:     Right lower leg: No edema.     Left lower leg: No edema.  Lymphadenopathy:     Cervical: No cervical adenopathy.     Right cervical: No superficial, deep or posterior cervical adenopathy.    Left cervical: No superficial, deep or posterior cervical adenopathy.     Upper Body:     Right upper body: No supraclavicular or axillary adenopathy.     Left upper body: No supraclavicular or axillary adenopathy.  Neurological:     General: No focal deficit present.     Mental Status: He is alert and oriented to person, place, and time.  Psychiatric:        Mood and Affect: Mood normal.        Behavior: Behavior normal.     LABS:      Latest Ref Rng & Units 12/21/2023    1:50 PM 06/23/2023    2:53 PM  12/16/2022    3:12 PM  CBC  WBC 4.0 - 10.5 K/uL 4.8  5.6  4.8   Hemoglobin 13.0 - 17.0 g/dL 09.8  11.9  14.7    Hematocrit 39.0 - 52.0 % 48.5  45.2  44.8   Platelets 150 - 400 K/uL 173  185  190       Latest Ref Rng & Units 12/21/2023    1:59 PM 12/21/2023    1:50 PM 06/23/2023    2:53 PM  CMP  Glucose 70 - 99 mg/dL  829  562   BUN 6 - 20 mg/dL  11  12   Creatinine 1.30 - 1.24 mg/dL  8.65  7.84   Sodium 696 - 145 mmol/L  137  136   Potassium 3.5 - 5.1 mmol/L  3.6  3.6   Chloride 98 - 111 mmol/L  100  102   CO2 22 - 32 mmol/L  26  23   Calcium 8.9 - 10.3 mg/dL  9.1  9.3   Total Protein 6.5 - 8.1 g/dL 8.3  8.3  7.7   Total Bilirubin 0.0 - 1.2 mg/dL 0.5  0.6  0.4   Alkaline Phos 38 - 126 U/L 94  95  80   AST 15 - 41 U/L 27  26  27    ALT 0 - 44 U/L 31  31  35      Lab Results  Component Value Date   CEA1 1.5 12/21/2023   /  CEA  Date Value Ref Range Status  12/21/2023 1.5 0.0 - 4.7 ng/mL Final    Comment:    (NOTE)                             Nonsmokers          <3.9                             Smokers             <5.6 Roche [employer] Electrochemiluminescence Immunoassay (ECLIA) Values obtained with different assay methods or kits cannot be used interchangeably.  Results cannot be interpreted as absolute evidence of the presence or absence of malignant disease. Performed At: [facility] [address] [place], [place] [id number] Jolene Schimke MD GM:[id number]    No results found for: "PSA1" No results found for: "CAN199" No results found for: "CAN125"  No results found for: "TOTALPROTELP", "ALBUMINELP", "A1GS", "A2GS", "BETS", "BETA2SER", "GAMS", "MSPIKE", "SPEI" Lab Results  Component Value Date   TIBC 354 12/21/2023   TIBC 329 12/16/2022   TIBC 357 06/11/2022   FERRITIN 51 12/21/2023   FERRITIN 46 12/16/2022   FERRITIN 116 06/11/2022   IRONPCTSAT 22 12/21/2023   IRONPCTSAT 21 12/16/2022   IRONPCTSAT 20 06/11/2022   No results found for: "LDH"   STUDIES:   CT CHEST ABDOMEN PELVIS W CONTRAST Result Date: 12/22/2023 CLINICAL DATA:  History of gastric  cancer, monitor. * Tracking Code: BO * EXAM: CT CHEST, ABDOMEN, AND PELVIS WITH CONTRAST TECHNIQUE: Multidetector CT imaging of the chest, abdomen and pelvis was performed following the standard protocol during bolus administration of intravenous contrast. RADIATION DOSE REDUCTION: This exam was performed according to the departmental dose-optimization program which includes automated exposure control, adjustment of the mA and/or kV according to patient size and/or use of iterative reconstruction technique. CONTRAST:  OMNIPAQUE IOHEXOL 300 MG/ML  SOLN  COMPARISON:  Multiple priors including CT June 23, 2023 FINDINGS: CT CHEST FINDINGS Cardiovascular: Normal caliber thoracic aorta. Normal size heart. No significant pericardial effusion/thickening. Mediastinum/Nodes: Visual cyst no suspicious thyroid nodule. No pathologically enlarged mediastinal, hilar or axillary lymph nodes. Small hiatal hernia. Lungs/Pleura: Unchanged 3 mm right middle lobe pulmonary nodule on image 96/3, stable dating back to 2022 compatible with a benign finding. No new suspicious pulmonary nodules or masses. Musculoskeletal: No aggressive lytic or blastic lesion of bone. CT ABDOMEN PELVIS FINDINGS Hepatobiliary: No suspicious hepatic lesion. Gallbladder is distended without wall thickening or pericholecystic fluid. No biliary ductal dilation. Pancreas: No pancreatic ductal dilation or evidence of acute inflammation. Spleen: No splenomegaly. Adrenals/Urinary Tract: Bilateral adrenal glands appear normal. No hydronephrosis. Kidneys demonstrate symmetric enhancement. Urinary bladder is unremarkable for degree of distension. Stomach/Bowel: Prior partial gastrectomy with gastrojejunal anastomosis, no suspicious nodularity along the suture line. No pathologic dilation of small or large bowel. Noninflamed appendix. Vascular/Lymphatic: Normal caliber abdominal aorta. No pathologically enlarged abdominal or pelvic lymph nodes. Reproductive:  Prostate is unremarkable. Other: No significant abdominopelvic free fluid. No discrete peritoneal or omental nodularity. Musculoskeletal: No aggressive it appears lytic or blastic lesion of bone. IMPRESSION: 1. Prior partial gastrectomy with gastrojejunal anastomosis, no evidence of local recurrence. 2. No evidence of metastatic disease within the chest, abdomen or pelvis. Electronically Signed   By: Maudry Mayhew M.D.   On: 12/22/2023 15:47   US Abdomen Limited RUQ (LIVER/GB) Result Date: 12/01/2023 CLINICAL DATA:  Right upper quadrant pain EXAM: ULTRASOUND ABDOMEN LIMITED RIGHT UPPER QUADRANT COMPARISON:  CT abdomen and pelvis 09/11/2021 FINDINGS: Gallbladder: Cholelithiasis. No gallbladder wall thickening or pericholecystic fluid. Negative sonographic Murphy's sign. Common bile duct: Diameter: 2.9 mm Liver: No focal lesion identified. Within normal limits in parenchymal echogenicity. Portal vein is patent on color Doppler imaging with normal direction of blood flow towards the liver. Other: None. IMPRESSION: Cholelithiasis without secondary signs of acute cholecystitis. Electronically Signed   By: Annia Belt M.D.   On: 12/01/2023 21:53

## 2023-12-28 NOTE — Patient Instructions (Signed)
 [facility] at [facility] Discharge Instructions   You were seen and examined today by Dr. Ellin Saba.  He reviewed the results of your lab work which are normal/stable.   He reviewed the results of your CT scan which did not show any evidence of cancer.   We will see you back in .   Return as scheduled.    Thank you for choosing [facility] at [facility] to provide your oncology and hematology care.  To afford each patient quality time with our provider, please arrive at least 15 minutes before your scheduled appointment time.   If you have a lab appointment with the Cancer Center please come in thru the Main Entrance and check in at the main information desk.  You need to re-schedule your appointment should you arrive 10 or more minutes late.  We strive to give you quality time with our providers, and arriving late affects you and other patients whose appointments are after yours.  Also, if you no show three or more times for appointments you may be dismissed from the clinic at the providers discretion.     Again, thank you for choosing [facility]  Our hope is that these requests will decrease the amount of time that you wait before being seen by our physicians.       _____________________________________________________________  Should you have questions after your visit to [facility], please contact our office at [phone] and follow the prompts.  Our office hours are 8:00 a.m. and 4:30 p.m. Monday - Friday.  Please note that voicemails left after 4:00 p.m. may not be returned until the following business day.  We are closed weekends and major holidays.  You do have access to a nurse 24-7, just call the main number to the clinic [phone] and do not press any options, hold on the line and a nurse will answer the phone.    For prescription refill requests, have your pharmacy contact our office and allow 72  hours.    Due to Covid, you will need to wear a mask upon entering the hospital. If you do not have a mask, a mask will be given to you at the Main Entrance upon arrival. For doctor visits, patients may have 1 support person age 62 or older with them. For treatment visits, patients can not have anyone with them due to social distancing guidelines and our immunocompromised population.

## 2023-12-29 LAB — IMMUNOGLOBULINS A/E/G/M, SERUM
IgA: 261 mg/dL (ref 90–386)
IgE (Immunoglobulin E), Serum: 305 [IU]/mL (ref 6–495)
IgG (Immunoglobin G), Serum: 1492 mg/dL (ref 603–1613)
IgM (Immunoglobulin M), Srm: 150 mg/dL (ref 20–172)

## 2023-12-29 NOTE — Progress Notes (Signed)
 Steven Martinez; [id number]; [birth year]   HPI Patient is a 33 year old Hispanic male who was referred to my care by Dr. Marletta Lor for evaluation and treatment of cholelithiasis.  Patient was seen by Dr. Marletta Lor due to a history of elevated LFTs that were found by his primary care physician when he was having episodes of epigastric pain and reflux.  He is status post a partial gastrectomy with Roux-en-Y reconstruction for gastric carcinoma.  Repeat liver enzyme test done by Dr. Marletta Lor were unremarkable.  There was the possibility that this was a viral gastroenteritis episode that caused the elevated enzyme tests.  Ultrasound the gallbladder does reveal cholelithiasis.  Patient denies any right upper quadrant abdominal pain.  He primarily is concerned about his reflux.  A recent EGD in February of this year showed esophagitis.  A wide open anastomosis was noted.  Patient states his reflux has been present since his surgery. Past Medical History:  Diagnosis Date   Acute upper GI bleed 10/13/2020   gastric ulcer with visible vessel   Alcohol abuse 10/13/2020   Quit Jan 2022   Cancer [facility])    GERD (gastroesophageal reflux disease)    Port-A-Cath in place 06/23/2021   Positive H. pylori test 09/2020   H pylori IgG + Jan 2022 s/p treatment with Prevpac. EGD March 2022 with gastric biopsy negative for H. pylori.     Past Surgical History:  Procedure Laterality Date   BIOPSY  12/03/2020   Procedure: BIOPSY;  Surgeon: Lanelle Bal, DO;  [facility];  Service: Endoscopy;;   BIOPSY  05/12/2021   Procedure: BIOPSY;  Surgeon: Lanelle Bal, DO;  [facility];  Service: Endoscopy;;   BIOPSY  11/15/2023   Procedure: BIOPSY;  Surgeon: Lanelle Bal, DO;  [facility];  Service: Endoscopy;;   ESOPHAGOGASTRODUODENOSCOPY (EGD) WITH PROPOFOL N/A 10/14/2020    Surgeon: Lanelle Bal, DO;  one non-bleeding, non-obstructing gastric ulcer with visible vessel s/p bipolar  cautery, normal examined esophagus and duodenum.    ESOPHAGOGASTRODUODENOSCOPY (EGD) WITH PROPOFOL N/A 12/03/2020    Surgeon: Lanelle Bal, DO;   nonobstructing, nonbleeding gastric ulcer with clean base s/p biopsied, gastritis biopsied, normal examined duodenum.  Pathology with granulation tissue consistent with ulcer, mild chronic gastritis with focal intestinal metaplasia, H. pylori negative.  Repeat in 3 months.   ESOPHAGOGASTRODUODENOSCOPY (EGD) WITH PROPOFOL N/A 05/12/2021   Procedure: ESOPHAGOGASTRODUODENOSCOPY (EGD) WITH PROPOFOL;  Surgeon: Lanelle Bal, DO;  [facility];  Service: Endoscopy;  Laterality: N/A;  10:30am   ESOPHAGOGASTRODUODENOSCOPY (EGD) WITH PROPOFOL N/A 06/19/2021   Procedure: ESOPHAGOGASTRODUODENOSCOPY (EGD) WITH PROPOFOL;  Surgeon: Rachael Fee, MD;  [facility];  Service: Endoscopy;  Laterality: N/A;   ESOPHAGOGASTRODUODENOSCOPY (EGD) WITH PROPOFOL N/A 11/15/2023   Procedure: ESOPHAGOGASTRODUODENOSCOPY (EGD) WITH PROPOFOL;  Surgeon: Lanelle Bal, DO;  [facility];  Service: Endoscopy;  Laterality: N/A;  945am, asa 2   EUS N/A 06/19/2021   Procedure: UPPER ENDOSCOPIC ULTRASOUND (EUS) RADIAL;  Surgeon: Rachael Fee, MD;  [facility];  Service: Endoscopy;  Laterality: N/A;   GASTRECTOMY N/A 11/03/2021   Procedure: PARTIAL GASTRECTOMY;  Surgeon: Franky Macho, MD;  [facility];  Service: General;  Laterality: N/A;   PORT-A-CATH REMOVAL Left 03/02/2022   Procedure: MINOR REMOVAL PORT-A-CATH;  Surgeon: Franky Macho, MD;  [facility]: [facility];  Service: General;  Laterality: Left;  pt to arrive at 6:30am   PORTACATH PLACEMENT Left 06/23/2021  Procedure: INSERTION PORT-A-CATH;  Surgeon: Franky Macho, MD;  [facility]: [facility];  Service: General;  Laterality: Left;    Family History  Problem Relation Age of Onset   Diabetes Mellitus II Mother    Hypertension Mother    Colon cancer Neg Hx    Colon polyps Neg Hx      No current outpatient medications on file prior to visit.   No current facility-administered medications on file prior to visit.    Allergies  Allergen Reactions   Oxaliplatin Itching    Social History   Substance and Sexual Activity  Alcohol Use Not Currently   Alcohol/week: 57.0 - 58.0 standard drinks of alcohol   Types: 54 Cans of beer, 3 - 4 Shots of liquor per week   Comment: Quit January 2022. Used to drink sixpack daily and 12-15 beers on Saturdays and Sundays.     Social History   Tobacco Use  Smoking Status Never  Smokeless Tobacco Never    Review of Systems  Constitutional: Negative.   HENT: Negative.    Eyes: Negative.   Respiratory: Negative.    Cardiovascular: Negative.   Gastrointestinal:  Positive for abdominal pain and heartburn.  Genitourinary: Negative.   Musculoskeletal: Negative.   Skin: Negative.   Neurological: Negative.   Endo/Heme/Allergies: Negative.   Psychiatric/Behavioral: Negative.      Objective   Vitals:   12/28/23 1300  BP: 131/85  Pulse: 82  Resp: 14  Temp: 97.6 F (36.4 C)  SpO2: 98%    Physical Exam Vitals reviewed.  Constitutional:      Appearance: Normal appearance. He is normal weight. He is not ill-appearing.  HENT:     Head: Normocephalic and atraumatic.  Eyes:     General: No scleral icterus. Cardiovascular:     Rate and Rhythm: Normal rate and regular rhythm.     Heart sounds: Normal heart sounds. No murmur heard.    No friction rub. No gallop.  Pulmonary:     Effort: Pulmonary effort is normal. No respiratory distress.     Breath sounds: Normal breath sounds. No stridor. No wheezing, rhonchi or rales.  Abdominal:     General: Abdomen is flat. Bowel sounds are normal. There is no distension.     Palpations: Abdomen is soft. There is no mass.     Tenderness: There is no abdominal tenderness. There is no guarding or rebound.     Hernia: No hernia is present.  Skin:    General: Skin is warm and dry.   Neurological:     Mental Status: He is alert and oriented to person, place, and time.   Dr. Queen Blossom notes were reviewed  Assessment  Cholelithiasis with normal LFTs.  Patient does not describe any particular symptoms of cholelithiasis.  He does have reflux disease secondary to his gastric cancer surgery.  He is being tried on a new medication for this.  I doubt cholecystectomy is warranted at this time.  This was discussed with the patient.  Should he develop more symptoms of biliary colic, he was instructed to return to my care.  He understands and agrees. Plan  Will follow expectantly at the present time.  He will return should he develop more symptoms of biliary colic.

## 2024-04-04 ENCOUNTER — Encounter (HOSPITAL_COMMUNITY): Payer: Self-pay | Admitting: Hematology

## 2024-06-27 ENCOUNTER — Ambulatory Visit (HOSPITAL_COMMUNITY)
Admission: RE | Admit: 2024-06-27 | Discharge: 2024-06-27 | Disposition: A | Discharge status: Home / Self Care | Payer: PRIVATE HEALTH INSURANCE | Source: Ambulatory Visit | Attending: Hematology | Admitting: Hematology

## 2024-06-27 ENCOUNTER — Inpatient Hospital Stay: Payer: PRIVATE HEALTH INSURANCE | Attending: Oncology

## 2024-06-27 DIAGNOSIS — D509 Iron deficiency anemia, unspecified: Secondary | ICD-10-CM | POA: Insufficient documentation

## 2024-06-27 DIAGNOSIS — K449 Diaphragmatic hernia without obstruction or gangrene: Secondary | ICD-10-CM | POA: Insufficient documentation

## 2024-06-27 DIAGNOSIS — M129 Arthropathy, unspecified: Secondary | ICD-10-CM | POA: Insufficient documentation

## 2024-06-27 DIAGNOSIS — C169 Malignant neoplasm of stomach, unspecified: Secondary | ICD-10-CM | POA: Insufficient documentation

## 2024-06-27 DIAGNOSIS — K219 Gastro-esophageal reflux disease without esophagitis: Secondary | ICD-10-CM | POA: Insufficient documentation

## 2024-06-27 DIAGNOSIS — Z85028 Personal history of other malignant neoplasm of stomach: Secondary | ICD-10-CM | POA: Insufficient documentation

## 2024-06-27 DIAGNOSIS — K2970 Gastritis, unspecified, without bleeding: Secondary | ICD-10-CM | POA: Insufficient documentation

## 2024-06-27 LAB — CBC WITH DIFFERENTIAL/PLATELET
Abs Immature Granulocytes: 0.02 K/uL (ref 0.00–0.07)
Basophils Absolute: 0 K/uL (ref 0.0–0.1)
Basophils Relative: 1 %
Eosinophils Absolute: 0.1 K/uL (ref 0.0–0.5)
Eosinophils Relative: 2 %
HCT: 45 % (ref 39.0–52.0)
Hemoglobin: 15.4 g/dL (ref 13.0–17.0)
Immature Granulocytes: 0 %
Lymphocytes Relative: 32 %
Lymphs Abs: 1.9 K/uL (ref 0.7–4.0)
MCH: 28.7 pg (ref 26.0–34.0)
MCHC: 34.2 g/dL (ref 30.0–36.0)
MCV: 83.8 fL (ref 80.0–100.0)
Monocytes Absolute: 0.3 K/uL (ref 0.1–1.0)
Monocytes Relative: 6 %
Neutro Abs: 3.6 K/uL (ref 1.7–7.7)
Neutrophils Relative %: 59 %
Platelets: 166 K/uL (ref 150–400)
RBC: 5.37 MIL/uL (ref 4.22–5.81)
RDW: 13 % (ref 11.5–15.5)
WBC: 6 K/uL (ref 4.0–10.5)
nRBC: 0 % (ref 0.0–0.2)

## 2024-06-27 LAB — IRON AND TIBC
Iron: 55 ug/dL (ref 45–182)
Saturation Ratios: 18 % (ref 17.9–39.5)
TIBC: 315 ug/dL (ref 250–450)
UIBC: 260 ug/dL

## 2024-06-27 LAB — COMPREHENSIVE METABOLIC PANEL WITH GFR
ALT: 24 U/L (ref 0–44)
AST: 28 U/L (ref 15–41)
Albumin: 4.7 g/dL (ref 3.5–5.0)
Alkaline Phosphatase: 102 U/L (ref 38–126)
Anion gap: 11 (ref 5–15)
BUN: 16 mg/dL (ref 6–20)
CO2: 24 mmol/L (ref 22–32)
Calcium: 9.4 mg/dL (ref 8.9–10.3)
Chloride: 104 mmol/L (ref 98–111)
Creatinine, Ser: 0.85 mg/dL (ref 0.61–1.24)
GFR, Estimated: >60 mL/min (ref >60)
Glucose, Bld: 114 mg/dL — ABNORMAL HIGH (ref 70–99)
Potassium: 4 mmol/L (ref 3.5–5.1)
Sodium: 139 mmol/L (ref 135–145)
Total Bilirubin: 0.4 mg/dL (ref 0.0–1.2)
Total Protein: 7.7 g/dL (ref 6.5–8.1)

## 2024-06-27 LAB — VITAMIN B12: Vitamin B-12: 919 pg/mL — ABNORMAL HIGH (ref 180–914)

## 2024-06-27 LAB — FERRITIN: Ferritin: 91 ng/mL (ref 24–336)

## 2024-06-27 MED ORDER — IOHEXOL 9 MG/ML PO SOLN
ORAL | Status: AC
  Filled 2024-06-27: qty 1000

## 2024-06-27 MED ORDER — IOHEXOL 300 MG/ML  SOLN
100.0000 mL | Freq: Once | INTRAMUSCULAR | Status: AC | PRN
Start: 2024-06-27 — End: 2024-06-27
  Administered 2024-06-27: 100 mL via INTRAVENOUS

## 2024-06-27 MED ORDER — IOHEXOL 9 MG/ML PO SOLN
500.0000 mL | ORAL | Status: AC
  Administered 2024-06-27: 500 mL via ORAL

## 2024-06-28 LAB — CEA: CEA: 1.7 ng/mL (ref 0.0–4.7)

## 2024-07-05 ENCOUNTER — Inpatient Hospital Stay: Payer: PRIVATE HEALTH INSURANCE | Admitting: Oncology

## 2024-07-05 VITALS — BP 129/96 | HR 70 | Temp 99.1°F | Resp 18 | Ht 66.0 in | Wt 157.0 lb

## 2024-07-05 DIAGNOSIS — C169 Malignant neoplasm of stomach, unspecified: Secondary | ICD-10-CM

## 2024-07-05 DIAGNOSIS — D509 Iron deficiency anemia, unspecified: Secondary | ICD-10-CM

## 2024-07-05 NOTE — Progress Notes (Signed)
 [facility] OFFICE PROGRESS NOTE  [place], Steven BROCKS, Steven Martinez  ASSESSMENT & PLAN:    Assessment & Plan Malignant neoplasm of stomach, unspecified location [facility]) - He is doing fairly well and maintaining his weight.  He does have difficulty eating certain foods like hamburgers. - He had EGD on 11/15/2023 by Dr. Cindie.  It showed 1 cm hiatal hernia, LA grade C reflux esophagitis, anastomosis appears healthy.  It also showed some gastritis. - Biopsies were benign. -Most recent CT scan from 06/27/2024 shows postsurgical change of prior partial gastrectomy with gastrojejunal anastomosis.  New soft tissue nodularity along the anterior margin of the suture line nonspecific.  Recommends further workup with EGD.  Small bowel intussusception just downstream from suture line which is commonly transient however given patient's history would suggest attention on short-term interval follow-up imaging and assessment at upper endoscopy if performed. -We discussed most recent labs which showed unremarkable CBC, normal CEA 1.7 normal metabolic panel.  Vitamin B12 919.  He is currently not taking vitamin B12 supplements. -We discussed referral back to Dr. Cindie for upper endoscopy given above CT scan results. -Return to clinic in 6 months with labs a few days before. Iron deficiency anemia, unspecified iron deficiency anemia type CBC shows hemoglobin. Patient denies any melena, hematochezia or bright red blood per rectum. He is not currently taking iron supplements. No need for additional workup at this time.  Orders Placed This Encounter  Procedures   CT CHEST ABDOMEN PELVIS W CONTRAST    Standing Status:   Future    Expected Date:   12/27/2024    Expiration Date:   07/05/2025    If indicated for the ordered procedure, I authorize the administration of contrast media per Radiology protocol:   Yes    Does the patient have a contrast media/X-ray dye allergy?:   No    Preferred imaging location?:    [facility]    Release to patient:   Immediate    If indicated for the ordered procedure, I authorize the administration of oral contrast media per Radiology protocol:   Yes   CBC with Differential    Standing Status:   Future    Expected Date:   01/03/2025    Expiration Date:   07/05/2025   Comprehensive metabolic panel    Standing Status:   Future    Expected Date:   01/03/2025    Expiration Date:   07/05/2025   CEA    Standing Status:   Future    Expected Date:   01/03/2025    Expiration Date:   07/05/2025   Iron and TIBC ([facility] ONLY)    Standing Status:   Future    Expected Date:   01/03/2025    Expiration Date:   07/05/2025   Ferritin    Standing Status:   Future    Expected Date:   01/03/2025    Expiration Date:   07/05/2025   Vitamin B12    Standing Status:   Future    Expected Date:   01/03/2025    Expiration Date:   07/05/2025    INTERVAL HISTORY: Patient returns for follow-up.  He presents today with his wife.  Reports appetite 100% energy levels are 85%.  Denies any pain.  Reports he overall feels well.  We reviewed CBC, CMP and CEA.  SUMMARY OF HEMATOLOGIC HISTORY: Oncology History Overview Note  1.  Gastric antral adenocarcinoma: - EGD on 05/12/2021 gastric antral mass with  large cratered ulcer.  Normal duodenal bulb and first part of the duodenum. - Biopsy of the antrum ulcer consistent with adenocarcinoma, at least intramucosal.  Depth of invasion cannot be judged as the biopsy was superficial. - CT CAP on 05/14/2021 with gastric antral soft tissue thickening/mass measuring 3.7 x 3 cm.  1.2 cm portacaval lymph node.  Node within the perigastric fat anterior to the pylorus measures 5 mm.  No evidence of distant metastatic disease in the abdomen or pelvis.  Isolated 4 mm right middle lobe lung nodule most likely incidental/benign. - 13 pound weight loss in the last 3 months. - PET scan on 05/29/2021 showed mass in the gastric antrum FDG avid  with SUV 5.32.  No definite evidence of FDG avid nodal metastasis or distant metastatic disease.  There is only low-level FDG uptake associated with previously described prominent upper abdominal lymph nodes. - EGD/EUS on 06/19/2021-uT3N1 4 cm cratered noncircumferential distal gastric adenocarcinoma.  There is one suspicious 9 mm perigastric lymph node nearby the primary gastric mass. - Partial gastrectomy and D1 dissection on 11/03/2021 by Dr. Mavis. - Pathology: 1.9 cm grade 2 adenocarcinoma, 0/19 lymph nodes involved, margins negative, ypT2 ypN0.  2.  Social/family history: - He is married and seen with his wife today. - He does Holiday representative work. - He quit smoking cigarettes 13 years ago.  Quit drinking alcohol in March 2022. - No family history of malignancies.    Gastric cancer (HCC)  06/19/2021 Initial Diagnosis   Gastric cancer (HCC)   06/25/2021 - 10/03/2021 Chemotherapy   Patient is on Treatment Plan : GASTROESOPHAGEAL FLOT q14d X 4 cycles        CBC    Component Value Date/Time   WBC 6.0 06/27/2024 1442   RBC 5.37 06/27/2024 1442   HGB 15.4 06/27/2024 1442   HCT 45.0 06/27/2024 1442   PLT 166 06/27/2024 1442   MCV 83.8 06/27/2024 1442   MCH 28.7 06/27/2024 1442   MCHC 34.2 06/27/2024 1442   RDW 13.0 06/27/2024 1442   LYMPHSABS 1.9 06/27/2024 1442   MONOABS 0.3 06/27/2024 1442   EOSABS 0.1 06/27/2024 1442   BASOSABS 0.0 06/27/2024 1442       Latest Ref Rng & Units 06/27/2024    2:42 PM 12/21/2023    1:59 PM 12/21/2023    1:50 PM  CMP  Glucose 70 - 99 mg/dL 885   894   BUN 6 - 20 mg/dL 16   11   Creatinine 9.38 - 1.24 mg/dL 9.14   9.14   Sodium 864 - 145 mmol/L 139   137   Potassium 3.5 - 5.1 mmol/L 4.0   3.6   Chloride 98 - 111 mmol/L 104   100   CO2 22 - 32 mmol/L 24   26   Calcium  8.9 - 10.3 mg/dL 9.4   9.1   Total Protein 6.5 - 8.1 g/dL 7.7  8.3  8.3   Total Bilirubin 0.0 - 1.2 mg/dL 0.4  0.5  0.6   Alkaline Phos 38 - 126 U/L 102  94  95   AST 15 - 41 U/L  28  27  26    ALT 0 - 44 U/L 24  31  31       Lab Results  Component Value Date   FERRITIN 91 06/27/2024   VITAMINB12 919 (H) 06/27/2024    Vitals:   07/05/24 1414 07/05/24 1426  BP: (!) 145/101 (!) 129/96  Pulse: 70   Resp: 18  Temp: 99.1 F (37.3 C)   SpO2: 100%     Review of System:  Review of Systems  Constitutional:  Positive for malaise/fatigue.  Gastrointestinal:  Negative for abdominal pain, blood in stool, constipation, diarrhea, melena, nausea and vomiting.    Physical Exam: Physical Exam Constitutional:      Appearance: Normal appearance.  HENT:     Head: Normocephalic and atraumatic.  Eyes:     Pupils: Pupils are equal, round, and reactive to light.  Cardiovascular:     Rate and Rhythm: Normal rate and regular rhythm.     Heart sounds: Normal heart sounds. No murmur heard. Pulmonary:     Effort: Pulmonary effort is normal.     Breath sounds: Normal breath sounds. No wheezing.  Abdominal:     General: Bowel sounds are normal. There is no distension.     Palpations: Abdomen is soft.     Tenderness: There is no abdominal tenderness.  Musculoskeletal:        General: Normal range of motion.     Cervical back: Normal range of motion.  Skin:    General: Skin is warm and dry.     Findings: No rash.  Neurological:     Mental Status: He is alert and oriented to person, place, and time.     Gait: Gait is intact.  Psychiatric:        Mood and Affect: Mood and affect normal.        Cognition and Memory: Memory normal.        Judgment: Judgment normal.      I spent 20 minutes dedicated to the care of this patient (face-to-face and non-face-to-face) on the date of the encounter to include what is described in the assessment and plan.,  Delon Hope, NP 07/05/2024 3:05 PM

## 2024-07-05 NOTE — Assessment & Plan Note (Addendum)
-   He is doing fairly well and maintaining his weight.  He does have difficulty eating certain foods like hamburgers. - He had EGD on 11/15/2023 by Dr. Cindie.  It showed 1 cm hiatal hernia, LA grade C reflux esophagitis, anastomosis appears healthy.  It also showed some gastritis. - Biopsies were benign. -Most recent CT scan from 06/27/2024 shows postsurgical change of prior partial gastrectomy with gastrojejunal anastomosis.  New soft tissue nodularity along the anterior margin of the suture line nonspecific.  Recommends further workup with EGD.  Small bowel intussusception just downstream from suture line which is commonly transient however given patient's history would suggest attention on short-term interval follow-up imaging and assessment at upper endoscopy if performed. -We discussed most recent labs which showed unremarkable CBC, normal CEA 1.7 normal metabolic panel.  Vitamin B12 919.  He is currently not taking vitamin B12 supplements. -We discussed referral back to Dr. Cindie for upper endoscopy given above CT scan results. -Return to clinic in 6 months with labs a few days before.

## 2024-07-05 NOTE — Assessment & Plan Note (Signed)
 CBC shows hemoglobin. Patient denies any melena, hematochezia or bright red blood per rectum. He is not currently taking iron supplements. No need for additional workup at this time.

## 2024-07-21 ENCOUNTER — Telehealth: Payer: Self-pay | Admitting: *Deleted

## 2024-07-21 NOTE — Telephone Encounter (Signed)
 Pt's wife left vm stating he was seen by oncology and was told he needed an EGD. She says she hasn't heard anything. Message sent to Dr.Carver

## 2024-07-21 NOTE — Telephone Encounter (Signed)
 Advised pt's wife Zane (on dpr) that Dr.Carver is out of office today, I will send him a message on Monday in regards to getting pt scheduled. She verbalized understanding.

## 2024-07-24 ENCOUNTER — Encounter: Payer: Self-pay | Admitting: *Deleted

## 2024-07-24 NOTE — Telephone Encounter (Signed)
 LMOVM to return call    Per Dr.Carver: okay to just schedule, ASA 2 thank you

## 2024-07-24 NOTE — Telephone Encounter (Signed)
 Pt has been scheduled for 07/31/24. Instructions sent via mychart

## 2024-07-28 ENCOUNTER — Encounter (HOSPITAL_COMMUNITY)
Admission: RE | Admit: 2024-07-28 | Discharge: 2024-07-28 | Disposition: A | Discharge status: Home / Self Care | Payer: Self-pay | Source: Ambulatory Visit | Attending: Internal Medicine | Admitting: Internal Medicine

## 2024-07-31 ENCOUNTER — Ambulatory Visit (HOSPITAL_COMMUNITY): Payer: Self-pay | Admitting: Anesthesiology

## 2024-07-31 ENCOUNTER — Encounter (HOSPITAL_COMMUNITY): Payer: Self-pay | Admitting: Internal Medicine

## 2024-07-31 ENCOUNTER — Encounter (HOSPITAL_COMMUNITY)
Admission: RE | Disposition: A | Discharge status: Home / Self Care | Payer: Self-pay | Source: Home / Self Care | Attending: Internal Medicine

## 2024-07-31 ENCOUNTER — Ambulatory Visit (HOSPITAL_COMMUNITY)
Admission: RE | Admit: 2024-07-31 | Discharge: 2024-07-31 | Disposition: A | Discharge status: Home / Self Care | Payer: Self-pay | Attending: Internal Medicine | Admitting: Internal Medicine

## 2024-07-31 DIAGNOSIS — K449 Diaphragmatic hernia without obstruction or gangrene: Secondary | ICD-10-CM

## 2024-07-31 DIAGNOSIS — I10 Essential (primary) hypertension: Secondary | ICD-10-CM | POA: Insufficient documentation

## 2024-07-31 DIAGNOSIS — K209 Esophagitis, unspecified without bleeding: Secondary | ICD-10-CM

## 2024-07-31 DIAGNOSIS — R933 Abnormal findings on diagnostic imaging of other parts of digestive tract: Secondary | ICD-10-CM

## 2024-07-31 DIAGNOSIS — K317 Polyp of stomach and duodenum: Secondary | ICD-10-CM

## 2024-07-31 DIAGNOSIS — Z903 Acquired absence of stomach [part of]: Secondary | ICD-10-CM | POA: Insufficient documentation

## 2024-07-31 DIAGNOSIS — K3189 Other diseases of stomach and duodenum: Secondary | ICD-10-CM | POA: Insufficient documentation

## 2024-07-31 DIAGNOSIS — C169 Malignant neoplasm of stomach, unspecified: Secondary | ICD-10-CM

## 2024-07-31 DIAGNOSIS — Z8509 Personal history of malignant neoplasm of other digestive organs: Secondary | ICD-10-CM

## 2024-07-31 DIAGNOSIS — K2100 Gastro-esophageal reflux disease with esophagitis, without bleeding: Secondary | ICD-10-CM | POA: Insufficient documentation

## 2024-07-31 HISTORY — PX: ESOPHAGOGASTRODUODENOSCOPY: SHX5428

## 2024-07-31 SURGERY — EGD (ESOPHAGOGASTRODUODENOSCOPY)
Anesthesia: Monitor Anesthesia Care

## 2024-07-31 MED ORDER — PROPOFOL 500 MG/50ML IV EMUL
INTRAVENOUS | Status: DC | PRN
Start: 2024-07-31 — End: 2024-07-31
  Administered 2024-07-31: 50 mg via INTRAVENOUS
  Administered 2024-07-31: 100 mg via INTRAVENOUS
  Administered 2024-07-31 (×2): 50 mg via INTRAVENOUS

## 2024-07-31 MED ORDER — STERILE WATER FOR IRRIGATION IR SOLN
Status: DC | PRN
  Administered 2024-07-31: 60 mL

## 2024-07-31 MED ORDER — DEXMEDETOMIDINE HCL IN NACL 80 MCG/20ML IV SOLN
INTRAVENOUS | Status: DC | PRN
  Administered 2024-07-31 (×2): 8 ug via INTRAVENOUS

## 2024-07-31 MED ORDER — LIDOCAINE 2% (20 MG/ML) 5 ML SYRINGE
INTRAMUSCULAR | Status: DC | PRN
  Administered 2024-07-31: 60 mg via INTRAVENOUS

## 2024-07-31 MED ORDER — LACTATED RINGERS IV SOLN: INTRAVENOUS | Status: DC

## 2024-07-31 NOTE — H&P (Signed)
 Primary Care Physician:  Alston Silvio BROCKS, FNP Primary Gastroenterologist:  Dr. Cindie  Pre-Procedure History & Physical: HPI:  Steven Martinez is a 33 y.o. male is here or an EGD to be performed for history of gastric adenocarcinoma, abnormal CT scan of the stomach.   Past Medical History:  Diagnosis Date   Acute upper GI bleed 10/13/2020   gastric ulcer with visible vessel   Alcohol abuse 10/13/2020   Quit Jan 2022   Cancer [facility])    GERD (gastroesophageal reflux disease)    Port-A-Cath in place 06/23/2021   Positive H. pylori test 09/2020   H pylori IgG + Jan 2022 s/p treatment with Prevpac. EGD March 2022 with gastric biopsy negative for H. pylori.     Past Surgical History:  Procedure Laterality Date   BIOPSY  12/03/2020   Procedure: BIOPSY;  Surgeon: Cindie Carlin POUR, DO;  [facility];  Service: Endoscopy;;   BIOPSY  05/12/2021   Procedure: BIOPSY;  Surgeon: Cindie Carlin POUR, DO;  [facility];  Service: Endoscopy;;   BIOPSY  11/15/2023   Procedure: BIOPSY;  Surgeon: Cindie Carlin POUR, DO;  [facility];  Service: Endoscopy;;   ESOPHAGOGASTRODUODENOSCOPY (EGD) WITH PROPOFOL  N/A 10/14/2020    Surgeon: Cindie Carlin POUR, DO;  one non-bleeding, non-obstructing gastric ulcer with visible vessel s/p bipolar cautery, normal examined esophagus and duodenum.    ESOPHAGOGASTRODUODENOSCOPY (EGD) WITH PROPOFOL  N/A 12/03/2020    Surgeon: Cindie Carlin POUR, DO;   nonobstructing, nonbleeding gastric ulcer with clean base s/p biopsied, gastritis biopsied, normal examined duodenum.  Pathology with granulation tissue consistent with ulcer, mild chronic gastritis with focal intestinal metaplasia, H. pylori negative.  Repeat in 3 months.   ESOPHAGOGASTRODUODENOSCOPY (EGD) WITH PROPOFOL  N/A 05/12/2021   Procedure: ESOPHAGOGASTRODUODENOSCOPY (EGD) WITH PROPOFOL ;  Surgeon: Cindie Carlin POUR, DO;  [facility];  Service: Endoscopy;  Laterality: N/A;  10:30am    ESOPHAGOGASTRODUODENOSCOPY (EGD) WITH PROPOFOL  N/A 06/19/2021   Procedure: ESOPHAGOGASTRODUODENOSCOPY (EGD) WITH PROPOFOL ;  Surgeon: Teressa Toribio SQUIBB, MD;  [facility];  Service: Endoscopy;  Laterality: N/A;   ESOPHAGOGASTRODUODENOSCOPY (EGD) WITH PROPOFOL  N/A 11/15/2023   Procedure: ESOPHAGOGASTRODUODENOSCOPY (EGD) WITH PROPOFOL ;  Surgeon: Cindie Carlin POUR, DO;  [facility];  Service: Endoscopy;  Laterality: N/A;  945am, asa 2   EUS N/A 06/19/2021   Procedure: UPPER ENDOSCOPIC ULTRASOUND (EUS) RADIAL;  Surgeon: Teressa Toribio SQUIBB, MD;  [facility];  Service: Endoscopy;  Laterality: N/A;   GASTRECTOMY N/A 11/03/2021   Procedure: PARTIAL GASTRECTOMY;  Surgeon: Mavis Anes, MD;  [facility];  Service: General;  Laterality: N/A;   PORT-A-CATH REMOVAL Left 03/02/2022   Procedure: MINOR REMOVAL PORT-A-CATH;  Surgeon: Mavis Anes, MD;  Location: [facility];  Service: General;  Laterality: Left;  pt to arrive at 6:30am   PORTACATH PLACEMENT Left 06/23/2021   Procedure: INSERTION PORT-A-CATH;  Surgeon: Mavis Anes, MD;  [facility]: [facility];  Service: General;  Laterality: Left;    Prior to Admission medications   Not on File    Allergies as of 07/24/2024 - Review Complete 07/05/2024  Allergen Reaction Noted   Oxaliplatin  Itching 09/04/2021    Family History  Problem Relation Age of Onset   Diabetes Mellitus II Mother    Hypertension Mother    Colon cancer Neg Hx    Colon polyps Neg Hx     Social History   Socioeconomic History   Marital status: Married    Spouse name: Not on  file   Number of children: Not on file   Years of education: Not on file   Highest education level: Not on file  Occupational History   Not on file  Tobacco Use   Smoking status: Never   Smokeless tobacco: Never  Vaping Use   Vaping status: Never Used  Substance and Sexual Activity   Alcohol use: Not Currently    Alcohol/week: 57.0 - 58.0 standard drinks of alcohol    Types:  54 Cans of beer, 3 - 4 Shots of liquor per week    Comment: Quit January 2022. Used to drink sixpack daily and 12-15 beers on Saturdays and Sundays.    Drug use: Never   Sexual activity: Yes    Partners: Female  Other Topics Concern   Not on file  Social History Narrative   Not on file   Social Drivers of Health   Financial Resource Strain: High Risk (07/08/2021)   Overall Financial Resource Strain (CARDIA)    Difficulty of Paying Living Expenses: Very hard  Food Insecurity: No Food Insecurity (05/20/2021)   Hunger Vital Sign    Worried About Running Out of Food in the Last Year: Never true    Ran Out of Food in the Last Year: Never true  Transportation Needs: No Transportation Needs (05/20/2021)   PRAPARE - Administrator, Civil Service (Medical): No    Lack of Transportation (Non-Medical): No  Physical Activity: Sufficiently Active (05/20/2021)   Exercise Vital Sign    Days of Exercise per Week: 5 days    Minutes of Exercise per Session: 60 min  Stress: No Stress Concern Present (05/20/2021)   [employer] of Occupational Health - Occupational Stress Questionnaire    Feeling of Stress : Not at all  Social Connections: Moderately Isolated (05/20/2021)   Social Connection and Isolation Panel    Frequency of Communication with Friends and Family: More than three times a week    Frequency of Social Gatherings with Friends and Family: More than three times a week    Attends Religious Services: Never    Database Administrator or Organizations: No    Attends Banker Meetings: Never    Marital Status: Married  Catering Manager Violence: Not At Risk (05/20/2021)   Humiliation, Afraid, Rape, and Kick questionnaire    Fear of Current or Ex-Partner: No    Emotionally Abused: No    Physically Abused: No    Sexually Abused: No    Review of Systems: General: Negative for fever, chills, fatigue, weakness. Eyes: Negative for vision changes.  ENT: Negative for  hoarseness, difficulty swallowing , nasal congestion. CV: Negative for chest pain, angina, palpitations, dyspnea on exertion, peripheral edema.  Respiratory: Negative for dyspnea at rest, dyspnea on exertion, cough, sputum, wheezing.  GI: See history of present illness. GU:  Negative for dysuria, hematuria, urinary incontinence, urinary frequency, nocturnal urination.  MS: Negative for joint pain, low back pain.  Derm: Negative for rash or itching.  Neuro: Negative for weakness, abnormal sensation, seizure, frequent headaches, memory loss, confusion.  Psych: Negative for anxiety, depression Endo: Negative for unusual weight change.  Heme: Negative for bruising or bleeding. Allergy: Negative for rash or hives.  Physical Exam: Vital signs in last 24 hours: Temp:  [98.3 F (36.8 C)] 98.3 F (36.8 C) (11/10 0933) Pulse Rate:  [81] 81 (11/10 0933) Resp:  [20] 20 (11/10 0933) BP: (140)/(93) 140/93 (11/10 0933) SpO2:  [99 %] 99 % (11/10 0933)  Weight:  [71.2 kg] 71.2 kg (11/10 0933)   General:   Alert,  Well-developed, well-nourished, pleasant and cooperative in NAD Head:  Normocephalic and atraumatic. Eyes:  Sclera clear, no icterus.   Conjunctiva pink. Ears:  Normal auditory acuity. Nose:  No deformity, discharge,  or lesions. Msk:  Symmetrical without gross deformities. Normal posture. Extremities:  Without clubbing or edema. Neurologic:  Alert and  oriented x4;  grossly normal neurologically. Skin:  Intact without significant lesions or rashes. Psych:  Alert and cooperative. Normal mood and affect.   Impression/Plan: Steven Martinez is here for an EGD to be performed for history of gastric adenocarcinoma, abnormal CT scan of the stomach.   Risks, benefits, limitations, imponderables and alternatives regarding procedure have been reviewed with the patient. Questions have been answered. All parties agreeable.

## 2024-07-31 NOTE — Transfer of Care (Signed)
 Immediate Anesthesia Transfer of Care Note  Patient: Steven Martinez  Procedure(s) Performed: EGD (ESOPHAGOGASTRODUODENOSCOPY)  Patient Location: Short Stay  Anesthesia Type:General  Level of Consciousness: drowsy  Airway & Oxygen Therapy: Patient Spontanous Breathing  Post-op Assessment: Report given to RN and Post -op Vital signs reviewed and stable  Post vital signs: Reviewed and stable  Last Vitals:  Vitals Value Taken Time  BP 83/48 07/31/24 10:47  Temp 36.7 C 07/31/24 10:47  Pulse 59 07/31/24 10:47  Resp 18 07/31/24 10:47  SpO2 95 % 07/31/24 10:47    Last Pain:  Vitals:   07/31/24 1047  TempSrc: Oral  PainSc: Asleep      Patients Stated Pain Goal: 6 (07/31/24 0933)  Complications: No notable events documented.

## 2024-07-31 NOTE — Op Note (Signed)
 [facility] Patient Name: Steven Martinez Procedure Date: 07/31/2024 10:09 AM MRN: [id number] Date of Birth: January 09, 1991 Attending MD: Carlin POUR. Cindie , [place], [id number] CSN: [id number] Age: 33 Admit Type: Outpatient Procedure:                Upper GI endoscopy Indications:              Follow-up of malignant adenocarcinoma of the                            stomach, Abnormal CT of the GI tract Providers:                Carlin POUR. Cindie, DO, Devere Lodge, Chad Wilson,                            Technician Referring MD:             Carlin POUR. Cindie, DO Medicines:                See the Anesthesia note for documentation of the                            administered medications Complications:            No immediate complications. Estimated Blood Loss:     Estimated blood loss was minimal. Procedure:                Pre-Anesthesia Assessment:                           - The anesthesia plan was to use monitored                            anesthesia care (MAC).                           After obtaining informed consent, the endoscope was                            passed under direct vision. Throughout the                            procedure, the patient's blood pressure, pulse, and                            oxygen saturations were monitored continuously. The                            [id number] ([id number]) scope was introduced through                            the mouth, and advanced to the second part of                            duodenum. The upper GI endoscopy was accomplished  without difficulty. The patient tolerated the                            procedure well. Scope In: 10:23:17 AM Scope Out: 10:40:41 AM Total Procedure Duration: 0 hours 17 minutes 24 seconds  Findings:      A medium-sized hiatal hernia was present.      LA Grade C (one or more mucosal breaks continuous between tops of 2 or       more mucosal folds, less than 75%  circumference) esophagitis with no       bleeding was found in the lower third of the esophagus.      Evidence of an antrectomy was found in the gastric antrum. This was       characterized by healthy appearing mucosa. Biopsies were taken with a       cold forceps for histology.      A single 10 mm submucosal papule (nodule) with no bleeding and no       stigmata of recent bleeding was found at the anastomosis. Biopsies were       taken with a cold forceps for histology.      The duodenal bulb, first portion of the duodenum and second portion of       the duodenum were normal. Impression:               - Medium-sized hiatal hernia.                           - LA Grade C reflux esophagitis with no bleeding.                           - An antrectomy was found, characterized by healthy                            appearing mucosa. Biopsied.                           - A single submucosal papule (nodule) found in the                            stomach. Biopsied.                           - Normal duodenal bulb, first portion of the                            duodenum and second portion of the duodenum. Moderate Sedation:      Per Anesthesia Care Recommendation:           - Patient has a contact number available for                            emergencies. The signs and symptoms of potential                            delayed complications were discussed with the  patient. Return to normal activities tomorrow.                            Written discharge instructions were provided to the                            patient.                           - Resume previous diet.                           - Continue present medications.                           - Await pathology results.                           - Repeat upper endoscopy for surveillance based on                            pathology results.                           - Discussed case with Dr Mavis who  also reviewed                            CT imaging. If biopsies negative, consider repeat                            CT in 3 months. I have also reached out to oncology                            about role of possible PET scan. Procedure Code(s):        --- Professional ---                           [phone], Esophagogastroduodenoscopy, flexible,                            transoral; with biopsy, single or multiple Diagnosis Code(s):        --- Professional ---                           K44.9, Diaphragmatic hernia without obstruction or                            gangrene                           K21.00, Gastro-esophageal reflux disease with                            esophagitis, without bleeding                           Z90.3, Acquired absence of stomach [  part of]                           K31.89, Other diseases of stomach and duodenum                           C16.9, Malignant neoplasm of stomach, unspecified                           R93.3, Abnormal findings on diagnostic imaging of                            other parts of digestive tract CPT copyright 2022 American [employer] Association. All rights reserved. The codes documented in this report are preliminary and upon coder review may  be revised to meet current compliance requirements. Carlin POUR. Cindie, DO Carlin POUR. Zanai Mallari, DO 07/31/2024 10:50:55 AM This report has been signed electronically. Number of Addenda: 0

## 2024-07-31 NOTE — Discharge Instructions (Addendum)
 EGD Discharge instructions Please read the instructions outlined below and refer to this sheet in the next few weeks. These discharge instructions provide you with general information on caring for yourself after you leave the hospital. Your doctor may also give you specific instructions. While your treatment has been planned according to the most current medical practices available, unavoidable complications occasionally occur. If you have any problems or questions after discharge, please call your doctor. ACTIVITY You may resume your regular activity but move at a slower pace for the next 24 hours.  Take frequent rest periods for the next 24 hours.  Walking will help expel (get rid of) the air and reduce the bloated feeling in your abdomen.  No driving for 24 hours (because of the anesthesia (medicine) used during the test).  You may shower.  Do not sign any important legal documents or operate any machinery for 24 hours (because of the anesthesia used during the test).  NUTRITION Drink plenty of fluids.  You may resume your normal diet.  Begin with a light meal and progress to your normal diet.  Avoid alcoholic beverages for 24 hours or as instructed by your caregiver.  MEDICATIONS You may resume your normal medications unless your caregiver tells you otherwise.  WHAT YOU CAN EXPECT TODAY You may experience abdominal discomfort such as a feeling of fullness or "gas" pains.  FOLLOW-UP Your doctor will discuss the results of your test with you.  SEEK IMMEDIATE MEDICAL ATTENTION IF ANY OF THE FOLLOWING OCCUR: Excessive nausea (feeling sick to your stomach) and/or vomiting.  Severe abdominal pain and distention (swelling).  Trouble swallowing.  Temperature over 101 F (37.8 C).  Rectal bleeding or vomiting of blood.   Your upper endoscopy again revealed hiatal hernia, evidence of acid reflux changes.  The anastomosis overall looked healthy.  I took numerous biopsies of this area.  There  does appear to be a potential nodule in the area though this could certainly be related to scar tissue from prior surgery.  I took numerous biopsies of this area as well.  I discussed case with Dr. Mavis (general surgeon).  Who also looked at the CT images as well as was present for your upper endoscopy.  I have reached out to your oncologist about possible role of repeat PET scan versus repeat CT in 3 months.  Await pathology results to determine next steps, my office will contact you.  I hope you have a great rest of your week!  Carlin POUR. Cindie, D.O. Gastroenterology and Hepatology [facility]

## 2024-07-31 NOTE — Anesthesia Preprocedure Evaluation (Signed)
 Anesthesia Evaluation  Patient identified by MRN, date of birth, ID band Patient awake    Reviewed: Allergy & Precautions, H&P , NPO status , Patient's Chart, lab work & pertinent test results, reviewed documented beta blocker date and time   Airway Mallampati: II  TM Distance: >3 FB Neck ROM: full    Dental no notable dental hx.    Pulmonary neg pulmonary ROS   Pulmonary exam normal breath sounds clear to auscultation       Cardiovascular Exercise Tolerance: Good hypertension, negative cardio ROS  Rhythm:regular Rate:Normal     Neuro/Psych  PSYCHIATRIC DISORDERS      negative neurological ROS     GI/Hepatic Neg liver ROS, PUD,GERD  ,,  Endo/Other  negative endocrine ROS    Renal/GU negative Renal ROS  negative genitourinary   Musculoskeletal   Abdominal   Peds  Hematology  (+) Blood dyscrasia, anemia   Anesthesia Other Findings   Reproductive/Obstetrics negative OB ROS                              Anesthesia Physical Anesthesia Plan  ASA: 2  Anesthesia Plan: MAC   Post-op Pain Management:    Induction:   PONV Risk Score and Plan: Propofol  infusion  Airway Management Planned:   Additional Equipment:   Intra-op Plan:   Post-operative Plan:   Informed Consent: I have reviewed the patients History and Physical, chart, labs and discussed the procedure including the risks, benefits and alternatives for the proposed anesthesia with the patient or authorized representative who has indicated his/her understanding and acceptance.     Dental Advisory Given  Plan Discussed with: CRNA  Anesthesia Plan Comments:         Anesthesia Quick Evaluation

## 2024-07-31 NOTE — Progress Notes (Signed)
 Please excuse Steven Martinez from work today due to spouse having a procedure at [facility]  Steven Martinez, [place]

## 2024-08-01 LAB — SURGICAL PATHOLOGY

## 2024-08-01 NOTE — Anesthesia Postprocedure Evaluation (Signed)
 Anesthesia Post Note  Patient: Steven Martinez  Procedure(s) Performed: EGD (ESOPHAGOGASTRODUODENOSCOPY)  Patient location during evaluation: Phase II Anesthesia Type: MAC Level of consciousness: awake Pain management: pain level controlled Vital Signs Assessment: post-procedure vital signs reviewed and stable Respiratory status: spontaneous breathing and respiratory function stable Cardiovascular status: blood pressure returned to baseline and stable Postop Assessment: no headache and no apparent nausea or vomiting Anesthetic complications: no Comments: Late entry   No notable events documented.   Last Vitals:  Vitals:   07/31/24 1047 07/31/24 1050  BP: (!) 83/48 99/73  Pulse: (!) 59 (!) 57  Resp: 18   Temp: 36.7 C   SpO2: 95% 97%    Last Pain:  Vitals:   07/31/24 1050  TempSrc:   PainSc: 0-No pain                 Yvonna JINNY Bosworth

## 2024-08-02 ENCOUNTER — Encounter (HOSPITAL_COMMUNITY): Payer: Self-pay | Admitting: Internal Medicine

## 2024-08-22 ENCOUNTER — Other Ambulatory Visit: Payer: Self-pay

## 2024-08-22 DIAGNOSIS — C169 Malignant neoplasm of stomach, unspecified: Secondary | ICD-10-CM

## 2024-09-18 ENCOUNTER — Encounter: Payer: Self-pay | Admitting: *Deleted

## 2024-09-28 ENCOUNTER — Inpatient Hospital Stay: Payer: Self-pay | Attending: Oncology

## 2024-09-28 ENCOUNTER — Ambulatory Visit (HOSPITAL_COMMUNITY)
Admission: RE | Admit: 2024-09-28 | Discharge: 2024-09-28 | Disposition: A | Payer: Self-pay | Source: Ambulatory Visit | Attending: Oncology

## 2024-09-28 DIAGNOSIS — D509 Iron deficiency anemia, unspecified: Secondary | ICD-10-CM

## 2024-09-28 DIAGNOSIS — C169 Malignant neoplasm of stomach, unspecified: Secondary | ICD-10-CM

## 2024-09-28 LAB — CBC WITH DIFFERENTIAL/PLATELET
Abs Immature Granulocytes: 0.01 K/uL (ref 0.00–0.07)
Basophils Absolute: 0 K/uL (ref 0.0–0.1)
Basophils Relative: 0 %
Eosinophils Absolute: 0.1 K/uL (ref 0.0–0.5)
Eosinophils Relative: 1 %
HCT: 47.9 % (ref 39.0–52.0)
Hemoglobin: 16.1 g/dL (ref 13.0–17.0)
Immature Granulocytes: 0 %
Lymphocytes Relative: 27 %
Lymphs Abs: 1.3 K/uL (ref 0.7–4.0)
MCH: 28.1 pg (ref 26.0–34.0)
MCHC: 33.6 g/dL (ref 30.0–36.0)
MCV: 83.7 fL (ref 80.0–100.0)
Monocytes Absolute: 0.4 K/uL (ref 0.1–1.0)
Monocytes Relative: 8 %
Neutro Abs: 2.9 K/uL (ref 1.7–7.7)
Neutrophils Relative %: 64 %
Platelets: 161 K/uL (ref 150–400)
RBC: 5.72 MIL/uL (ref 4.22–5.81)
RDW: 12.7 % (ref 11.5–15.5)
WBC: 4.6 K/uL (ref 4.0–10.5)
nRBC: 0 % (ref 0.0–0.2)

## 2024-09-28 LAB — COMPREHENSIVE METABOLIC PANEL WITH GFR
ALT: 69 U/L — ABNORMAL HIGH (ref 0–44)
AST: 38 U/L (ref 15–41)
Albumin: 4.5 g/dL (ref 3.5–5.0)
Alkaline Phosphatase: 102 U/L (ref 38–126)
Anion gap: 17 — ABNORMAL HIGH (ref 5–15)
BUN: 12 mg/dL (ref 6–20)
CO2: 23 mmol/L (ref 22–32)
Calcium: 9.4 mg/dL (ref 8.9–10.3)
Chloride: 102 mmol/L (ref 98–111)
Creatinine, Ser: 0.81 mg/dL (ref 0.61–1.24)
GFR, Estimated: >60 mL/min
Glucose, Bld: 107 mg/dL — ABNORMAL HIGH (ref 70–99)
Potassium: 4.5 mmol/L (ref 3.5–5.1)
Sodium: 141 mmol/L (ref 135–145)
Total Bilirubin: 0.4 mg/dL (ref 0.0–1.2)
Total Protein: 8.1 g/dL (ref 6.5–8.1)

## 2024-09-28 LAB — IRON AND TIBC
Iron: 78 ug/dL (ref 45–182)
Saturation Ratios: 28 % (ref 17.9–39.5)
TIBC: 283 ug/dL (ref 250–450)
UIBC: 205 ug/dL

## 2024-09-28 LAB — FERRITIN: Ferritin: 324 ng/mL (ref 24–336)

## 2024-09-28 LAB — VITAMIN B12: Vitamin B-12: 1210 pg/mL — ABNORMAL HIGH (ref 180–914)

## 2024-09-28 MED ORDER — FLUDEOXYGLUCOSE F - 18 (FDG) INJECTION
8.5800 | Freq: Once | INTRAVENOUS | Status: AC | PRN
  Administered 2024-09-28: 8.58 via INTRAVENOUS

## 2024-09-29 LAB — CEA: CEA: 2 ng/mL (ref 0.0–4.7)

## 2024-10-02 ENCOUNTER — Ambulatory Visit: Payer: Self-pay | Admitting: Oncology

## 2024-10-02 NOTE — Telephone Encounter (Signed)
 See response.

## 2024-10-04 NOTE — Progress Notes (Signed)
 " Patient Care Team: Bucio, Silvio BROCKS, FNP as PCP - General (Family Medicine) Cindie Carlin POUR, DO as Consulting Physician (Gastroenterology) Celestia Joesph SQUIBB, RN as Oncology Nurse Navigator (Oncology) Billy Wheeler LABOR, RN as Registered Nurse (Oncology)  Clinic Day:  10/06/2024  Referring physician: Alston Silvio BROCKS, FNP   CHIEF COMPLAINT:  CC: Stage III (uT3 N1) gastric adenocarcinoma   Steven Martinez 34 y.o. male was transferred to my care after his prior physician has left.   ASSESSMENT & PLAN:   Assessment & Plan: Steven Martinez  is a 34 y.o. male with gastric adenocarcinoma  Assessment and Plan  Gastric adenocarcinoma Extensive oncology history below Patient on surveillance after neoadjuvant chemo with FLOT and R0 resection in 2023 Recent EGD with esophagitis and nodule, biopsy of both showed no carcinoma  -We reviewed the PET scan findings together. Focal segmental small-bowel hypermetabolism in the ventral left lower quadrant, indeterminate. Consider correlation with CT enterography in further evaluation, as clinically indicated. Hypermetabolic cervical and mediastinal lymph nodes may be reactive in etiology. Metastatic disease cannot be excluded. - Will repeat a PET scan in  6 weeks. If the small bowel hypermetabolism is persistent, will schedule for CT enterography. - The mediastinal and cervical uptake is likely secondary to inflammation from recent upper respiratory tract infection - Will review images at multidisciplinary tumor board -Continue to follow with GI  Return to clinic in 6 weeks with a PET scan  The patient understands the plans discussed today and is in agreement with them.  He knows to contact our office if he develops concerns prior to his next appointment.  40 minutes of total time was spent for this patient encounter, including preparation,review of records,  face-to-face counseling with the patient and coordination of care, physical exam, and  documentation of the encounter.    Steven Martinez,acting as a neurosurgeon for Steven Dry, MD.,have documented all relevant documentation on the behalf of Steven Dry, MD,as directed by  Steven Dry, MD while in the presence of Steven Dry, MD.  I, Steven Dry MD, have reviewed the above documentation for accuracy and completeness, and I agree with the above.    Steven Dry, MD  [facility] - A DEPT OF [facility] [address] [place] [address] Dept: [phone] Dept Fax: [phone]   Orders Placed This Encounter  Procedures   NM PET Image Restag (PS) Skull Base To Thigh    Standing Status:   Future    Expected Date:   11/05/2024    Expiration Date:   10/05/2025    If indicated for the ordered procedure, I authorize the administration of a radiopharmaceutical per Radiology protocol:   Yes    Preferred imaging location?:   [facility]     ONCOLOGY HISTORY:   I have reviewed his chart and materials related to his cancer extensively and collaborated history with the patient. Summary of oncologic history is as follows:   Diagnosis: Stage III (uT3 N1) gastric adenocarcinoma   -05/12/2021: EGD found a large cratered ulcer in the gastric antrum.  Pathology: Adenocarcinoma, at least intramucosal. The depth of invasion cannot be judged in these superficial biopsies.  -05/14/2021: CT CAP:  Gastric antral mass, likely corresponding to the endoscopic finding. Upper normal sized portacaval node and prominent anterior perigastric node are technically indeterminate. Especially the perigastric node is suspicious, based on location. No evidence of distant metastatic disease within the abdomen  or pelvis. Isolated 4 mm right middle lobe pulmonary nodule is most likely incidental/benign. Isolated pulmonary metastasis felt less likely. -05/29/2021: Initial PET: Mass within the gastric antrum is FDG avid within  SUV max of 5.32 compatible with primary gastric adenocarcinoma. No definite evidence for FDG avid nodal metastasis or distant metastatic disease. There is only low-level FDG uptake associated with the previously described prominent upper abdominal lymph nodes, as described above. -06/19/2021: EGD/EUS found a uT3N1 4cm crated, non- circumferential distal gastric adenocarcinoma. -06/25/2021 - 10/03/2021: neoadjuvant FLOT -07/23/2021: CEA 6.0 -11/03/2021: Partial gastrectomy and D1 dissection.  Pathology: Residual moderately differentiated adenocarcinoma status post neoadjuvant therapy, 1.9 cm. Tumor invades muscularis propria. No carcinoma identified in nineteen lymph nodes (0/19). Margins uninvolved by carcinoma. Staging: ypT2, ypN0  -12/02/2021: CEA 1.7 -01/2022 - 06/2024: Patient's imaging showing NED -06/27/2024: CT AP: Postsurgical change of prior partial gastrectomy with gastrojejunal anastomosis. New soft tissue nodularity along the inferior margin of the suture line, nonspecific. Suggest further evaluation with endoscopy. Small bowel small bowel intussusception just downstream from the suture line, in adult patients this is most commonly transient. However given patient's history would suggest attention on short-term interval follow-up imaging and assessment at upper endoscopy if performed. -07/31/2024: Upper endoscopy: LA grade C esophagitis with no bleeding. A single submucosal nodule found in stomach- biopsied. All biopsies negative for carcinoma.  -PET: Focal segmental small-bowel hypermetabolism in the ventral left lower quadrant, indeterminate. Hypermetabolic cervical and mediastinal lymph nodes may be reactive in etiology. Metastatic disease cannot be excluded.  Current Treatment:  Surveillance  INTERVAL HISTORY:   Steven Martinez is here today for follow up and to establish care with me for gastric adenocarcinoma. Patient is accompanied by his wife.  Steven Martinez is doing well overall. He  notes mild left-mid abdominal pain. His appetite is normal. He denies any vomiting.   We discussed PET scan results and Steven Martinez states he had an upper respiratory tract infection last week.   Steven Martinez has elevated vitamin B 12 levels and is not taking B 12 supplements. He is not eating a large amount of red meat.   Steven Martinez lives in [place], [place] with his wife and 3 kids.   I have reviewed the past medical history, past surgical history, social history and family history with the patient and they are unchanged from previous note.  ALLERGIES:  is allergic to oxaliplatin .  MEDICATIONS:  No current outpatient medications on file.   No current facility-administered medications for this visit.    VITALS:  Blood pressure 113/88, pulse 71, temperature 98.1 F (36.7 C), temperature source Oral, resp. rate 18, weight 157 lb 3 oz (71.3 kg), SpO2 99%.  Wt Readings from Last 3 Encounters:  10/05/24 157 lb 3 oz (71.3 kg)  07/31/24 157 lb (71.2 kg)  07/05/24 157 lb (71.2 kg)    Body mass index is 25.37 kg/m.  Performance status (ECOG): 0 - Asymptomatic  PHYSICAL EXAM:   GENERAL:alert, no distress and comfortable SKIN: skin color, texture, turgor are normal, no rashes or significant lesions LUNGS: clear to auscultation and percussion with normal breathing effort HEART: regular rate & rhythm and no murmurs and no lower extremity edema ABDOMEN:abdomen soft, non-tender and normal bowel sounds Musculoskeletal:no cyanosis of digits and no clubbing  NEURO: alert & oriented x 3 with fluent speech  LABORATORY DATA:  I have reviewed the data as listed   Lab Results  Component Value Date   WBC 4.6 09/28/2024   NEUTROABS 2.9 09/28/2024   HGB  16.1 09/28/2024   HCT 47.9 09/28/2024   MCV 83.7 09/28/2024   PLT 161 09/28/2024      Chemistry      Component Value Date/Time   NA 141 09/28/2024 0935   K 4.5 09/28/2024 0935   CL 102 09/28/2024 0935   CO2 23 09/28/2024 0935   BUN 12 09/28/2024 0935    CREATININE 0.81 09/28/2024 0935      Component Value Date/Time   CALCIUM  9.4 09/28/2024 0935   CALCIUM  8.2 (L) 10/14/2020 0259   ALKPHOS 102 09/28/2024 0935   AST 38 09/28/2024 0935   ALT 69 (H) 09/28/2024 0935   BILITOT 0.4 09/28/2024 0935       Latest Reference Range & Units 09/28/24 09:35  Iron 45 - 182 ug/dL 78  UIBC ug/dL 794  TIBC 749 - 549 ug/dL 716  Saturation Ratios 17.9 - 39.5 % 28  Ferritin 24 - 336 ng/mL 324  Vitamin B12 180 - 914 pg/mL 1,210 (H)  (H): Data is abnormally high   Latest Reference Range & Units 09/28/24 09:35  CEA 0.0 - 4.7 ng/mL 2.0   RADIOGRAPHIC STUDIES: I have personally reviewed the radiological images as listed and agreed with the findings in the report.  NM PET Image Restag (PS) Skull Base To Thigh CLINICAL DATA:  Subsequent treatment strategy for small-bowel cancer, gastric adenocarcinoma.  EXAM: NUCLEAR MEDICINE PET SKULL BASE TO THIGH  TECHNIQUE: 8.6 mCi F-18 FDG was injected intravenously. Full-ring PET imaging was performed from the skull base to thigh after the radiotracer. CT data was obtained and used for attenuation correction and anatomic localization.  Fasting blood glucose: 107 mg/dl  COMPARISON:  CT abdomen pelvis 06/27/2024, CT chest abdomen pelvis 12/21/2023 and PET 05/29/2021.  FINDINGS: Mediastinal blood pool activity: SUV max 2.3  Liver activity: SUV max NA  NECK:  Small level II lymph nodes measure up to 5 mm on the right (202/23), SUV max 3.3. No additional abnormal hypermetabolism.  Incidental CT findings:  Near complete opacification of the left maxillary sinus.  CHEST:  Hypermetabolic mediastinal/subcarinal and possibly right hilar lymph nodes. Index low right paratracheal lymph node measures 8 mm, SUV max 4.5. No additional abnormal hypermetabolism.  Incidental CT findings:  Heart is at the upper limits of normal in size to mildly enlarged. No pericardial or pleural  effusion.  ABDOMEN/PELVIS:  Focal segmental small-bowel hypermetabolism in the ventral left lower quadrant (202/113), SUV max 5.2. Correlation with CT is challenging due to lack of IV and oral contrast and poor distension. No additional abnormal hypermetabolism.  Incidental CT findings:  Small hiatal hernia. Partial gastrectomy with a gastrojejunal anastomosis.  SKELETON:  No abnormal hypermetabolism.  Incidental CT findings:  None.  IMPRESSION: 1. Focal segmental small-bowel hypermetabolism in the ventral left lower quadrant, indeterminate. Consider correlation with CT enterography in further evaluation, as clinically indicated. 2. Hypermetabolic cervical and mediastinal lymph nodes may be reactive in etiology. Metastatic disease cannot be excluded.  Electronically Signed   By: Newell Eke M.D.   On: 10/04/2024 13:12    "

## 2024-10-05 ENCOUNTER — Inpatient Hospital Stay (HOSPITAL_BASED_OUTPATIENT_CLINIC_OR_DEPARTMENT_OTHER): Payer: Self-pay | Admitting: Oncology

## 2024-10-05 VITALS — BP 113/88 | HR 71 | Temp 98.1°F | Resp 18 | Wt 157.2 lb

## 2024-10-05 DIAGNOSIS — C169 Malignant neoplasm of stomach, unspecified: Secondary | ICD-10-CM

## 2024-10-05 NOTE — Patient Instructions (Addendum)
 [facility] at [facility] Discharge Instructions   You were seen and examined today by Dr. Davonna.  She reviewed the results of your lab work which are normal/stable.   She reviewed the results of your PET scan. There is an area of concern in the small bowel that is lighting up on the scan. There are also some lymph nodes that are lighting up on the scan, although these may be due to inflammation/infection.   We will see you back in 6 weeks. We will repeat a PET scan prior to this visit.    Return as scheduled.    Thank you for choosing [facility] at [facility] to provide your oncology and hematology care.  To afford each patient quality time with our provider, please arrive at least 15 minutes before your scheduled appointment time.   If you have a lab appointment with the Cancer Center please come in thru the Main Entrance and check in at the main information desk.  You need to re-schedule your appointment should you arrive 10 or more minutes late.  We strive to give you quality time with our providers, and arriving late affects you and other patients whose appointments are after yours.  Also, if you no show three or more times for appointments you may be dismissed from the clinic at the providers discretion.     Again, thank you for choosing [facility].  Our hope is that these requests will decrease the amount of time that you wait before being seen by our physicians.       _____________________________________________________________  Should you have questions after your visit to [facility], please contact our office at [phone] and follow the prompts.  Our office hours are 8:00 a.m. and 4:30 p.m. Monday - Friday.  Please note that voicemails left after 4:00 p.m. may not be returned until the following business day.  We are closed weekends and major holidays.  You do have access to a nurse 24-7, just call the  main number to the clinic [phone] and do not press any options, hold on the line and a nurse will answer the phone.    For prescription refill requests, have your pharmacy contact our office and allow 72 hours.    Due to Covid, you will need to wear a mask upon entering the hospital. If you do not have a mask, a mask will be given to you at the Main Entrance upon arrival. For doctor visits, patients may have 1 support person age 21 or older with them. For treatment visits, patients can not have anyone with them due to social distancing guidelines and our immunocompromised population.

## 2024-10-06 ENCOUNTER — Encounter (HOSPITAL_COMMUNITY): Payer: Self-pay | Admitting: Oncology

## 2024-10-11 ENCOUNTER — Other Ambulatory Visit: Payer: Self-pay | Admitting: *Deleted

## 2024-10-11 ENCOUNTER — Other Ambulatory Visit: Payer: Self-pay

## 2024-10-11 DIAGNOSIS — R9389 Abnormal findings on diagnostic imaging of other specified body structures: Secondary | ICD-10-CM

## 2024-10-12 ENCOUNTER — Encounter (HOSPITAL_COMMUNITY): Payer: Self-pay | Admitting: Oncology

## 2024-10-12 NOTE — Progress Notes (Signed)
 The proposed treatment discussed in conference is for discussion purpose only and is not a binding recommendation.  The patients have not been physically examined, or presented with their treatment options.  Therefore, final treatment plans cannot be decided.

## 2024-10-25 ENCOUNTER — Ambulatory Visit (HOSPITAL_COMMUNITY): Admission: RE | Admit: 2024-10-25 | Discharge: 2024-10-25 | Payer: Self-pay | Attending: Oncology

## 2024-10-25 DIAGNOSIS — R9389 Abnormal findings on diagnostic imaging of other specified body structures: Secondary | ICD-10-CM

## 2024-10-25 MED ORDER — IOHEXOL 300 MG/ML  SOLN
100.0000 mL | Freq: Once | INTRAMUSCULAR | Status: AC | PRN
  Administered 2024-10-25: 100 mL via INTRAVENOUS

## 2024-11-01 ENCOUNTER — Inpatient Hospital Stay: Payer: Self-pay | Attending: Oncology | Admitting: Oncology

## 2024-11-09 ENCOUNTER — Other Ambulatory Visit (HOSPITAL_COMMUNITY): Payer: Self-pay

## 2024-11-15 ENCOUNTER — Inpatient Hospital Stay: Payer: Self-pay | Admitting: Oncology

## 2024-11-30 ENCOUNTER — Inpatient Hospital Stay: Payer: Self-pay | Admitting: Oncology

## 2024-12-27 ENCOUNTER — Inpatient Hospital Stay: Payer: Self-pay

## 2024-12-27 ENCOUNTER — Ambulatory Visit (HOSPITAL_COMMUNITY): Payer: Self-pay

## 2025-01-03 ENCOUNTER — Inpatient Hospital Stay: Payer: Self-pay | Admitting: Oncology
